# Patient Record
Sex: Male | Born: 1965 | Race: White | Hispanic: No | Smoking: Never smoker
Health system: Southern US, Community
[De-identification: ages and names within clinical notes are randomized; demographics above are authoritative.]

## PROBLEM LIST (undated history)

## (undated) DIAGNOSIS — S43432A Superior glenoid labrum lesion of left shoulder, initial encounter: Secondary | ICD-10-CM

## (undated) DIAGNOSIS — F429 Obsessive-compulsive disorder, unspecified: Secondary | ICD-10-CM

## (undated) DIAGNOSIS — F329 Major depressive disorder, single episode, unspecified: Secondary | ICD-10-CM

## (undated) DIAGNOSIS — F32A Depression, unspecified: Secondary | ICD-10-CM

## (undated) DIAGNOSIS — K5792 Diverticulitis of intestine, part unspecified, without perforation or abscess without bleeding: Secondary | ICD-10-CM

## (undated) DIAGNOSIS — F419 Anxiety disorder, unspecified: Secondary | ICD-10-CM

## (undated) DIAGNOSIS — T7840XA Allergy, unspecified, initial encounter: Secondary | ICD-10-CM

## (undated) HISTORY — DX: Obsessive-compulsive disorder, unspecified: F42.9

## (undated) HISTORY — DX: Depression, unspecified: F32.A

## (undated) HISTORY — PX: COLONOSCOPY: SHX174

## (undated) HISTORY — DX: Allergy, unspecified, initial encounter: T78.40XA

## (undated) HISTORY — DX: Anxiety disorder, unspecified: F41.9

## (undated) HISTORY — DX: Major depressive disorder, single episode, unspecified: F32.9

---
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
## 2007-02-17 ENCOUNTER — Ambulatory Visit: Payer: Self-pay | Admitting: Internal Medicine

## 2007-02-17 ENCOUNTER — Inpatient Hospital Stay (HOSPITAL_COMMUNITY): Admission: EM | Admit: 2007-02-17 | Discharge: 2007-02-19 | Payer: Self-pay | Admitting: Emergency Medicine

## 2007-07-13 HISTORY — PX: PARTIAL COLECTOMY: SHX5273

## 2007-09-11 ENCOUNTER — Emergency Department (HOSPITAL_COMMUNITY): Admission: EM | Admit: 2007-09-11 | Discharge: 2007-09-11 | Payer: Self-pay | Admitting: Family Medicine

## 2008-07-29 ENCOUNTER — Ambulatory Visit (HOSPITAL_COMMUNITY): Admission: RE | Admit: 2008-07-29 | Discharge: 2008-07-29 | Payer: Self-pay | Admitting: Infectious Diseases

## 2010-05-04 ENCOUNTER — Ambulatory Visit: Payer: Self-pay | Admitting: Sports Medicine

## 2010-05-04 DIAGNOSIS — M722 Plantar fascial fibromatosis: Secondary | ICD-10-CM | POA: Insufficient documentation

## 2010-05-04 DIAGNOSIS — M79609 Pain in unspecified limb: Secondary | ICD-10-CM | POA: Insufficient documentation

## 2010-05-04 DIAGNOSIS — M2020 Hallux rigidus, unspecified foot: Secondary | ICD-10-CM | POA: Insufficient documentation

## 2010-06-09 ENCOUNTER — Ambulatory Visit: Payer: Self-pay | Admitting: Sports Medicine

## 2010-08-11 NOTE — Assessment & Plan Note (Signed)
Summary: f/u foot,mc   Vital Signs:  Patient profile:   45 year old male Pulse rate:   77 / minute BP sitting:   122 / 72  (right arm)  Vitals Entered By: Rochele Pages RN (June 09, 2010 8:40 AM) CC: f/u L foot - 80% improved   CC:  f/u L foot - 80% improved.  History of Present Illness: Pt reports to clinic for f/u of L plantar fascitis which he reports is 80% improved.  Reports that he is still wearing tennis shoes for work, wore the boot for several weeks.  Did not try sports insole orthotics.   Has been able to reduce ice baths, doing heel lift and drop exercises.  Had injection 05/18/10 by Dr. Luiz Blare, which helped with pain.    original injury was with soccer sharp pain scan showed some nodular change in membrane as well as chronic thickening   Preventive Screening-Counseling & Management  Alcohol-Tobacco     Smoking Status: never  Allergies: No Known Drug Allergies  Social History: Smoking Status:  never  Physical Exam  General:  Well-developed,well-nourished,in no acute distress; alert,appropriate and cooperative throughout examination Msk:  Moderate arch bilat no rear foot shift  only mild TTP at insertion of Left PF Very limited motion both great toes External rotation of right foot with running walking gait neutral leg lengths equal   MSK [place] Scan shows less edema still with thick PF at 0.6 which compares to 0.33 on RT nodule is stil present but somewhat less prominent     Impression & Recommendations:  Problem # 1:  PLANTAR FASCIITIS, LEFT (ICD-728.71)  His updated medication list for this problem includes:    Meloxicam 15 Mg Tabs (Meloxicam) .Marland Kitchen... 1 by mouth daily as needed pain   keep up exercises keep up stretches  OK to increase activity as tolerated to elliptical then TM and finally running  try insoles if comfortable - use them  reck 2 mos and repeat scan ck soccer cleats andd see if they need support  Problem # 2:  HALLUX  RIGIDUS (ICD-735.2) keep working on toe motion with toe walking  Complete Medication List: 1)  Meloxicam 15 Mg Tabs (Meloxicam) .Marland Kitchen.. 1 by mouth daily as needed pain  Patient Instructions: 1)  Try green sports orthotics in your shoes for arch support and cushion, if foot feels worse with insole ok to remove. 2)  Continue exercises and stretches once daily if possible during work days, try to do exercises twice daily when not working 3)  When doing weight exericses, be cautious with plantar flexion 4)  Try ellipitcal first, if this does not cause pain, may try treadmill 5)  Keep pain level at no more than 3/10 6)  Return for follow up in 6 weeks   Orders Added: 1)  Est. Patient Level III [54098]

## 2010-08-11 NOTE — Assessment & Plan Note (Signed)
Summary: RT FOOT/LEG INJURY,MC   Vital Signs:  Patient profile:   45 year old male Height:      70.5 inches Weight:      178 pounds Pulse rate:   61 / minute BP sitting:   118 / 72  (right arm)  Vitals Entered By: Rochele Pages RN (May 04, 2010 11:42 AM) CC: left foot pain- heel and arch plantar aspect    CC:  left foot pain- heel and arch plantar aspect .  History of Present Illness: Heel and arch pain. Starting 8 days ago following soccer pain with heel pain.  Felt pain with getting out of bed in the morning. Wore tennis shoes during the week at work and was feeling somewhat better by the weekend. However this weekend while playing soccer game felt a painful tearing sensation in his left arch and was unable to continue playing. Today has continued pain and is limping.   Current Problems (verified): 1)  Plantar Fasciitis, Left  (ICD-728.71) 2)  Heel Pain  (ICD-729.5)  Current Medications (verified): 1)  Meloxicam 15 Mg Tabs (Meloxicam) .Marland Kitchen.. 1 By Mouth Daily As Needed Pain  Allergies (verified): No Known Drug Allergies  Social History: ID physician at [employer]  Review of Systems  The patient denies anorexia, fever, weight loss, chest pain, and abdominal pain.    Physical Exam  General:  VS noted.  Well NAD Msk:  Left foot: Normal appearing.  TTP over medial heel and mid arch.  Hallux Rigidus with limited great toe flexion and extension BL.   Normal arch with standing. Favors the medial aspect of his foot with suppination.  Able to stand on toes normally.  Gait: Limps with suppination BL.  [place]: Left plantar fascia is 0.6cm thick with a hypoechoic nodule seen at the mid foot.    Impression & Recommendations:  Problem # 1:  PLANTAR FASCIITIS, LEFT (ICD-728.71) Assessment New Plan: Temporary orthotics with scaphoid pads, arch straps, ice, and stretching exercises.  Will also treat pain with mobic as needed. Will follow up in 4-6 weeks. Plan for perm orthotics in 4  weeks if improving.   Also note hallicus rigidus. Given stretching exercises.   His updated medication list for this problem includes:    Meloxicam 15 Mg Tabs (Meloxicam) .Marland Kitchen... 1 by mouth daily as needed pain  Problem # 2:  HALLUX RIGIDUS (ICD-735.2) will need to assess gait when he has less pain  at that point can see if hallux rigidus is changing running form  if so may need to consider first ray posting for orthotics  Complete Medication List: 1)  Meloxicam 15 Mg Tabs (Meloxicam) .Marland Kitchen.. 1 by mouth daily as needed pain  Other Orders: Foot Orthosis ( Arch Strap/Heel Cup) [phone]) Sports Insoles [phone])  Patient Instructions: 1)  Thank you for seeing me today. 2)  Do the exercises that we discussed. 3)  Mobic for 7-10 days then as needed 4)  Arch straps with insols 5)  Come back in 4-6 weeks for follow up. Bring you running and soccer shoes andc shorts.  If much better call ahead for custom orthotics.  Prescriptions: MELOXICAM 15 MG TABS (MELOXICAM) 1 by mouth daily as needed pain  #30 x 1   Entered by:   Clementeen Graham MD   Authorized by:   Enid Baas MD   Signed by:   Clementeen Graham MD on 05/04/2010   Method used:   Electronically to        [facility] (retail)       [address]       [address] Shipping/mailing       [place], [place]  [address]       Ph: [phone]       Fax: [phone]   RxID:   [id number]    Orders Added: 1)  Foot Orthosis ( Arch Strap/Heel Cup) [W4132] 2)  Sports Insoles [L3510] 3)  New Patient Level II [99202]  Appended Document: RT FOOT/LEG [facility]

## 2010-11-27 NOTE — Discharge Summary (Signed)
NAME:  [name] [name]           ACCOUNT NO.:  [id number]   MEDICAL RECORD NO.:  [id number]          PATIENT TYPE:  INP   LOCATION:  5531                         FACILITY:  [facility]   PHYSICIAN:  C. Ulyess Mort, M.D.DATE OF BIRTH:  08/04/1965   DATE OF ADMISSION:  02/16/2007  DATE OF DISCHARGE:  02/19/2007                               DISCHARGE SUMMARY   DISCHARGE DIAGNOSES:  1. Community-acquired pneumonia, probably secondary to atypical.  2. Recurrent diverticulitis, status post partial colectomy in 2005.  3. Resolved acute renal failure secondary to hydration.  4. Obsessive compulsive disorder/anxiety, stable.   CONSULTATIONS:  ID, Dr. Payton Emerald, as well as Dr. Orvan Falconer.   DISCHARGE MEDICATIONS:  1. Avelox 400 mg once daily times 10 days.  2. Phenergan 25 mg 1 tablet every q.6h. p.r.n. for nausea.   HISTORY OF PRESENT ILLNESS:  The patient is a [name] [name] is a 45 year old  Caucasian male with a history of OCD as well as recurrent  diverticulitis, who came in complaining of generalized malaise and  fever.  He has been constipated and started having left lower quadrant  pain.  He did have sick contact which was his daughter who had croup and  pneumonia.  At the time of admission his temperature is 100.6, heart  rate 97, BP 124/74, saturating 100% on room air. His creatinine was  slightly elevated at 1.5.  Chest x-ray demonstrated a right upper lobe  air-space disease most likely pneumonia.  CT scan was then obtained  which had no specific findings other than the consolidation in the RUL  c/w pneumonia.   HOSPITAL COURSE:  Community-acquired pneumonia, secondary to mostly  likely atypicals.  He was first treated with azithromycin with unasyn  for empiric treatment for potential diverticulitis.  ID was consulted  while he was in the hospital.  At the time of discharge his antibiotic  therapy was consolidated to Avelox 400 mg every 10 days.  His anxiety  was treated with Klonopin.  At  the time of discharge he was doing much  better with decreased abdominal pain and with less cough.  He was to  follow up in the ID clinic at his convenience.      Hollace Hayward, M.D.  Electronically Signed      C. Ulyess Mort, M.D.  Electronically Signed    TE/MEDQ  D:  05/17/2007  T:  05/18/2007  Job:  [id number]

## 2011-01-19 ENCOUNTER — Ambulatory Visit
Admission: RE | Admit: 2011-01-19 | Discharge: 2011-01-19 | Disposition: A | Discharge status: Home / Self Care | Payer: Commercial Managed Care - PPO | Source: Ambulatory Visit | Attending: Infectious Diseases | Admitting: Infectious Diseases

## 2011-01-19 ENCOUNTER — Other Ambulatory Visit: Payer: Self-pay | Admitting: Licensed Clinical Social Worker

## 2011-01-19 DIAGNOSIS — K5792 Diverticulitis of intestine, part unspecified, without perforation or abscess without bleeding: Secondary | ICD-10-CM

## 2011-01-19 MED ORDER — IOHEXOL 300 MG/ML  SOLN
100.0000 mL | Freq: Once | INTRAMUSCULAR | Status: AC | PRN
  Administered 2011-01-19: 100 mL via INTRAVENOUS

## 2011-01-20 ENCOUNTER — Other Ambulatory Visit: Payer: Self-pay | Admitting: Infectious Diseases

## 2011-01-20 ENCOUNTER — Other Ambulatory Visit: Payer: Self-pay | Admitting: *Deleted

## 2011-01-20 ENCOUNTER — Other Ambulatory Visit: Payer: Self-pay | Admitting: Licensed Clinical Social Worker

## 2011-01-20 ENCOUNTER — Other Ambulatory Visit (INDEPENDENT_AMBULATORY_CARE_PROVIDER_SITE_OTHER): Payer: 59 | Admitting: Infectious Diseases

## 2011-01-20 DIAGNOSIS — R509 Fever, unspecified: Secondary | ICD-10-CM

## 2011-01-20 LAB — CBC WITH DIFFERENTIAL/PLATELET
Basophils Absolute: 0 10*3/uL (ref 0.0–0.1)
Eosinophils Absolute: 0.1 10*3/uL (ref 0.0–0.7)
Eosinophils Relative: 2 % (ref 0–5)
MCH: 31.9 pg (ref 26.0–34.0)
MCHC: 35.9 g/dL (ref 30.0–36.0)
MCV: 88.7 fL (ref 78.0–100.0)
Platelets: 256 10*3/uL (ref 150–400)
RDW: 12 % (ref 11.5–15.5)

## 2011-01-20 LAB — COMPREHENSIVE METABOLIC PANEL
ALT: 22 U/L (ref 0–53)
AST: 24 U/L (ref 0–37)
BUN: 6 mg/dL (ref 6–23)
CO2: 25 mEq/L (ref 19–32)
Creat: 1.02 mg/dL (ref 0.50–1.35)
Total Bilirubin: 0.3 mg/dL (ref 0.3–1.2)

## 2011-01-20 LAB — AMYLASE: Amylase: 44 U/L (ref 0–105)

## 2011-01-27 LAB — CULTURE, BLOOD (SINGLE)
Organism ID, Bacteria: NO GROWTH
Organism ID, Bacteria: NO GROWTH

## 2011-04-05 LAB — CBC
MCV: 91.6
RBC: 4.12 — ABNORMAL LOW
WBC: 13.9 — ABNORMAL HIGH

## 2011-04-05 LAB — POCT I-STAT, CHEM 8
Chloride: 101
Creatinine, Ser: 1.5
Glucose, Bld: 111 — ABNORMAL HIGH
Potassium: 4.1

## 2011-04-05 LAB — INFLUENZA A AND B ANTIGEN (CONVERTED LAB)
Inflenza A Ag: NEGATIVE
Influenza B Ag: NEGATIVE

## 2011-04-05 LAB — DIFFERENTIAL
Lymphs Abs: 0.8
Monocytes Relative: 8
Neutro Abs: 11.9 — ABNORMAL HIGH
Neutrophils Relative %: 86 — ABNORMAL HIGH

## 2011-04-26 LAB — URINALYSIS, ROUTINE W REFLEX MICROSCOPIC
Glucose, UA: NEGATIVE
Nitrite: NEGATIVE
Protein, ur: NEGATIVE
Urobilinogen, UA: 0.2

## 2011-04-26 LAB — SODIUM, URINE, RANDOM: Sodium, Ur: 132

## 2011-04-26 LAB — CBC
HCT: 34.2 — ABNORMAL LOW
HCT: 38.3 — ABNORMAL LOW
Hemoglobin: 11.8 — ABNORMAL LOW
Hemoglobin: 12.7 — ABNORMAL LOW
Hemoglobin: 13.1
MCHC: 34.2
MCHC: 35
MCV: 90.7
MCV: 91.8
RBC: 3.72 — ABNORMAL LOW
RBC: 3.99 — ABNORMAL LOW
RDW: 13.2
WBC: 6.4
WBC: 7.5

## 2011-04-26 LAB — CULTURE, BLOOD (ROUTINE X 2)

## 2011-04-26 LAB — HEPATIC FUNCTION PANEL
ALT: 27
AST: 30
Albumin: 3.2 — ABNORMAL LOW
Alkaline Phosphatase: 43
Bilirubin, Direct: 0.1
Total Bilirubin: 0.8

## 2011-04-26 LAB — BASIC METABOLIC PANEL
BUN: 7
CO2: 25
CO2: 26
CO2: 26
Calcium: 8.7
Calcium: 8.8
Chloride: 106
Chloride: 106
Creatinine, Ser: 1.34
GFR calc Af Amer: >60
GFR calc Af Amer: >60
GFR calc Af Amer: >60
GFR calc non Af Amer: 57 — ABNORMAL LOW
GFR calc non Af Amer: 59 — ABNORMAL LOW
Glucose, Bld: 94
Potassium: 3.9
Sodium: 138
Sodium: 138
Sodium: 138
Sodium: 139

## 2011-04-26 LAB — DIFFERENTIAL
Basophils Relative: 0
Basophils Relative: 0
Lymphs Abs: 1.5
Monocytes Absolute: 1 — ABNORMAL HIGH
Monocytes Relative: 10
Monocytes Relative: 14 — ABNORMAL HIGH
Neutro Abs: 4.9
Neutro Abs: 5.7
Neutrophils Relative %: 65
Neutrophils Relative %: 72

## 2011-04-26 LAB — HIV ANTIBODY (ROUTINE TESTING W REFLEX): HIV: NONREACTIVE

## 2011-04-26 LAB — LEGIONELLA ANTIGEN, URINE: Legionella Antigen, Urine: NEGATIVE

## 2011-04-26 LAB — COMPREHENSIVE METABOLIC PANEL
Alkaline Phosphatase: 45
BUN: 13
Calcium: 9.1
Glucose, Bld: 87
Total Protein: 7.3

## 2011-07-27 ENCOUNTER — Encounter (HOSPITAL_COMMUNITY): Payer: Self-pay | Admitting: Emergency Medicine

## 2011-07-27 ENCOUNTER — Emergency Department (HOSPITAL_COMMUNITY)
Admission: EM | Admit: 2011-07-27 | Discharge: 2011-07-27 | Disposition: A | Discharge status: Home / Self Care | Payer: 59 | Source: Home / Self Care | Attending: Family Medicine | Admitting: Family Medicine

## 2011-07-27 DIAGNOSIS — T7840XA Allergy, unspecified, initial encounter: Secondary | ICD-10-CM

## 2011-07-27 DIAGNOSIS — J310 Chronic rhinitis: Secondary | ICD-10-CM

## 2011-07-27 DIAGNOSIS — R059 Cough, unspecified: Secondary | ICD-10-CM

## 2011-07-27 DIAGNOSIS — R05 Cough: Secondary | ICD-10-CM

## 2011-07-27 HISTORY — DX: Diverticulitis of intestine, part unspecified, without perforation or abscess without bleeding: K57.92

## 2011-07-27 MED ORDER — FLUTICASONE PROPIONATE 50 MCG/ACT NA SUSP: 2.0000 | Freq: Every day | NASAL | Status: DC

## 2011-07-27 MED ORDER — PREDNISONE (PAK) 10 MG PO TABS: ORAL_TABLET | ORAL | Status: AC

## 2011-07-27 MED ORDER — BENZONATATE 200 MG PO CAPS: 200.0000 mg | ORAL_CAPSULE | Freq: Three times a day (TID) | ORAL | Status: AC | PRN

## 2011-07-27 NOTE — ED Provider Notes (Signed)
History     CSN: [id number]  Arrival date & time 07/27/11  1110   First MD Initiated Contact with Patient 07/27/11 1212      Chief Complaint  Patient presents with  . Oral Swelling    (Consider location/radiation/quality/duration/timing/severity/associated sxs/prior treatment) HPI Comments: [name] [name] is a pleasant 46 yo M with no significant PMH who presents for evaluation of a suspected allergic reaction. He reports swelling of his lips and around his eyes this morning. He had also been experiencing an urticarial rash over his trunk and arms over the last few days. He has been treating a persistent, dry cough over the last 2 weeks with various preparations including levofloxacin, dextromethorphan, hydrocodone/homatropine, hydrocodone/acetaminophen. He has partially treated his reaction thus far with Benadryl and some prednisone. He denies any shortness of breath or throat swelling or throat itching. 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 He denies any fever.   Patient is a 46 y.o. male presenting with allergic reaction. The history is provided by the patient.  Allergic Reaction The primary symptoms are  cough, rash, angioedema and urticaria. The primary symptoms do not include wheezing, shortness of breath, nausea, vomiting, diarrhea, dizziness, palpitations or altered mental status. The current episode started more than 2 days ago. The problem has not changed since onset.This is a new problem.  The cough began more than 1 week ago. The cough is new. The cough is non-productive.  The rash began 2 to 7 days ago. The rash appears on the torso, back, abdomen, right leg and left leg. The pain associated with the rash is moderate. The rash is associated with itching. Risk factors for rash include a change in medications.  The angioedema began 3 to 5 hours ago. The angioedema has been gradually improving since its onset. It is a new problem. It is located on the lips. The  angioedema is not associated with shortness of breath.   The urticaria began more than 2 days ago. The urticaria has been unchanged since its onset. Urticaria is a new problem. Urticaria is located on the abdomen, back, left arm and right arm.  Significant symptoms also include itching.    Past Medical History  Diagnosis Date  . Diverticulitis     Past Surgical History  Procedure Date  . Partial colectomy     History reviewed. No pertinent family history.  History  Substance Use Topics  . Smoking status: Never Smoker   . Smokeless tobacco: Not on file  . Alcohol Use: Yes      Review of Systems  Constitutional: Negative.   HENT: Positive for facial swelling. Negative for sore throat, drooling and trouble swallowing.   Eyes: Negative.   Respiratory: Positive for cough. Negative for chest tightness, shortness of breath and wheezing.   Cardiovascular: Negative.  Negative for palpitations.  Gastrointestinal: Negative.  Negative for nausea, vomiting and diarrhea.  Genitourinary: Negative.   Musculoskeletal: Negative.   Skin: Positive for itching and rash.  Neurological: Negative.  Negative for dizziness.  Hematological: Negative.   Psychiatric/Behavioral: Negative for altered mental status.    Allergies  Review of patient's allergies indicates no known allergies.  Home Medications   Current Outpatient Rx  Name Route Sig Dispense Refill  . DIPHENHYDRAMINE HCL 25 MG PO CAPS Oral Take 25 mg by mouth every 6 (six) hours as needed.    Marland Kitchen LORATADINE 10 MG PO TABS Oral Take 10 mg by mouth daily.    Marland Kitchen PREDNISONE (PAK) 10 MG  PO TABS Oral Take 10 mg by mouth daily.    Marland Kitchen BENZONATATE 200 MG PO CAPS Oral Take 1 capsule (200 mg total) by mouth 3 (three) times daily as needed for cough. 20 capsule 0  . FLUTICASONE PROPIONATE 50 MCG/ACT NA SUSP Nasal Place 2 sprays into the nose daily. 16 g 2  . PREDNISONE (PAK) 10 MG PO TABS  Take 6 tablets on day 1, 5 tablets on day 2, 4 tablets on  day 3, 3 tablets on day 4, 2 tablets on day 5, 1 tablet on day 6 21 tablet 0    BP 120/71  Pulse 76  Temp(Src) 97.8 F (36.6 C) (Oral)  Resp 16  SpO2 100%  Physical Exam  Nursing note and vitals reviewed. Constitutional: He is oriented to person, place, and time. He appears well-developed and well-nourished.  HENT:  Head: Normocephalic and atraumatic.  Right Ear: Hearing and tympanic membrane normal.  Left Ear: Hearing and tympanic membrane normal.  Mouth/Throat: Uvula is midline, oropharynx is clear and moist and mucous membranes are normal.       Mild lip edema  Eyes: Conjunctivae and EOM are normal. Pupils are equal, round, and reactive to light.       Mild periorbital edema, mild erythema  Neck: Normal range of motion.  Cardiovascular: Normal rate, regular rhythm and normal heart sounds.   No murmur heard. Pulmonary/Chest: Effort normal and breath sounds normal. He has no wheezes. He has no rhonchi. He has no rales.  Musculoskeletal: Normal range of motion.  Neurological: He is alert and oriented to person, place, and time.  Skin: Skin is warm and dry.  Psychiatric: His behavior is normal.    ED Course  Procedures (including critical care time)  Labs Reviewed - No data to display No results found.   1. Rhinitis   2. Cough   3. Allergic reaction       MDM  Given steroid dosepak, Tessalon perles, fluticasone; will continue over the counter antihistamine        Richardo Priest, MD 07/27/11 1422

## 2011-07-27 NOTE — ED Notes (Signed)
C/o today of lip swelling and eye lids swelling.  Patient has recognized rash and itching since Friday with the onset of bilateral hand swelling, has progressed with rash, spreading of rash and itchiness.  Patient unclear of what has triggered symptoms

## 2012-01-11 ENCOUNTER — Encounter: Payer: Self-pay | Admitting: Sports Medicine

## 2012-01-12 ENCOUNTER — Ambulatory Visit (INDEPENDENT_AMBULATORY_CARE_PROVIDER_SITE_OTHER): Payer: Commercial Managed Care - PPO | Admitting: Family Medicine

## 2012-01-12 ENCOUNTER — Encounter: Payer: Self-pay | Admitting: Family Medicine

## 2012-01-12 VITALS — BP 114/72 | HR 65

## 2012-01-12 DIAGNOSIS — M722 Plantar fascial fibromatosis: Secondary | ICD-10-CM

## 2012-01-12 NOTE — Progress Notes (Signed)
  Subjective:    Patient ID: [name] [name], male    DOB: 28-Oct-1965, 46 y.o.   MRN: [id number]  HPI 46 y/o male with history of left plantar fasciitis which improved with green insoles is here for custom orthotics.   Review of Systems     Objective:   Physical Exam  Transverse and longitudinal arches preserved bilaterally Mild tenderness to palpation of the left plantar fascia at the insertion Bilateral hallucis rigidus  Patient was fitted for a : standard, cushioned, semi-rigid orthotic. The orthotic was heated and afterward the patient stood on the orthotic blank positioned on the orthotic stand. The patient was positioned in subtalar neutral position and 10 degrees of ankle dorsiflexion in a weight bearing stance. After completion of molding, a stable base was applied to the orthotic blank. The blank was ground to a stable position for weight bearing. Size:10 Base: red Posting: none       Assessment & Plan:

## 2012-01-12 NOTE — Assessment & Plan Note (Signed)
Custom orthotics today.  Arch strap.  Daily stretching.  We did not add any posting today.  If he has great toe pain we will add a first ray post.

## 2013-02-13 ENCOUNTER — Encounter: Payer: Self-pay | Admitting: Family Medicine

## 2013-02-13 ENCOUNTER — Ambulatory Visit (INDEPENDENT_AMBULATORY_CARE_PROVIDER_SITE_OTHER): Payer: Commercial Managed Care - PPO | Admitting: Family Medicine

## 2013-02-13 VITALS — BP 113/68 | HR 65 | Ht 70.5 in | Wt 178.0 lb

## 2013-02-13 DIAGNOSIS — M722 Plantar fascial fibromatosis: Secondary | ICD-10-CM

## 2013-02-14 NOTE — Progress Notes (Signed)
CC: New orthotics HPI: Patient is a pleasant 47 year old male who presents for custom orthotics. He has had plantar fasciitis for the last 2-3 years. He does feel that the orthotics help some but he is not very good about doing his stretching or exercises. Unfortunately, he wore through the orthotics at the area of the MCP joint and great toe in about 3 months. 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  ROS: As above in the HPI. All other systems are stable or negative.  OBJECTIVE: APPEARANCE:  Patient in no acute distress.The patient appeared well nourished and normally developed. HEENT: No scleral icterus. Conjunctiva non-injected Resp: Non labored Skin: No rash MSK:  Foot exam:  - On inspection, there is noted to be neutral arch. Hallux rigidus present.  - When standing, patient has neutral arch. - There is no tenderness to palpation.  - Ankle range of motion is full without pain. - Strength is 5 out of 5 in ankle and foot.  - Neurovascular status normal.  ASSESSMENT: #1. Plantar fasciitis #2. Hallux rigidus   PLAN: Custom orthotics completed for the patient today.  Patient was fitted for a : standard, cushioned, semi-rigid leather soled orthotic. The orthotic was heated and afterward the patient stood on the orthotic blank positioned on the orthotic stand. The patient was positioned in subtalar neutral position and 10 degrees of ankle dorsiflexion in a weight bearing stance. After completion of molding, a stable base was applied to the orthotic blank. The blank was ground to a stable position for weight bearing. We did have to trim the edges as size 11 was slightly too big for shoe. Size: 11 Base: Leather sole Posting: None Additional orthotic padding: None

## 2013-03-05 ENCOUNTER — Ambulatory Visit (INDEPENDENT_AMBULATORY_CARE_PROVIDER_SITE_OTHER): Payer: Commercial Managed Care - PPO | Admitting: Sports Medicine

## 2013-03-05 VITALS — BP 113/70 | HR 66

## 2013-03-05 DIAGNOSIS — M722 Plantar fascial fibromatosis: Secondary | ICD-10-CM

## 2013-03-06 NOTE — Progress Notes (Signed)
Patient ID: [name] [name], male   DOB: 1966/03/20, 47 y.o.   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 His orthotics are still not quite as comfortable as he would like. He has tried changing his shoes but is still experiencing some discomfort. I recommended that we construct him a new pair of dress orthotics identical to what he had done a year ago. We will also add a first ray post bilaterally to help cushion the first MTP joint. He will followup when necessary.  Patient was fitted for a : standard, cushioned, semi-rigid orthotic. The orthotic was heated and afterward the patient stood on the orthotic blank positioned on the orthotic stand. The patient was positioned in subtalar neutral position and 10 degrees of ankle dorsiflexion in a weight bearing stance. After completion of molding, a stable base was applied to the orthotic blank. The blank was ground to a stable position for weight bearing. Size:10 dress orthotic Base: Red brick EVA Posting: B/L first ray posts Additional orthotic padding: none

## 2013-07-01 ENCOUNTER — Other Ambulatory Visit: Payer: Self-pay | Admitting: Infectious Disease

## 2013-07-01 MED ORDER — OSELTAMIVIR PHOSPHATE 75 MG PO CAPS: 75.0000 mg | ORAL_CAPSULE | Freq: Two times a day (BID) | ORAL | Status: DC

## 2013-12-07 ENCOUNTER — Other Ambulatory Visit: Payer: Self-pay | Admitting: Internal Medicine

## 2013-12-07 DIAGNOSIS — L309 Dermatitis, unspecified: Secondary | ICD-10-CM

## 2013-12-07 MED ORDER — CLOBETASOL PROPIONATE 0.05 % EX CREA: 1.0000 "application " | TOPICAL_CREAM | Freq: Two times a day (BID) | CUTANEOUS | Status: DC

## 2013-12-07 NOTE — Progress Notes (Signed)
Worsening eczema

## 2014-05-03 ENCOUNTER — Other Ambulatory Visit (INDEPENDENT_AMBULATORY_CARE_PROVIDER_SITE_OTHER): Payer: Self-pay

## 2014-05-03 ENCOUNTER — Other Ambulatory Visit: Payer: Self-pay | Admitting: Licensed Clinical Social Worker

## 2014-05-03 ENCOUNTER — Ambulatory Visit
Admission: RE | Admit: 2014-05-03 | Discharge: 2014-05-03 | Disposition: A | Discharge status: Home / Self Care | Payer: Commercial Managed Care - PPO | Source: Ambulatory Visit | Attending: Infectious Diseases | Admitting: Infectious Diseases

## 2014-05-03 ENCOUNTER — Other Ambulatory Visit: Payer: Commercial Managed Care - PPO

## 2014-05-03 ENCOUNTER — Ambulatory Visit (HOSPITAL_COMMUNITY)
Admission: RE | Admit: 2014-05-03 | Discharge: 2014-05-03 | Disposition: A | Discharge status: Home / Self Care | Payer: 59 | Source: Ambulatory Visit | Attending: General Surgery | Admitting: General Surgery

## 2014-05-03 DIAGNOSIS — K5732 Diverticulitis of large intestine without perforation or abscess without bleeding: Secondary | ICD-10-CM

## 2014-05-03 DIAGNOSIS — R1030 Lower abdominal pain, unspecified: Secondary | ICD-10-CM | POA: Diagnosis present

## 2014-05-03 DIAGNOSIS — K5730 Diverticulosis of large intestine without perforation or abscess without bleeding: Secondary | ICD-10-CM | POA: Insufficient documentation

## 2014-05-03 LAB — CBC WITH DIFFERENTIAL/PLATELET
Basophils Absolute: 0 10*3/uL (ref 0.0–0.1)
Basophils Relative: 0 % (ref 0–1)
EOS PCT: 2 % (ref 0–5)
Eosinophils Absolute: 0.1 10*3/uL (ref 0.0–0.7)
HEMATOCRIT: 37 % — AB (ref 39.0–52.0)
Hemoglobin: 12.5 g/dL — ABNORMAL LOW (ref 13.0–17.0)
LYMPHS ABS: 2 10*3/uL (ref 0.7–4.0)
LYMPHS PCT: 41 % (ref 12–46)
MCH: 31.3 pg (ref 26.0–34.0)
MCHC: 33.8 g/dL (ref 30.0–36.0)
MCV: 92.5 fL (ref 78.0–100.0)
MONO ABS: 0.5 10*3/uL (ref 0.1–1.0)
Monocytes Relative: 10 % (ref 3–12)
Neutro Abs: 2.3 10*3/uL (ref 1.7–7.7)
Neutrophils Relative %: 47 % (ref 43–77)
Platelets: 338 10*3/uL (ref 150–400)
RBC: 4 MIL/uL — ABNORMAL LOW (ref 4.22–5.81)
RDW: 12.4 % (ref 11.5–15.5)
WBC: 4.8 10*3/uL (ref 4.0–10.5)

## 2014-05-03 LAB — COMPLETE METABOLIC PANEL WITH GFR
ALT: 14 U/L (ref 0–53)
AST: 21 U/L (ref 0–37)
Albumin: 3.9 g/dL (ref 3.5–5.2)
Alkaline Phosphatase: 53 U/L (ref 39–117)
BUN: 8 mg/dL (ref 6–23)
CALCIUM: 9.8 mg/dL (ref 8.4–10.5)
CHLORIDE: 105 meq/L (ref 96–112)
CO2: 27 meq/L (ref 19–32)
CREATININE: 1.2 mg/dL (ref 0.50–1.35)
GFR, EST AFRICAN AMERICAN: 83 mL/min
GFR, Est Non African American: 72 mL/min
GLUCOSE: 95 mg/dL (ref 70–99)
Potassium: 4.5 mEq/L (ref 3.5–5.3)
Sodium: 141 mEq/L (ref 135–145)
Total Bilirubin: 0.5 mg/dL (ref 0.3–1.2)
Total Protein: 7.6 g/dL (ref 6.0–8.3)

## 2014-05-03 LAB — MAGNESIUM: MAGNESIUM: 2.1 mg/dL (ref 1.5–2.5)

## 2014-05-03 MED ORDER — IOHEXOL 300 MG/ML  SOLN
100.0000 mL | Freq: Once | INTRAMUSCULAR | Status: AC | PRN
  Administered 2014-05-03: 100 mL via INTRAVENOUS

## 2014-06-04 ENCOUNTER — Telehealth: Payer: Self-pay

## 2014-06-04 NOTE — Telephone Encounter (Signed)
-----   Message from Gatha Mayer, MD sent at 06/04/2014  1:41 PM EST ----- Regarding: needs colonoscopy Dr. Tommy Medal needs a colonoscopy to evaluate abnormal colon on CT scan  Please contact him at [phone]  He will take the 9 AM 12/15 slot  Thanks

## 2014-06-04 NOTE — Telephone Encounter (Signed)
I have scheduled him for a colonoscopy on 06/25/14 and pre-visit for 06/18/14 8:30.  He is agreeable to both appt dates and times

## 2014-06-18 ENCOUNTER — Ambulatory Visit (AMBULATORY_SURGERY_CENTER): Payer: Self-pay | Admitting: *Deleted

## 2014-06-18 VITALS — Ht 70.0 in | Wt 186.6 lb

## 2014-06-18 DIAGNOSIS — R938 Abnormal findings on diagnostic imaging of other specified body structures: Secondary | ICD-10-CM

## 2014-06-18 DIAGNOSIS — R9389 Abnormal findings on diagnostic imaging of other specified body structures: Secondary | ICD-10-CM

## 2014-06-18 NOTE — Progress Notes (Signed)
No allergies to eggs or soy. No problems with anesthesia.  Pt given Emmi instructions for colonoscopy  No oxygen use  No diet drug use  

## 2014-06-25 ENCOUNTER — Encounter: Payer: Self-pay | Admitting: Internal Medicine

## 2014-06-25 ENCOUNTER — Ambulatory Visit (AMBULATORY_SURGERY_CENTER): Payer: Commercial Managed Care - PPO | Admitting: Internal Medicine

## 2014-06-25 VITALS — BP 110/68 | HR 56 | Temp 96.2°F | Resp 17 | Ht 70.0 in | Wt 186.0 lb

## 2014-06-25 DIAGNOSIS — K5730 Diverticulosis of large intestine without perforation or abscess without bleeding: Secondary | ICD-10-CM

## 2014-06-25 DIAGNOSIS — R933 Abnormal findings on diagnostic imaging of other parts of digestive tract: Secondary | ICD-10-CM

## 2014-06-25 MED ORDER — SODIUM CHLORIDE 0.9 % IV SOLN: 500.0000 mL | INTRAVENOUS | Status: DC

## 2014-06-25 MED ORDER — AMOXICILLIN-POT CLAVULANATE 875-125 MG PO TABS: 1.0000 | ORAL_TABLET | Freq: Two times a day (BID) | ORAL | Status: DC

## 2014-06-25 NOTE — Op Note (Signed)
[place]  [address] [place], [address]   COLONOSCOPY PROCEDURE REPORT  PATIENT: [name] [name], [name] [name]  MR#: [id number] BIRTHDATE: 09/05/1965 , 48  yrs. old GENDER: male ENDOSCOPIST: Gatha Mayer, MD, [facility] PROCEDURE DATE:  06/25/2014 PROCEDURE:   Colonoscopy, diagnostic First Screening Colonoscopy - Avg.  risk and is 50 yrs.  old or older - No.  Prior Negative Screening - Now for repeat screening. N/A  History of Adenoma - Now for follow-up colonoscopy & has been > or = to 3 yrs.  N/A  Polyps Removed Today? No.  Polyps Removed Today? No.  Recommend repeat exam, <10 yrs? Polyps Removed Today? No.  Recommend repeat exam, <10 yrs? No. ASA CLASS:   Class I INDICATIONS:an abnormal CT. MEDICATIONS: Propofol 300 mg IV lidocaine 40 mg IV and Monitored anesthesia care  DESCRIPTION OF PROCEDURE:   After the risks benefits and alternatives of the procedure were thoroughly explained, informed consent was obtained.  The digital rectal exam revealed no abnormalities of the rectum, revealed the prostate was not enlarged, and revealed no prostatic nodules.   The LB [id number]  endoscope was introduced through the anus and advanced to the cecum, which was identified by both the appendix and ileocecal valve. No adverse events experienced.   The quality of the prep was excellent, using MiraLax  The instrument was then slowly withdrawn as the colon was fully examined.  COLON FINDINGS: There was moderate diverticulosis noted throughout the entire examined colon.   There was evidence of a prior end-to-end colo-colonic surgical anastomosis in the left colon. The examination was otherwise normal.  Retroflexed rectal and right colon views revealed no abnormalities. The time to cecum=1 minutes 20 seconds.  Withdrawal time=8 minutes 32 seconds.  The scope was withdrawn and the procedure completed. COMPLICATIONS: There were no immediate complications.  ENDOSCOPIC  IMPRESSION: 1.   Moderate diverticulosis was noted throughout the entire examined colon 2.   There was evidence of a prior colo-colonic surgical anastomosis in the left colon 3.   The examination was otherwise normal - excellent prep - suspect changes on CT 04/2014 were from diverticulitis (resolved)  RECOMMENDATIONS: Repeat colonoscopy 10 years. (2025/6)  eSigned:  Gatha Mayer, MD, [facility] 06/25/2014 9:43 AM   cc: The Patient

## 2014-06-25 NOTE — Progress Notes (Signed)
Stable to RR 

## 2014-06-25 NOTE — Patient Instructions (Addendum)
I saw diverticulosis throughout the colon - and the anastomosis but no other abnormalities.  Next routine colonoscopy in 10 years - 2025/6  I appreciate the opportunity to care for you. Gatha Mayer, MD, FACG      YOU HAD AN ENDOSCOPIC PROCEDURE TODAY AT [facility]: Refer to the procedure report that was given to you for any specific questions about what was found during the examination.  If the procedure report does not answer your questions, please call your gastroenterologist to clarify.  If you requested that your care partner not be given the details of your procedure findings, then the procedure report has been included in a sealed envelope for you to review at your convenience later.  YOU SHOULD EXPECT: Some feelings of bloating in the abdomen. Passage of more gas than usual.  Walking can help get rid of the air that was put into your GI tract during the procedure and reduce the bloating. If you had a lower endoscopy (such as a colonoscopy or flexible sigmoidoscopy) you may notice spotting of blood in your stool or on the toilet paper. If you underwent a bowel prep for your procedure, then you may not have a normal bowel movement for a few days.  DIET: Your first meal following the procedure should be a light meal and then it is ok to progress to your normal diet.  A half-sandwich or bowl of soup is an example of a good first meal.  Heavy or fried foods are harder to digest and may make you feel nauseous or bloated.  Likewise meals heavy in dairy and vegetables can cause extra gas to form and this can also increase the bloating.  Drink plenty of fluids but you should avoid alcoholic beverages for 24 hours.  ACTIVITY: Your care partner should take you home directly after the procedure.  You should plan to take it easy, moving slowly for the rest of the day.  You can resume normal activity the day after the procedure however you should NOT DRIVE or use heavy  machinery for 24 hours (because of the sedation medicines used during the test).    SYMPTOMS TO REPORT IMMEDIATELY: A gastroenterologist can be reached at any hour.  During normal business hours, 8:30 AM to 5:00 PM Monday through Friday, call [phone].  After hours and on weekends, please call the GI answering service at [phone] who will take a message and have the physician on call contact you.   Following lower endoscopy (colonoscopy or flexible sigmoidoscopy):  Excessive amounts of blood in the stool  Significant tenderness or worsening of abdominal pains  Swelling of the abdomen that is new, acute  Fever of 100F or higher  FOLLOW UP: If any biopsies were taken you will be contacted by phone or by letter within the next 1-3 weeks.  Call your gastroenterologist if you have not heard about the biopsies in 3 weeks.  Our staff will call the home number listed on your records the next business day following your procedure to check on you and address any questions or concerns that you may have at that time regarding the information given to you following your procedure. This is a courtesy call and so if there is no answer at the home number and we have not heard from you through the emergency physician on call, we will assume that you have returned to your regular daily activities without incident.  SIGNATURES/CONFIDENTIALITY: You and/or your care partner  have signed paperwork which will be entered into your electronic medical record.  These signatures attest to the fact that that the information above on your After Visit Summary has been reviewed and is understood.  Full responsibility of the confidentiality of this discharge information lies with you and/or your care-partner.    Resume medications.

## 2014-06-26 ENCOUNTER — Telehealth: Payer: Self-pay | Admitting: *Deleted

## 2014-06-26 NOTE — Telephone Encounter (Signed)
  Follow up Call-  Call back number 06/25/2014  Post procedure Call Back phone  # [phone]  Permission to leave phone message Yes     No answer, left message.

## 2015-07-25 DIAGNOSIS — F4322 Adjustment disorder with anxiety: Secondary | ICD-10-CM | POA: Diagnosis not present

## 2015-07-28 MED FILL — clonazePAM 1 MG TABS: 1 | 30 days supply | Qty: 60 | Fill #0

## 2015-08-08 DIAGNOSIS — F4322 Adjustment disorder with anxiety: Secondary | ICD-10-CM | POA: Diagnosis not present

## 2015-08-22 DIAGNOSIS — F4322 Adjustment disorder with anxiety: Secondary | ICD-10-CM | POA: Diagnosis not present

## 2015-08-22 MED FILL — traMADol HCL 50 MG TABS: 50 | 8 days supply | Qty: 25 | Fill #0

## 2015-09-01 MED FILL — clonazePAM 1 MG TABS: 1 | 30 days supply | Qty: 60 | Fill #2

## 2015-09-12 DIAGNOSIS — F4322 Adjustment disorder with anxiety: Secondary | ICD-10-CM | POA: Diagnosis not present

## 2015-09-25 DIAGNOSIS — F4322 Adjustment disorder with anxiety: Secondary | ICD-10-CM | POA: Diagnosis not present

## 2015-09-30 ENCOUNTER — Other Ambulatory Visit: Payer: Self-pay | Admitting: Internal Medicine

## 2015-09-30 DIAGNOSIS — Z20828 Contact with and (suspected) exposure to other viral communicable diseases: Secondary | ICD-10-CM

## 2015-09-30 MED ORDER — OSELTAMIVIR PHOSPHATE 75 MG PO CAPS: 75.0000 mg | ORAL_CAPSULE | Freq: Every day | ORAL | Status: DC

## 2015-09-30 MED FILL — OSELTAMIVIR PHOS 75 MG CAP: 75 | 10 days supply | Qty: 10 | Fill #0

## 2015-09-30 NOTE — Progress Notes (Signed)
Patient exposed to influenza. Recommend for him to take prophylaxis with oseltamivir

## 2015-10-02 DIAGNOSIS — F411 Generalized anxiety disorder: Secondary | ICD-10-CM | POA: Diagnosis not present

## 2015-10-03 DIAGNOSIS — F4322 Adjustment disorder with anxiety: Secondary | ICD-10-CM | POA: Diagnosis not present

## 2015-10-10 MED FILL — clonazePAM 1 MG TABS: 1 | 30 days supply | Qty: 60 | Fill #0

## 2015-10-15 ENCOUNTER — Other Ambulatory Visit: Payer: Self-pay | Admitting: Internal Medicine

## 2015-10-15 DIAGNOSIS — L739 Follicular disorder, unspecified: Secondary | ICD-10-CM

## 2015-10-15 MED ORDER — DOXYCYCLINE HYCLATE 100 MG PO TABS: 100.0000 mg | ORAL_TABLET | Freq: Two times a day (BID) | ORAL | Status: DC

## 2015-10-15 MED FILL — DOXYCYCLINE HYCLATE 100 MG: 100 | 10 days supply | Qty: 20 | Fill #0

## 2015-10-15 NOTE — Progress Notes (Signed)
Patient is having folliculitis concerning for localized infection. Will treat with 10d course of doxy

## 2015-10-31 DIAGNOSIS — F4322 Adjustment disorder with anxiety: Secondary | ICD-10-CM | POA: Diagnosis not present

## 2015-11-07 MED FILL — clonazePAM 1 MG TABS: 1 | 30 days supply | Qty: 60 | Fill #1

## 2015-11-13 DIAGNOSIS — F4322 Adjustment disorder with anxiety: Secondary | ICD-10-CM | POA: Diagnosis not present

## 2015-12-04 DIAGNOSIS — F4322 Adjustment disorder with anxiety: Secondary | ICD-10-CM | POA: Diagnosis not present

## 2015-12-05 ENCOUNTER — Ambulatory Visit (INDEPENDENT_AMBULATORY_CARE_PROVIDER_SITE_OTHER): Payer: 59 | Admitting: Podiatry

## 2015-12-05 ENCOUNTER — Ambulatory Visit (INDEPENDENT_AMBULATORY_CARE_PROVIDER_SITE_OTHER): Payer: 59

## 2015-12-05 ENCOUNTER — Encounter: Payer: Self-pay | Admitting: Podiatry

## 2015-12-05 VITALS — BP 114/68 | HR 77 | Resp 12

## 2015-12-05 DIAGNOSIS — M79672 Pain in left foot: Secondary | ICD-10-CM | POA: Diagnosis not present

## 2015-12-05 DIAGNOSIS — M79671 Pain in right foot: Secondary | ICD-10-CM | POA: Diagnosis not present

## 2015-12-05 DIAGNOSIS — M722 Plantar fascial fibromatosis: Secondary | ICD-10-CM

## 2015-12-05 MED FILL — clonazePAM 1 MG TABS: 1 | 30 days supply | Qty: 60 | Fill #2

## 2015-12-05 NOTE — Progress Notes (Signed)
   Subjective:    Patient ID: [name] [name], male    DOB: 07-15-1965, 50 y.o.   MRN: [id number]  HPI Chief Complaint  Patient presents with  . Plantar Fasciitis    RT FOOT HEEL IS PAINFUL.''    RT FOOT BOTTOM OF THE HEEL IS BEEN PAINFUL 6 MONTHS. FOOT IS WORSE WHEN STANDING FOR LONG TIME. TRIED STEROID INJECTION, BOOT, STRETCHING,NO RELIEF.  Review of Systems  Musculoskeletal: Positive for gait problem.       Objective:   Physical Exam        Assessment & Plan:

## 2015-12-07 NOTE — Progress Notes (Signed)
Subjective:     Patient ID: [name] [name], male   DOB: 08/08/1965, 50 y.o.   MRN: [id number]  HPI patient presents stating I have had a 5 year history of pain in my heel right over left and I have had several cortisone injections I been wearing the boot I've tried physical therapy and different soft orthotics   Review of Systems  All other systems reviewed and are negative.      Objective:   Physical Exam  Constitutional: He is oriented to person, place, and time.  Cardiovascular: Intact distal pulses.   Musculoskeletal: Normal range of motion.  Neurological: He is oriented to person, place, and time.  Skin: Skin is warm.  Nursing note and vitals reviewed.  neurovascular status was intact muscle strength adequate range of motion within normal limits with inflamed plantar fascia at the insertional point of the tendon into the calcaneus. There is mild depression of the arch and I noted on the right first MPJ there is diminished range of motion of the joint surface with a small amount of dorsal spurring noted. There was no pain within the joint with movement but it is reduced as far as motion goes. Patient's found to have good digital perfusion and is well oriented 3     Assessment:     Chronic plantar fasciitis right heel at the insertion into the calcaneus    Plan:     H&P and all conditions and x-rays reviewed with patient. I have recommended at this time shockwave therapy due to long-standing nature problem and failure to respond other treatments and patient at this time had shockwave administered utilizing a combination of the ESWT and  ePat area we were able to get to approximate 20 J of energy into the right heel. Repeat treatment in 1 week and we will continue to evaluate

## 2015-12-08 DIAGNOSIS — F4322 Adjustment disorder with anxiety: Secondary | ICD-10-CM | POA: Diagnosis not present

## 2015-12-12 ENCOUNTER — Ambulatory Visit: Payer: 59

## 2015-12-12 DIAGNOSIS — M722 Plantar fascial fibromatosis: Secondary | ICD-10-CM

## 2015-12-12 NOTE — Progress Notes (Signed)
Subjective:     Patient ID: [name] [name], male   DOB: 1966-07-06, 50 y.o.   MRN: [id number]  HPI patient presents stating he has noticed a small improvement in plantar fascial pain in his Rt foot   Review of Systems  All other systems reviewed and are negative.      Objective:   Physical Exam  Pain upon palpation on mid/medial plantar fascial band in Rt heel      Assessment:     Chronic plantar fasciitis right heel at the insertion into the calcaneus  Plan:    ESWT therapy administered to Rt heel at 28 joules, 0.40 energy for 3000 pulses, tolerated well, EPAT therapy administered to surrounding connective tissues for 3000 pulses. Advised against use of NSAIDS and ice. Re-appointed in 1 week for 3rd treatment

## 2015-12-19 ENCOUNTER — Ambulatory Visit: Payer: 59

## 2015-12-19 DIAGNOSIS — M722 Plantar fascial fibromatosis: Secondary | ICD-10-CM

## 2015-12-19 DIAGNOSIS — F4322 Adjustment disorder with anxiety: Secondary | ICD-10-CM | POA: Diagnosis not present

## 2015-12-19 NOTE — Progress Notes (Signed)
Subjective:     Patient ID: [name] [name], male   DOB: 1965-07-30, 50 y.o.   MRN: [id number]  HPI patient presents stating he has noticed a small improvement in plantar fascial pain in his Rt foot   Review of Systems  All other systems reviewed and are negative.      Objective:   Physical Exam  Pain upon palpation on mid/medial plantar fascial band in Rt heel      Assessment:     Chronic plantar fasciitis right heel at the insertion into the calcaneus  Plan:    ESWT therapy administered to Rt heel at 28 joules, 0.40 energy for 3000 pulses, tolerated well, EPAT therapy administered to surrounding connective tissues for 3000 pulses. Advised against use of NSAIDS and ice. Re-appointed in 4 weeks for re-evaluation

## 2015-12-26 DIAGNOSIS — F4322 Adjustment disorder with anxiety: Secondary | ICD-10-CM | POA: Diagnosis not present

## 2016-01-05 MED FILL — clonazePAM 1 MG TABS: 1 | 20 days supply | Qty: 60 | Fill #0

## 2016-01-16 ENCOUNTER — Encounter: Payer: Self-pay | Admitting: Podiatry

## 2016-01-16 ENCOUNTER — Ambulatory Visit (INDEPENDENT_AMBULATORY_CARE_PROVIDER_SITE_OTHER): Payer: 59 | Admitting: Podiatry

## 2016-01-16 VITALS — BP 102/62 | HR 88 | Resp 12

## 2016-01-16 DIAGNOSIS — F4322 Adjustment disorder with anxiety: Secondary | ICD-10-CM | POA: Diagnosis not present

## 2016-01-16 DIAGNOSIS — M722 Plantar fascial fibromatosis: Secondary | ICD-10-CM

## 2016-01-18 NOTE — Progress Notes (Signed)
Subjective:     Patient ID: [name] [name], male   DOB: September 11, 1965, 50 y.o.   MRN: [id number]  HPI patient states my heel still hurts right over left and I don't think there is been very much improvement so far with shockwave   Review of Systems     Objective:   Physical Exam Neurovascular status intact muscle strength adequate with continued discomfort plantar aspect of the heel mostly located on the medial side at the insertion right over left with moderate pain in the arch which I believe is compensation in nature    Assessment:     Chronic plantar fasciitis right over left of at least 5 year duration and it affects his ability to be active or do his job at certain times    Plan:     H&P and condition reviewed at great length. I do think there is a consideration for endoscopic release of the medial band and I spent a great deal of time going over this with him and explaining the procedure and what would be required. Patient would like to go for this approach and at this time I went ahead and discussed: Intervention and what the recovery with localized. We did try shockwave one more time today and he will think and decide on when it is best for him to pursue surgical intervention if symptoms do not stabilize

## 2016-01-23 DIAGNOSIS — F4322 Adjustment disorder with anxiety: Secondary | ICD-10-CM | POA: Diagnosis not present

## 2016-01-26 DIAGNOSIS — F411 Generalized anxiety disorder: Secondary | ICD-10-CM | POA: Diagnosis not present

## 2016-02-10 MED FILL — clonazePAM 1 MG TABS: 1 | 30 days supply | Qty: 60 | Fill #1

## 2016-02-13 DIAGNOSIS — F4322 Adjustment disorder with anxiety: Secondary | ICD-10-CM | POA: Diagnosis not present

## 2016-02-27 DIAGNOSIS — F4322 Adjustment disorder with anxiety: Secondary | ICD-10-CM | POA: Diagnosis not present

## 2016-03-10 DIAGNOSIS — M25512 Pain in left shoulder: Secondary | ICD-10-CM | POA: Diagnosis not present

## 2016-03-16 MED FILL — clonazePAM 1 MG TABS: 1 | 30 days supply | Qty: 60 | Fill #2

## 2016-03-18 DIAGNOSIS — H5203 Hypermetropia, bilateral: Secondary | ICD-10-CM | POA: Diagnosis not present

## 2016-03-18 DIAGNOSIS — H52223 Regular astigmatism, bilateral: Secondary | ICD-10-CM | POA: Diagnosis not present

## 2016-03-18 DIAGNOSIS — H524 Presbyopia: Secondary | ICD-10-CM | POA: Diagnosis not present

## 2016-03-19 ENCOUNTER — Encounter: Payer: Self-pay | Admitting: Podiatry

## 2016-03-19 ENCOUNTER — Ambulatory Visit (INDEPENDENT_AMBULATORY_CARE_PROVIDER_SITE_OTHER): Payer: 59 | Admitting: Podiatry

## 2016-03-19 DIAGNOSIS — M722 Plantar fascial fibromatosis: Secondary | ICD-10-CM | POA: Diagnosis not present

## 2016-03-19 MED ORDER — TRIAMCINOLONE ACETONIDE 10 MG/ML IJ SUSP
10.0000 mg | Freq: Once | INTRAMUSCULAR | Status: AC
  Administered 2016-03-19: 10 mg

## 2016-03-21 NOTE — Progress Notes (Signed)
Subjective:     Patient ID: [name] [name], male   DOB: [birth year], 50 y.o.   MRN: [id number]  HPI patient states I seem to be gradually improving but I still have pain in my right heel and I'm trying to increase activity levels   Review of Systems     Objective:   Physical Exam Neurovascular status intact with diminishment of discomfort plantar heel right over left with what appears to be improvement utilization of shockwave therapy    Assessment:     Plantar fasciitis still present resistant nature right over left with moderate improvement with shockwave therapy    Plan:     Reviewed condition at great length and discussed continued gradual increase in activities but not do anything that is of an intense element. Patient did have one injection administered right 3 mg Kenalog 5 mg Xylocaine and may have to consider surgical intervention still at one point in future if symptoms get worse

## 2016-03-30 DIAGNOSIS — F4322 Adjustment disorder with anxiety: Secondary | ICD-10-CM | POA: Diagnosis not present

## 2016-04-09 DIAGNOSIS — F4322 Adjustment disorder with anxiety: Secondary | ICD-10-CM | POA: Diagnosis not present

## 2016-04-22 DIAGNOSIS — F4322 Adjustment disorder with anxiety: Secondary | ICD-10-CM | POA: Diagnosis not present

## 2016-04-27 MED FILL — clonazePAM 1 MG TABS: 1 | 90 days supply | Qty: 135 | Fill #0

## 2016-04-28 DIAGNOSIS — M25512 Pain in left shoulder: Secondary | ICD-10-CM | POA: Diagnosis not present

## 2016-04-30 ENCOUNTER — Ambulatory Visit: Payer: 59 | Admitting: Podiatry

## 2016-05-03 MED FILL — MELOXICAM 15 MG TABLET: 15 | 30 days supply | Qty: 30 | Fill #0

## 2016-05-05 MED FILL — MUPIROCIN 2% OINTMENT: 2 | 20 days supply | Qty: 22 | Fill #0

## 2016-05-05 MED FILL — DOXYCYCLINE HYCLATE 100 MG: 100 | 10 days supply | Qty: 20 | Fill #0

## 2016-05-06 ENCOUNTER — Other Ambulatory Visit: Payer: Self-pay | Admitting: Orthopedic Surgery

## 2016-05-06 ENCOUNTER — Encounter (HOSPITAL_BASED_OUTPATIENT_CLINIC_OR_DEPARTMENT_OTHER): Payer: Self-pay | Admitting: *Deleted

## 2016-05-13 ENCOUNTER — Encounter (HOSPITAL_BASED_OUTPATIENT_CLINIC_OR_DEPARTMENT_OTHER)
Admission: RE | Disposition: A | Discharge status: Home / Self Care | Payer: Self-pay | Source: Ambulatory Visit | Attending: Orthopedic Surgery

## 2016-05-13 ENCOUNTER — Ambulatory Visit (HOSPITAL_BASED_OUTPATIENT_CLINIC_OR_DEPARTMENT_OTHER): Payer: 59 | Admitting: Anesthesiology

## 2016-05-13 ENCOUNTER — Ambulatory Visit (HOSPITAL_BASED_OUTPATIENT_CLINIC_OR_DEPARTMENT_OTHER)
Admission: RE | Admit: 2016-05-13 | Discharge: 2016-05-13 | Disposition: A | Discharge status: Home / Self Care | Payer: 59 | Source: Ambulatory Visit | Attending: Orthopedic Surgery | Admitting: Orthopedic Surgery

## 2016-05-13 ENCOUNTER — Encounter (HOSPITAL_BASED_OUTPATIENT_CLINIC_OR_DEPARTMENT_OTHER): Payer: Self-pay | Admitting: *Deleted

## 2016-05-13 DIAGNOSIS — F429 Obsessive-compulsive disorder, unspecified: Secondary | ICD-10-CM | POA: Diagnosis not present

## 2016-05-13 DIAGNOSIS — M24412 Recurrent dislocation, left shoulder: Secondary | ICD-10-CM | POA: Insufficient documentation

## 2016-05-13 DIAGNOSIS — S43005A Unspecified dislocation of left shoulder joint, initial encounter: Secondary | ICD-10-CM | POA: Diagnosis not present

## 2016-05-13 DIAGNOSIS — Z800 Family history of malignant neoplasm of digestive organs: Secondary | ICD-10-CM | POA: Diagnosis not present

## 2016-05-13 DIAGNOSIS — M7552 Bursitis of left shoulder: Secondary | ICD-10-CM | POA: Insufficient documentation

## 2016-05-13 DIAGNOSIS — Z885 Allergy status to narcotic agent status: Secondary | ICD-10-CM | POA: Diagnosis not present

## 2016-05-13 DIAGNOSIS — S43432A Superior glenoid labrum lesion of left shoulder, initial encounter: Secondary | ICD-10-CM | POA: Diagnosis not present

## 2016-05-13 DIAGNOSIS — M24112 Other articular cartilage disorders, left shoulder: Secondary | ICD-10-CM | POA: Diagnosis not present

## 2016-05-13 DIAGNOSIS — M71012 Abscess of bursa, left shoulder: Secondary | ICD-10-CM | POA: Diagnosis not present

## 2016-05-13 DIAGNOSIS — Z888 Allergy status to other drugs, medicaments and biological substances status: Secondary | ICD-10-CM | POA: Insufficient documentation

## 2016-05-13 DIAGNOSIS — G8918 Other acute postprocedural pain: Secondary | ICD-10-CM | POA: Diagnosis not present

## 2016-05-13 DIAGNOSIS — F419 Anxiety disorder, unspecified: Secondary | ICD-10-CM | POA: Diagnosis not present

## 2016-05-13 DIAGNOSIS — X58XXXA Exposure to other specified factors, initial encounter: Secondary | ICD-10-CM | POA: Diagnosis not present

## 2016-05-13 HISTORY — DX: Superior glenoid labrum lesion of left shoulder, initial encounter: S43.432A

## 2016-05-13 HISTORY — PX: SHOULDER ARTHROSCOPY WITH BANKART REPAIR: SHX5673

## 2016-05-13 SURGERY — SHOULDER ARTHROSCOPY WITH BANKART REPAIR
Anesthesia: Regional | Site: Shoulder | Laterality: Left

## 2016-05-13 MED ORDER — PROPOFOL 10 MG/ML IV BOLUS
INTRAVENOUS | Status: DC | PRN
  Administered 2016-05-13: 200 mg via INTRAVENOUS

## 2016-05-13 MED ORDER — TRAMADOL HCL 50 MG PO TABS: 50.0000 mg | ORAL_TABLET | Freq: Four times a day (QID) | ORAL | 0 refills | Status: DC | PRN

## 2016-05-13 MED ORDER — DEXAMETHASONE SODIUM PHOSPHATE 4 MG/ML IJ SOLN
INTRAMUSCULAR | Status: DC | PRN
  Administered 2016-05-13: 10 mg via INTRAVENOUS

## 2016-05-13 MED ORDER — FENTANYL CITRATE (PF) 100 MCG/2ML IJ SOLN
INTRAMUSCULAR | Status: AC
  Filled 2016-05-13: qty 2

## 2016-05-13 MED ORDER — LIDOCAINE 2% (20 MG/ML) 5 ML SYRINGE
INTRAMUSCULAR | Status: AC
  Filled 2016-05-13: qty 5

## 2016-05-13 MED ORDER — OXYCODONE HCL 5 MG PO TABS: 5.0000 mg | ORAL_TABLET | ORAL | 0 refills | Status: DC | PRN

## 2016-05-13 MED ORDER — ONDANSETRON HCL 4 MG/2ML IJ SOLN
INTRAMUSCULAR | Status: DC | PRN
  Administered 2016-05-13: 4 mg via INTRAVENOUS

## 2016-05-13 MED ORDER — ARTIFICIAL TEARS OP OINT
TOPICAL_OINTMENT | OPHTHALMIC | Status: AC
  Filled 2016-05-13: qty 3.5

## 2016-05-13 MED ORDER — ONDANSETRON HCL 4 MG/2ML IJ SOLN
INTRAMUSCULAR | Status: AC
  Filled 2016-05-13: qty 2

## 2016-05-13 MED ORDER — SUCCINYLCHOLINE CHLORIDE 20 MG/ML IJ SOLN
INTRAMUSCULAR | Status: DC | PRN
  Administered 2016-05-13: 100 mg via INTRAVENOUS

## 2016-05-13 MED ORDER — SENNA-DOCUSATE SODIUM 8.6-50 MG PO TABS: 2.0000 | ORAL_TABLET | Freq: Every day | ORAL | 1 refills | Status: DC

## 2016-05-13 MED ORDER — SODIUM CHLORIDE 0.9 % IR SOLN
Status: DC | PRN
  Administered 2016-05-13: 3000 mL

## 2016-05-13 MED ORDER — MIDAZOLAM HCL 2 MG/2ML IJ SOLN
1.0000 mg | INTRAMUSCULAR | Status: DC | PRN
  Administered 2016-05-13: 2 mg via INTRAVENOUS

## 2016-05-13 MED ORDER — SCOPOLAMINE 1 MG/3DAYS TD PT72
1.0000 | MEDICATED_PATCH | Freq: Once | TRANSDERMAL | Status: DC | PRN
Start: 2016-05-13 — End: 2016-05-13

## 2016-05-13 MED ORDER — FENTANYL CITRATE (PF) 100 MCG/2ML IJ SOLN
50.0000 ug | INTRAMUSCULAR | Status: DC | PRN
  Administered 2016-05-13 (×2): 100 ug via INTRAVENOUS

## 2016-05-13 MED ORDER — MIDAZOLAM HCL 2 MG/2ML IJ SOLN
INTRAMUSCULAR | Status: AC
Start: 2016-05-13 — End: 2016-05-13
  Filled 2016-05-13: qty 2

## 2016-05-13 MED ORDER — GLYCOPYRROLATE 0.2 MG/ML IJ SOLN: 0.2000 mg | Freq: Once | INTRAMUSCULAR | Status: DC | PRN

## 2016-05-13 MED ORDER — CEFAZOLIN SODIUM-DEXTROSE 2-4 GM/100ML-% IV SOLN
INTRAVENOUS | Status: AC
  Filled 2016-05-13: qty 100

## 2016-05-13 MED ORDER — LACTATED RINGERS IV SOLN
INTRAVENOUS | Status: DC
  Administered 2016-05-13: 10:00:00 via INTRAVENOUS
  Administered 2016-05-13: 10 mL/h via INTRAVENOUS
  Administered 2016-05-13: 14:00:00 via INTRAVENOUS

## 2016-05-13 MED ORDER — DEXAMETHASONE SODIUM PHOSPHATE 10 MG/ML IJ SOLN
INTRAMUSCULAR | Status: AC
Start: 2016-05-13 — End: 2016-05-13
  Filled 2016-05-13: qty 1

## 2016-05-13 MED ORDER — FENTANYL CITRATE (PF) 100 MCG/2ML IJ SOLN: 25.0000 ug | INTRAMUSCULAR | Status: DC | PRN

## 2016-05-13 MED ORDER — BACLOFEN 10 MG PO TABS: 10.0000 mg | ORAL_TABLET | Freq: Three times a day (TID) | ORAL | 0 refills | Status: DC

## 2016-05-13 MED ORDER — ONDANSETRON HCL 4 MG/2ML IJ SOLN: 4.0000 mg | Freq: Once | INTRAMUSCULAR | Status: DC | PRN

## 2016-05-13 MED ORDER — VANCOMYCIN HCL IN DEXTROSE 1-5 GM/200ML-% IV SOLN
INTRAVENOUS | Status: AC
  Filled 2016-05-13: qty 200

## 2016-05-13 MED ORDER — CEFAZOLIN SODIUM-DEXTROSE 2-4 GM/100ML-% IV SOLN
2.0000 g | INTRAVENOUS | Status: AC
  Administered 2016-05-13: 2 g via INTRAVENOUS

## 2016-05-13 MED ORDER — ONDANSETRON HCL 4 MG PO TABS: 4.0000 mg | ORAL_TABLET | Freq: Three times a day (TID) | ORAL | 0 refills | Status: DC | PRN

## 2016-05-13 MED ORDER — VANCOMYCIN HCL IN DEXTROSE 1-5 GM/200ML-% IV SOLN
1000.0000 mg | INTRAVENOUS | Status: AC
  Administered 2016-05-13: 1000 mg via INTRAVENOUS

## 2016-05-13 MED ORDER — SUCCINYLCHOLINE CHLORIDE 200 MG/10ML IV SOSY
PREFILLED_SYRINGE | INTRAVENOUS | Status: AC
  Filled 2016-05-13: qty 10

## 2016-05-13 MED FILL — oxyCODONE HCL 5 MG TABS: 5 | 3 days supply | Qty: 30 | Fill #0

## 2016-05-13 MED FILL — traMADol HCL 50 MG TABS: 50 | 7 days supply | Qty: 30 | Fill #0

## 2016-05-13 MED FILL — BACLOFEN 10 MG TABLET: 10 | 16 days supply | Qty: 50 | Fill #0

## 2016-05-13 MED FILL — ONDANSETRON HCL 4 MG TABLET: 4 | 10 days supply | Qty: 30 | Fill #0

## 2016-05-13 SURGICAL SUPPLY — 68 items
ANCHOR SUT BIOCOMP LK 2.9X12.5 (Anchor) ×3 IMPLANT
BLADE CUTTER GATOR 3.5 (BLADE) ×3 IMPLANT
BLADE GREAT WHITE 4.2 (BLADE) IMPLANT
BLADE GREAT WHITE 4.2MM (BLADE)
BLADE SURG 15 STRL LF DISP TIS (BLADE) IMPLANT
BLADE SURG 15 STRL SS (BLADE)
BUR OVAL 6.0 (BURR) IMPLANT
CANNULA 5.75X71 LONG (CANNULA) IMPLANT
CANNULA TWIST IN 8.25X7CM (CANNULA) IMPLANT
CANNULA TWIST IN 8.25X9CM (CANNULA) IMPLANT
CLOSURE STERI-STRIP 1/2X4 (GAUZE/BANDAGES/DRESSINGS) ×1
CLSR STERI-STRIP ANTIMIC 1/2X4 (GAUZE/BANDAGES/DRESSINGS) ×2 IMPLANT
DECANTER SPIKE VIAL GLASS SM (MISCELLANEOUS) IMPLANT
DRAPE IMP U-DRAPE 54X76 (DRAPES) ×3 IMPLANT
DRAPE INCISE IOBAN 66X45 STRL (DRAPES) ×3 IMPLANT
DRAPE SHOULDER BEACH CHAIR (DRAPES) ×3 IMPLANT
DRAPE U-SHAPE 47X51 STRL (DRAPES) ×3 IMPLANT
DRSG PAD ABDOMINAL 8X10 ST (GAUZE/BANDAGES/DRESSINGS) ×3 IMPLANT
DRSG TEGADERM 4X4.75 (GAUZE/BANDAGES/DRESSINGS) ×3 IMPLANT
DURAPREP 26ML APPLICATOR (WOUND CARE) ×3 IMPLANT
ELECT REM PT RETURN 9FT ADLT (ELECTROSURGICAL)
ELECTRODE REM PT RTRN 9FT ADLT (ELECTROSURGICAL) IMPLANT
FIBERSTICK 2 (SUTURE) IMPLANT
GAUZE SPONGE 4X4 12PLY STRL (GAUZE/BANDAGES/DRESSINGS) ×3 IMPLANT
GLOVE BIO SURGEON STRL SZ 6.5 (GLOVE) ×2 IMPLANT
GLOVE BIO SURGEON STRL SZ8 (GLOVE) ×3 IMPLANT
GLOVE BIO SURGEONS STRL SZ 6.5 (GLOVE) ×1
GLOVE BIOGEL PI IND STRL 7.0 (GLOVE) ×2 IMPLANT
GLOVE BIOGEL PI IND STRL 8 (GLOVE) ×2 IMPLANT
GLOVE BIOGEL PI INDICATOR 7.0 (GLOVE) ×4
GLOVE BIOGEL PI INDICATOR 8 (GLOVE) ×4
GLOVE ORTHO TXT STRL SZ7.5 (GLOVE) ×6 IMPLANT
GOWN STRL REUS W/ TWL LRG LVL3 (GOWN DISPOSABLE) ×1 IMPLANT
GOWN STRL REUS W/ TWL XL LVL3 (GOWN DISPOSABLE) ×2 IMPLANT
GOWN STRL REUS W/TWL LRG LVL3 (GOWN DISPOSABLE) ×2
GOWN STRL REUS W/TWL XL LVL3 (GOWN DISPOSABLE) ×4
IMMOBILIZER SHOULDER FOAM XLGE (SOFTGOODS) ×3 IMPLANT
IV NS IRRIG 3000ML ARTHROMATIC (IV SOLUTION) ×6 IMPLANT
KIT PUSHLOCK 2.9 HIP (KITS) IMPLANT
KIT SHOULDER TRACTION (DRAPES) ×3 IMPLANT
LASSO 90 CVE QUICKPAS (DISPOSABLE) ×3 IMPLANT
LASSO CRESCENT QUICKPASS (SUTURE) ×3 IMPLANT
MANIFOLD NEPTUNE II (INSTRUMENTS) ×3 IMPLANT
PACK ARTHROSCOPY DSU (CUSTOM PROCEDURE TRAY) ×3 IMPLANT
PACK BASIN DAY SURGERY FS (CUSTOM PROCEDURE TRAY) ×3 IMPLANT
SET ARTHROSCOPY TUBING (MISCELLANEOUS) ×2
SET ARTHROSCOPY TUBING LN (MISCELLANEOUS) ×1 IMPLANT
SHEET MEDIUM DRAPE 40X70 STRL (DRAPES) ×3 IMPLANT
SLEEVE SCD COMPRESS KNEE MED (MISCELLANEOUS) ×3 IMPLANT
SLING ARM FOAM STRAP LRG (SOFTGOODS) IMPLANT
SLING ARM IMMOBILIZER LRG (SOFTGOODS) IMPLANT
SLING ARM IMMOBILIZER MED (SOFTGOODS) IMPLANT
SLING ARM MED ADULT FOAM STRAP (SOFTGOODS) IMPLANT
SLING ARM XL FOAM STRAP (SOFTGOODS) IMPLANT
SUT FIBERWIRE #2 38 T-5 BLUE (SUTURE)
SUT MNCRL AB 4-0 PS2 18 (SUTURE) IMPLANT
SUT PDS AB 1 CT  36 (SUTURE)
SUT PDS AB 1 CT 36 (SUTURE) IMPLANT
SUT TIGER TAPE 7 IN WHITE (SUTURE) IMPLANT
SUT VIC AB 3-0 SH 27 (SUTURE)
SUT VIC AB 3-0 SH 27X BRD (SUTURE) IMPLANT
SUTURE FIBERWR #2 38 T-5 BLUE (SUTURE) IMPLANT
TAPE FIBER 2MM 7IN #2 BLUE (SUTURE) IMPLANT
TAPE LABRALWHITE 1.5X36 (TAPE) IMPLANT
TAPE SUT LABRALTAP WHT/BLK (SUTURE) ×3 IMPLANT
TOWEL OR 17X24 6PK STRL BLUE (TOWEL DISPOSABLE) ×3 IMPLANT
TOWEL OR NON WOVEN STRL DISP B (DISPOSABLE) ×6 IMPLANT
WATER STERILE IRR 1000ML POUR (IV SOLUTION) ×3 IMPLANT

## 2016-05-13 NOTE — H&P (Signed)
PREOPERATIVE H&P  Chief Complaint: left shoulder pain  HPI: [name] [name] is a 50 y.o. male who presents for preoperative history and physical with a diagnosis of left shoulder posterior labral tear. Symptoms are rated as moderate to severe, and have been worsening.  This is significantly impairing activities of daily living.  He has elected for surgical management. This began about 3 months ago while weight lifting.  Pain moderate to severe located posteriorly.    Past Medical History:  Diagnosis Date  . Allergy   . Anxiety   . Diverticulitis   . OCD (obsessive compulsive disorder)    Past Surgical History:  Procedure Laterality Date  . COLONOSCOPY    . PARTIAL COLECTOMY  2009   for diverticulitis  . Bennett EXTRACTION  1988   Social History   Social History  . Marital status: Married    Spouse name: N/A  . Number of children: N/A  . Years of education: N/A   Social History Main Topics  . Smoking status: Never Smoker  . Smokeless tobacco: Never Used  . Alcohol use 2.4 oz/week    4 Cans of beer per week  . Drug use: No  . Sexual activity: Not Asked   Other Topics Concern  . None   Social History Narrative  . None   Family History  Problem Relation Age of Onset  . Colon cancer Neg Hx    Allergies  Allergen Reactions  . Dextromethorphan Hives and Swelling  . Hydrocodone-Acetaminophen Hives   Prior to Admission medications   Medication Sig Start Date End Date Taking? Authorizing Provider  clonazePAM (KLONOPIN) 1 MG tablet Take 1 mg by mouth at bedtime.   Yes Historical Provider, MD  doxycycline (VIBRAMYCIN) 100 MG capsule Take 100 mg by mouth once.   Yes Historical Provider, MD  Multiple Vitamin (MULTIVITAMIN) tablet Take 1 tablet by mouth daily.   Yes Historical Provider, MD     Positive ROS: All other systems have been reviewed and were otherwise negative with the exception of those mentioned in the HPI and as above.  Physical Exam: General:  Alert, no acute distress Cardiovascular: No pedal edema Respiratory: No cyanosis, no use of accessory musculature GI: No organomegaly, abdomen is soft and non-tender Skin: No lesions in the area of chief complaint Neurologic: Sensation intact distally Psychiatric: Patient is competent for consent with normal mood and affect Lymphatic: No axillary or cervical lymphadenopathy  MUSCULOSKELETAL: Left shoulder has full motion with intact cuff strength and no pain over the before meals joint with positive load and shift and posterior symptoms.  Assessment: left shoulder posterior labral tear, question impingement syndrome   Plan: Plan for Procedure(s): SHOULDER ARTHROSCOPY WITH POSTERIOR BANKART REPAIR, with debridement, left, possible acromioplasty  The risks benefits and alternatives were discussed with the patient including but not limited to the risks of nonoperative treatment, versus surgical intervention including infection, bleeding, nerve injury,  blood clots, cardiopulmonary complications, morbidity, mortality, among others, and they were willing to proceed.   [name], MD Cell [phone]   05/13/2016 12:38 PM

## 2016-05-13 NOTE — Op Note (Signed)
05/13/2016  2:20 PM  PATIENT:  [name] [name]    PRE-OPERATIVE DIAGNOSIS:  left shoulder posterior labral tear  POST-OPERATIVE DIAGNOSIS:  Same, with posterior cuff bursal inflammation  PROCEDURE:  SHOULDER ARTHROSCOPY WITH POSTERIOR BANKART REPAIR with extensive debridement, left shoulder posterior subacromial bursectomy  SURGEON:  [name], MD  PHYSICIAN ASSISTANT: Joya Gaskins, OPA-C, present and scrubbed throughout the case, critical for completion in a timely fashion, and for retraction, instrumentation, and closure.  ANESTHESIA:   General  PREOPERATIVE INDICATIONS:  [name] [name] is a  50 y.o. male with a diagnosis of left shoulder posterior subluxation who failed conservative measures and elected for surgical management.    The risks benefits and alternatives were discussed with the patient preoperatively including but not limited to the risks of infection, bleeding, nerve injury, cardiopulmonary complications, the need for revision surgery, among others, and the patient was willing to proceed.  We also discussed the risks of incomplete relief of pain.    OPERATIVE IMPLANTS: Arthrex bio composite 2.9 mm short push lock anchors x1 posteriorly with a single labral tape in a simple configuration.   OPERATIVE FINDINGS: Shoulder had actually a little bit of loss of motion with a slight release during manipulation under anesthesia, almost as if it was in an adhesive capsulitis although the capsule itself within the joint looked normal. The subscapularis, biceps pulley, undersurface of supraspinatus, and superior and anterior labrum and glenohumeral articular cartilage was all normal. The posterior labrum had cracking, consistent with a posterior tear although it was not dramatic, it was present.  From the subacromial space the CA ligament was pristine, there was some injected hemorrhagic bursa posteriorly over the cuff, which I debrided, however the cuff itself was  intact.  UNIQUE ASPECTS OF THE CASE:  The bone quality was extremely hard, and during placement of the anchor, I suspect there was a small cortical bump which elevated during placement of the anchor, it was completely covered with cartilage, but did elevate slightly. The anchor was extremely well seated and had complete fixation. I viewed the anchor directly and on, and it was not prominent, and was completely seated.  OPERATIVE PROCEDURE: The patient was brought to the operating room and placed in the supine position. General anesthesia was administered. IV antibiotics were given. General anesthesia was administered.   The upper extremity was examined and found to be grossly unstable particularly to anterior testing. The upper extremity was prepped and draped in the usual sterile fashion. The patient was in a semilateral decubitus position.  Time out was performed. Diagnostic arthroscopy was carried out the above-named findings. I also manipulated the shoulder prior to prepping.  I debrided the superior labrum slightly, as well as portions of the posterior labrum.  I went to the subacromial space, evaluated the CA ligament, performed bursectomy, and found that the rotator cuff was intact, and went back into the joint healing from anteriorly.  I placed 2 posterior cannulas, and then mobilized the labrum gently off of the posterior face of the glenoid with the spatula.   I then used a suture passer to pass a simple labral fiber tape across the area of maximal labral damage. This had excellent purchase on the tissue.   I anchored the anterior inferior glenohumeral ligament into the glenoid using a push lock anchor.   Excellent soft tissue restoration of tension was achieved, and the arthroscopic cannulas were removed, and the portals closed with Monocryl followed by Steri-Strips and sterile gauze.  The patient was awakened and returned to the PACU in stable and satisfactory condition. There were no  complications and the patient tolerated the procedure well.

## 2016-05-13 NOTE — Transfer of Care (Signed)
Immediate Anesthesia Transfer of Care Note  Patient: [name] [name]  Procedure(s) Performed: Procedure(s) with comments: SHOULDER ARTHROSCOPY WITH POSTERIOR BANKART REPAIR with extensive debridement, left bursectomy (Left) - pre/Post Op Scalene block  Patient Location: PACU  Anesthesia Type:GA combined with regional for post-op pain  Level of Consciousness: sedated  Airway & Oxygen Therapy: Patient Spontanous Breathing and Patient connected to face mask oxygen  Post-op Assessment: Report given to RN and Post -op Vital signs reviewed and stable  Post vital signs: Reviewed and stable  Last Vitals:  Vitals:   05/13/16 1215 05/13/16 1230  BP: 125/88 123/82  Pulse: 60 60  Resp: 16 17  Temp:      Last Pain:  Vitals:   05/13/16 0951  TempSrc: Oral  PainSc: 4       Patients Stated Pain Goal: 2 ([id number])  Complications: No apparent anesthesia complications

## 2016-05-13 NOTE — Anesthesia Preprocedure Evaluation (Signed)
Anesthesia Evaluation  Patient identified by MRN, date of birth, ID band Patient awake    Reviewed: Allergy & Precautions, NPO status , Patient's Chart, lab work & pertinent test results  Airway Mallampati: II  TM Distance: >3 FB Neck ROM: Full    Dental  (+) Teeth Intact, Dental Advisory Given   Pulmonary    breath sounds clear to auscultation       Cardiovascular  Rhythm:Regular Rate:Normal     Neuro/Psych    GI/Hepatic   Endo/Other    Renal/GU      Musculoskeletal   Abdominal   Peds  Hematology   Anesthesia Other Findings   Reproductive/Obstetrics                             Anesthesia Physical Anesthesia Plan  ASA: I  Anesthesia Plan: General and Regional   Post-op Pain Management:    Induction: Intravenous  Airway Management Planned: Oral ETT  Additional Equipment:   Intra-op Plan:   Post-operative Plan: Extubation in OR  Informed Consent: I have reviewed the patients History and Physical, chart, labs and discussed the procedure including the risks, benefits and alternatives for the proposed anesthesia with the patient or authorized representative who has indicated his/her understanding and acceptance.   Dental advisory given  Plan Discussed with: CRNA and Anesthesiologist  Anesthesia Plan Comments:         Anesthesia Quick Evaluation

## 2016-05-13 NOTE — Anesthesia Procedure Notes (Signed)
Procedure Name: Intubation Date/Time: 05/13/2016 12:58 PM Performed by: Maryella Shivers Pre-anesthesia Checklist: Patient identified, Emergency Drugs available, Suction available and Patient being monitored Patient Re-evaluated:Patient Re-evaluated prior to inductionOxygen Delivery Method: Circle system utilized Preoxygenation: Pre-oxygenation with 100% oxygen Intubation Type: IV induction Ventilation: Mask ventilation without difficulty Grade View: Grade II Tube type: Oral Tube size: 8.0 mm Number of attempts: 2 Airway Equipment and Method: Stylet and Oral airway Placement Confirmation: ETT inserted through vocal cords under direct vision,  positive ETCO2 and breath sounds checked- equal and bilateral Secured at: 22 cm Tube secured with: Tape Dental Injury: Teeth and Oropharynx as per pre-operative assessment

## 2016-05-13 NOTE — Progress Notes (Signed)
Assisted Dr. Joslin with left, ultrasound guided, interscalene  block. Side rails up, monitors on throughout procedure. See vital signs in flow sheet. Tolerated Procedure well. 

## 2016-05-13 NOTE — Discharge Instructions (Signed)
Diet: As you were doing prior to hospitalization   Shower:  May shower but keep the wounds dry, use an occlusive plastic wrap, NO SOAKING IN TUB.  If the bandage gets wet, change with a clean dry gauze.  If you have a splint on, leave the splint in place and keep the splint dry with a plastic bag.  Dressing:  You may change your dressing 3-5 days after surgery, unless you have a splint.  If you have a splint, then just leave the splint in place and we will change your bandages during your first follow-up appointment.    If you had hand or foot surgery, we will plan to remove your stitches in about 2 weeks in the office.  For all other surgeries, there are sticky tapes (steri-strips) on your wounds and all the stitches are absorbable.  Leave the steri-strips in place when changing your dressings, they will peel off with time, usually 2-3 weeks.  Activity:  Increase activity slowly as tolerated, but follow the weight bearing instructions below.  The rules on driving is that you can not be taking narcotics while you drive, and you must feel in control of the vehicle.    Weight Bearing:   Sling at all times except hygeine.    To prevent constipation: you may use a stool softener such as -  Colace (over the counter) 100 mg by mouth twice a day  Drink plenty of fluids (prune juice may be helpful) and high fiber foods Miralax (over the counter) for constipation as needed.    Itching:  If you experience itching with your medications, try taking only a single pain pill, or even half a pain pill at a time.  You may take up to 10 pain pills per day, and you can also use benadryl over the counter for itching or also to help with sleep.   Precautions:  If you experience chest pain or shortness of breath - call 911 immediately for transfer to the hospital emergency department!!  If you develop a fever greater that 101 F, purulent drainage from wound, increased redness or drainage from wound, or calf pain --  Call the office at [phone]                                                Follow- Up Appointment:  Please call for an appointment to be seen in 2 weeks [place] - [phone]   Regional Anesthesia Blocks  1. Numbness or the inability to move the "blocked" extremity may last from 3-48 hours after placement. The length of time depends on the medication injected and your individual response to the medication. If the numbness is not going away after 48 hours, call your surgeon.  2. The extremity that is blocked will need to be protected until the numbness is gone and the  Strength has returned. Because you cannot feel it, you will need to take extra care to avoid injury. Because it may be weak, you may have difficulty moving it or using it. You may not know what position it is in without looking at it while the block is in effect.  3. For blocks in the legs and feet, returning to weight bearing and walking needs to be done carefully. You will need to wait until the numbness is entirely gone and the strength has returned.  You should be able to move your leg and foot normally before you try and bear weight or walk. You will need someone to be with you when you first try to ensure you do not fall and possibly risk injury.  4. Bruising and tenderness at the needle site are common side effects and will resolve in a few days.  5. Persistent numbness or new problems with movement should be communicated to the surgeon or the [place] [phone] [place] [phone]).     Post Anesthesia Home Care Instructions  Activity: Get plenty of rest for the remainder of the day. A responsible adult should stay with you for 24 hours following the procedure.  For the next 24 hours, DO NOT: -Drive a car -Paediatric nurse -Drink alcoholic beverages -Take any medication unless instructed by your physician -Make any legal decisions or sign important  papers.  Meals: Start with liquid foods such as gelatin or soup. Progress to regular foods as tolerated. Avoid greasy, spicy, heavy foods. If nausea and/or vomiting occur, drink only clear liquids until the nausea and/or vomiting subsides. Call your physician if vomiting continues.  Special Instructions/Symptoms: Your throat may feel dry or sore from the anesthesia or the breathing tube placed in your throat during surgery. If this causes discomfort, gargle with warm salt water. The discomfort should disappear within 24 hours.  If you had a scopolamine patch placed behind your ear for the management of post- operative nausea and/or vomiting:  1. The medication in the patch is effective for 72 hours, after which it should be removed.  Wrap patch in a tissue and discard in the trash. Wash hands thoroughly with soap and water. 2. You may remove the patch earlier than 72 hours if you experience unpleasant side effects which may include dry mouth, dizziness or visual disturbances. 3. Avoid touching the patch. Wash your hands with soap and water after contact with the patch.

## 2016-05-13 NOTE — Anesthesia Procedure Notes (Addendum)
Anesthesia Regional Block:  Interscalene brachial plexus block  Pre-Anesthetic Checklist: ,, timeout performed, Correct Patient, Correct Site, Correct Laterality, Correct Procedure, Correct Position, site marked, Risks and benefits discussed,  Surgical consent,  Pre-op evaluation,  At surgeon's request and post-op pain management  Laterality: Left  Prep: chloraprep       Needles:  Injection technique: Single-shot  Needle Type: Stimulator Needle - 40     Needle Length: 5cm 5 cm Needle Gauge: 22 and 22 G    Additional Needles:  Procedures: ultrasound guided (picture in chart) Interscalene brachial plexus block Narrative:  Start time: 05/13/2016 10:40 AM End time: 05/13/2016 10:45 AM Injection made incrementally with aspirations every 5 mL.  Performed by: Personally   Additional Notes: 20 cc 0.75% naropin injected easily

## 2016-05-14 ENCOUNTER — Encounter (HOSPITAL_BASED_OUTPATIENT_CLINIC_OR_DEPARTMENT_OTHER): Payer: Self-pay | Admitting: Orthopedic Surgery

## 2016-05-14 NOTE — Anesthesia Postprocedure Evaluation (Signed)
Anesthesia Post Note  Patient: [name] [name]  Procedure(s) Performed: Procedure(s) (LRB): SHOULDER ARTHROSCOPY WITH POSTERIOR BANKART REPAIR with extensive debridement, left bursectomy (Left)  Patient location during evaluation: PACU Anesthesia Type: General and Regional Level of consciousness: awake and alert Pain management: pain level controlled Vital Signs Assessment: post-procedure vital signs reviewed and stable Respiratory status: spontaneous breathing, nonlabored ventilation, respiratory function stable and patient connected to nasal cannula oxygen Cardiovascular status: blood pressure returned to baseline and stable Postop Assessment: no signs of nausea or vomiting Anesthetic complications: no    Last Vitals:  Vitals:   05/13/16 1515 05/13/16 1605  BP: 135/80 140/84  Pulse: 61 (!) 55  Resp: 13 16  Temp:      Last Pain:  Vitals:   05/14/16 0921  TempSrc:   PainSc: 1                  Catalina Gravel

## 2016-05-20 ENCOUNTER — Ambulatory Visit: Payer: 59 | Admitting: Podiatry

## 2016-05-20 DIAGNOSIS — S43005A Unspecified dislocation of left shoulder joint, initial encounter: Secondary | ICD-10-CM | POA: Diagnosis not present

## 2016-05-26 DIAGNOSIS — S43005D Unspecified dislocation of left shoulder joint, subsequent encounter: Secondary | ICD-10-CM | POA: Diagnosis not present

## 2016-06-10 MED FILL — oxyCODONE HCL 5 MG TABS: 5 | 12 days supply | Qty: 50 | Fill #0

## 2016-06-18 ENCOUNTER — Ambulatory Visit (INDEPENDENT_AMBULATORY_CARE_PROVIDER_SITE_OTHER): Payer: 59 | Admitting: Podiatry

## 2016-06-18 DIAGNOSIS — M722 Plantar fascial fibromatosis: Secondary | ICD-10-CM | POA: Diagnosis not present

## 2016-06-20 NOTE — Progress Notes (Signed)
Subjective:     Patient ID: [name] [name], male   DOB: 1965/09/14, 50 y.o.   MRN: [id number]  HPI patient states he's been doing reasonably well but he has had surgery on his left shoulder some is not been as active   Review of Systems     Objective:   Physical Exam Neurovascular status intact muscle strength was adequate with patient's right plantar heel still being mildly tender when pressed deeply but improved from previous visits with patient in a sling for torn labrum of his left shoulder    Assessment:     Fasciitis right present but gradually improved with difficulty to make determination based on reduced activity levels    Plan:     Reviewed condition at great length and discussed again that this may require endoscopic-type surgery for the right but that we'll continue to hold off treat conservatively and I reviewed all the different modalities that he has at his disposal. He will reappoint as needed and will need to consider more aggressive approach depending on response

## 2016-06-28 DIAGNOSIS — M24112 Other articular cartilage disorders, left shoulder: Secondary | ICD-10-CM | POA: Diagnosis not present

## 2016-06-30 ENCOUNTER — Ambulatory Visit: Payer: 59 | Attending: Orthopedic Surgery | Admitting: Physical Therapy

## 2016-06-30 ENCOUNTER — Encounter: Payer: Self-pay | Admitting: Physical Therapy

## 2016-06-30 DIAGNOSIS — M25612 Stiffness of left shoulder, not elsewhere classified: Secondary | ICD-10-CM | POA: Insufficient documentation

## 2016-06-30 DIAGNOSIS — R600 Localized edema: Secondary | ICD-10-CM | POA: Insufficient documentation

## 2016-06-30 DIAGNOSIS — M25512 Pain in left shoulder: Secondary | ICD-10-CM | POA: Diagnosis not present

## 2016-06-30 DIAGNOSIS — M6281 Muscle weakness (generalized): Secondary | ICD-10-CM | POA: Insufficient documentation

## 2016-07-01 ENCOUNTER — Ambulatory Visit: Payer: 59 | Admitting: Physical Therapy

## 2016-07-01 DIAGNOSIS — M25512 Pain in left shoulder: Secondary | ICD-10-CM

## 2016-07-01 DIAGNOSIS — R600 Localized edema: Secondary | ICD-10-CM | POA: Diagnosis not present

## 2016-07-01 DIAGNOSIS — M6281 Muscle weakness (generalized): Secondary | ICD-10-CM

## 2016-07-01 DIAGNOSIS — M25612 Stiffness of left shoulder, not elsewhere classified: Secondary | ICD-10-CM

## 2016-07-01 NOTE — Therapy (Signed)
[place], [name], [address] Phone: [phone]   Fax:  [phone]  Physical Therapy Treatment  Patient Details  Name: [name] [name] MRN: [id number] Date of Birth: 31-Jan-1966 Referring Provider: Dr Carter Kitten   Encounter Date: 07/01/2016      PT End of Session - 07/01/16 1231    Visit Number 2   Number of Visits 16   Date for PT Re-Evaluation 08/26/16   PT Start Time 1103   PT Stop Time 1200   PT Time Calculation (min) 57 min   Activity Tolerance Patient tolerated treatment well   Behavior During Therapy [facility] for tasks assessed/performed      Past Medical History:  Diagnosis Date  . Allergy   . Anxiety   . Diverticulitis   . OCD (obsessive compulsive disorder)   . Tear of left glenoid labrum, posterior 05/13/2016    Past Surgical History:  Procedure Laterality Date  . COLONOSCOPY    . PARTIAL COLECTOMY  2009   for diverticulitis  . SHOULDER ARTHROSCOPY WITH BANKART REPAIR Left 05/13/2016   Procedure: SHOULDER ARTHROSCOPY WITH POSTERIOR BANKART REPAIR with extensive debridement, left bursectomy;  Surgeon: Marchia Bond, MD;  Location: [facility];  Service: Orthopedics;  Laterality: Left;  pre/Post Op Scalene block  . WISDOM TOOTH EXTRACTION  1988    There were no vitals filed for this visit.      Subjective Assessment - 07/01/16 1204    Subjective Patient has not been able to do his exercises,  he was just here yesterday.  Mild to moderate pain today.  He continues to have trouble sleeping at night.   Currently in Pain? Yes   Pain Score --  mild   Pain Location Shoulder   Pain Orientation Right   Pain Descriptors / Indicators Aching            [facility] PT Assessment - 07/01/16 1229      PROM   Left Shoulder Flexion 129 Degrees                     [facility] Adult PT Treatment/Exercise - 07/01/16 1224      Self-Care   Self-Care --  practiced  sleeping position 3/4 supine and 3/4 prone, rolled     Shoulder Exercises: Pulleys   Flexion 3 minutes     Vasopneumatic   Number Minutes Vasopneumatic  15 minutes   Vasopnuematic Location  Shoulder   Vasopneumatic Pressure Medium   Vasopneumatic Temperature  32     Manual Therapy   Manual therapy comments PROM of the left shoulder into pain free ranges Grade 1 and II PA and AP mobilization to improve range and decrease pain.    Edema Management lymph system vacume activation at neck, chest axilla  arm shoulder.  Able to decrease pocket of edema at elbow, posterior,  anterior chest tissue spongy and edematous.                  PT Education - 07/01/16 1230    Education provided Yes   Education Details sleeping posture,     Person(s) Educated Patient   Methods Explanation;Demonstration;Tactile cues;Verbal cues   Comprehension Verbalized understanding;Returned demonstration          PT Short Term Goals - 07/01/16 1059      PT SHORT TERM GOAL #1   Title Patient will increase left shoulder ER by 30 degrees    Baseline  18 baseline    Time 4   Period Weeks   Status New     PT SHORT TERM GOAL #2   Title Patient will report 2/10 pain at worst in left shoulder    Time 4   Period Weeks   Status New     PT SHORT TERM GOAL #3   Title Patient will increase passive flexion by 40 degrees    Baseline 88 baseline    Time 4   Period Weeks   Status New     PT SHORT TERM GOAL #4   Title Patient will be independent with initial HEP    Time 4   Period Weeks   Status New     PT SHORT TERM GOAL #5   Title Patient will demsotrate 4/5 gross left shoulder strength in available range    Time 4   Period Weeks   Status New           PT Long Term Goals - 07/01/16 1103      PT LONG TERM GOAL #1   Title Patient will reach behind back to t-8 without pain in order to perfrom ADL's    Time 8   Period Weeks   Status New     PT LONG TERM GOAL #2   Title Patient will reach  overhead into a cabinet without pain in order to perfrom IADL's    Time 8   Period Weeks   Status New     PT LONG TERM GOAL #3   Title Patient will sleep through the night without pain    Time 8   Period Weeks   Status New     PT LONG TERM GOAL #4   Title Patient will demsotrate a 28% limitation on FOTO    Time 8   Period Weeks   Status New     PT LONG TERM GOAL #5   Title Patient will be inedepenent with light gym program if cleared by MD    Time 8   Period Weeks   Status New               Plan - 07/01/16 1232    Clinical Impression Statement Pain after manual 5/10.  He responded well to gentle manual with 129 PROM flexion.  He also responded well to retrograde and lymph system vacume activation with reduced edema.   PT Next Visit Plan continue with stretching. Per protocol regain full motion as tolerated No restrictions    PT Home Exercise Plan external rotation with wand, table slide    Consulted and Agree with Plan of Care Patient      Patient will benefit from skilled therapeutic intervention in order to improve the following deficits and impairments:  Decreased strength, Decreased mobility, Postural dysfunction, Impaired UE functional use, Decreased endurance  Visit Diagnosis: Stiffness of left shoulder, not elsewhere classified  Acute pain of left shoulder  Muscle weakness (generalized)  Localized edema     Problem List Patient Active Problem List   Diagnosis Date Noted  . Tear of left glenoid labrum, posterior 05/13/2016  . Fever 01/20/2011  . PLANTAR FASCIITIS, LEFT 05/04/2010  . HEEL PAIN 05/04/2010  . HALLUX RIGIDUS 05/04/2010    HARRIS,KAREN PTA 07/01/2016, 12:40 PM  [facility] [address] [place], [name], [address] Phone: [phone]   Fax:  [phone]  Name: [name] [name] MRN: [id number] Date of Birth: 1965/07/31

## 2016-07-01 NOTE — Therapy (Signed)
[place], [name], [address] Phone: [phone]   Fax:  [phone]  Physical Therapy Evaluation  Patient Details  Name: [name] [name] MRN: [id number] Date of Birth: 1965/09/27 Referring Provider: Dr Carter Kitten   Encounter Date: 06/30/2016      PT End of Session - 07/01/16 1046    Visit Number 1   Number of Visits 16   Date for PT Re-Evaluation 08/26/16   Authorization Type [facility]    PT Start Time 1500   PT Stop Time 1547   PT Time Calculation (min) 47 min   Activity Tolerance Patient tolerated treatment well   Behavior During Therapy [facility] for tasks assessed/performed      Past Medical History:  Diagnosis Date  . Allergy   . Anxiety   . Diverticulitis   . OCD (obsessive compulsive disorder)   . Tear of left glenoid labrum, posterior 05/13/2016    Past Surgical History:  Procedure Laterality Date  . COLONOSCOPY    . PARTIAL COLECTOMY  2009   for diverticulitis  . SHOULDER ARTHROSCOPY WITH BANKART REPAIR Left 05/13/2016   Procedure: SHOULDER ARTHROSCOPY WITH POSTERIOR BANKART REPAIR with extensive debridement, left bursectomy;  Surgeon: Marchia Bond, MD;  Location: [facility];  Service: Orthopedics;  Laterality: Left;  pre/Post Op Scalene block  . WISDOM TOOTH EXTRACTION  1988    There were no vitals filed for this visit.       Subjective Assessment - 06/30/16 1511    Subjective Patient had a posterior labral repair, a busectonmy, and an extensive debridement of his left shoulder. 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 He has recently came out of his sling. He has pain at night.    Limitations Walking;Standing   Diagnostic tests Nothing post op   Patient Stated Goals return to normal use of his left arm    Currently in Pain? Yes  8/10   Pain Score 3    Pain Orientation Right   Pain Descriptors / Indicators Aching   Pain Type Surgical pain   Pain Onset  More than a month ago   Pain Frequency Constant   Aggravating Factors  Use of the left arm    Pain Relieving Factors rest    Effect of Pain on Daily Activities difficulty perfroming ADL's             [facility] PT Assessment - 07/01/16 0001      Assessment   Medical Diagnosis Left shoulder Posteriro Bankart repair    Referring Provider Dr Carter Kitten    Onset Date/Surgical Date 06/21/16   Next MD Visit 4 weeks      Precautions   Precautions None   Precaution Comments Per protocol no ROM restrictions at this time     Restrictions   Weight Bearing Restrictions No     Balance Screen   Has the patient fallen in the past 6 months No     [place] residence   Additional Comments Nothing significant      Prior Function   Level of Independence Independent   Vocation Full time employment   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      Coordination   Gross Motor Movements are Fluid and Coordinated No     Posture/Postural Control   Posture Comments Good posture      ROM / Strength   AROM / PROM / Strength AROM;PROM;Strength     AROM   Overall AROM Comments Active motion not tested 2nd to first day of treatment      PROM   PROM Assessment Site Shoulder   Right/Left Shoulder Right;Left   Left Shoulder Flexion 85 Degrees   Left Shoulder ABduction 60 Degrees   Left Shoulder Internal Rotation 70 Degrees    Left Shoulder External Rotation 18 Degrees     Strength   Overall Strength Comments Strengthening not tested the paitient is having pain      Flexibility   Soft Tissue Assessment /Muscle Length no     Palpation   Palpation comment mild tenderness to palpation over the anterior shoulder                    [facility] Adult PT Treatment/Exercise - 07/01/16 0001      Shoulder Exercises: Supine   Other Supine Exercises supine shoulder ER with wand; cuing for elbow placement at 45 degrees     Shoulder Exercises: Seated   Other Seated Exercises Shoulder slide x10 with 5 second hold      Manual Therapy   Manual therapy comments PROM of the left shoulder into pain free ranges Grade 1 and II PA and AP mobilization to improve range and decrease pain.                 PT Education - 07/01/16 1042    Education provided Yes   Education Details HEP, Do not over stretch in the beggining    Person(s) Educated Patient   Methods Explanation;Demonstration;Tactile cues;Verbal cues   Comprehension Verbalized understanding;Returned demonstration          PT Short Term Goals - 07/01/16 1059      PT SHORT TERM GOAL #1   Title Patient will increase left shoulder ER by 30 degrees    Baseline 18 baseline    Time 4   Period Weeks   Status New     PT SHORT TERM GOAL #2   Title Patient will report 2/10 pain at worst in left shoulder    Time 4   Period Weeks   Status New     PT SHORT TERM GOAL #3   Title Patient will increase passive flexion by 40 degrees    Baseline 88 baseline    Time 4   Period Weeks   Status New     PT SHORT TERM GOAL #4   Title Patient will be independent with initial HEP    Time 4   Period Weeks   Status New     PT SHORT TERM GOAL #5   Title Patient will demsotrate 4/5 gross left shoulder strength in available range    Time 4   Period Weeks   Status New           PT Long Term  Goals - 07/01/16 1103      PT LONG TERM GOAL #1   Title  Patient will reach behind back to t-8 without pain in order to perfrom ADL's    Time 8   Period Weeks   Status New     PT LONG TERM GOAL #2   Title Patient will reach overhead into a cabinet without pain in order to perfrom IADL's    Time 8   Period Weeks   Status New     PT LONG TERM GOAL #3   Title Patient will sleep through the night without pain    Time 8   Period Weeks   Status New     PT LONG TERM GOAL #4   Title Patient will demsotrate a 28% limitation on FOTO    Time 8   Period Weeks   Status New     PT LONG TERM GOAL #5   Title Patient will be inedepenent with light gym program if cleared by MD    Time 8   Period Weeks   Status New               Plan - 07/01/16 1050    Clinical Impression Statement Patient is a 50 year old male S/P Left posterior Bankart repair, bursectomy, and extensive debidement. He has expected post-op stiffness and decreased strength. Most of his pain is in his anterior shoulder at night. He was educated on positions for sleeping that may decrease his pain. He would benefit from further thrapy to improve strength, stability and fucntion.     Rehab Potential Excellent   PT Frequency 2x / week   PT Duration 8 weeks   PT Treatment/Interventions ADLs/Self Care Home Management;Cryotherapy;Electrical Stimulation;Stair training;Gait training;Ultrasound;Moist Heat;Iontophoresis 4mg /ml Dexamethasone;Functional mobility training;Therapeutic activities;Therapeutic exercise;Neuromuscular re-education;Manual techniques;Patient/family education;Dry needling;Splinting;Taping   PT Next Visit Plan continue with stretching. Per protocol regain full motion as tolerated No restrictions    PT Home Exercise Plan external rotation with wand, table slide    Consulted and Agree with Plan of Care Patient      Patient will benefit from skilled therapeutic intervention in order to improve the following deficits and impairments:  Decreased strength, Decreased  mobility, Postural dysfunction, Impaired UE functional use, Decreased endurance  Visit Diagnosis: Stiffness of left shoulder, not elsewhere classified - Plan: PT plan of care cert/re-cert  Acute pain of left shoulder - Plan: PT plan of care cert/re-cert  Muscle weakness (generalized) - Plan: PT plan of care cert/re-cert     Problem List Patient Active Problem List   Diagnosis Date Noted  . Tear of left glenoid labrum, posterior 05/13/2016  . Fever 01/20/2011  . PLANTAR FASCIITIS, LEFT 05/04/2010  . HEEL PAIN 05/04/2010  . HALLUX RIGIDUS 05/04/2010    Carney Living PT DPT  07/01/2016, 11:14 AM  [facility] [address] [place], [name], [address] Phone: [phone]   Fax:  [phone]  Name: [name] [name] MRN: [id number] Date of Birth: [birth year]

## 2016-07-15 ENCOUNTER — Ambulatory Visit: Payer: 59 | Attending: Orthopedic Surgery | Admitting: Physical Therapy

## 2016-07-15 DIAGNOSIS — M25512 Pain in left shoulder: Secondary | ICD-10-CM

## 2016-07-15 DIAGNOSIS — M6281 Muscle weakness (generalized): Secondary | ICD-10-CM | POA: Diagnosis not present

## 2016-07-15 DIAGNOSIS — M25612 Stiffness of left shoulder, not elsewhere classified: Secondary | ICD-10-CM | POA: Diagnosis not present

## 2016-07-15 DIAGNOSIS — R600 Localized edema: Secondary | ICD-10-CM

## 2016-07-15 NOTE — Therapy (Signed)
[place], [name], [address] Phone: [phone]   Fax:  [phone]  Physical Therapy Treatment  Patient Details  Name: [name] [name] MRN: [id number] Date of Birth: 1965/10/22 Referring Provider: Dr Carter Kitten   Encounter Date: 07/15/2016      PT End of Session - 07/15/16 1659    Visit Number 3   Number of Visits 16   Date for PT Re-Evaluation 08/26/16   Authorization Type [facility]    PT Start Time [id number]   PT Stop Time 1245   PT Time Calculation (min) 60 min   Activity Tolerance Patient tolerated treatment well   Behavior During Therapy [facility] for tasks assessed/performed      Past Medical History:  Diagnosis Date  . Allergy   . Anxiety   . Diverticulitis   . OCD (obsessive compulsive disorder)   . Tear of left glenoid labrum, posterior 05/13/2016    Past Surgical History:  Procedure Laterality Date  . COLONOSCOPY    . PARTIAL COLECTOMY  2009   for diverticulitis  . SHOULDER ARTHROSCOPY WITH BANKART REPAIR Left 05/13/2016   Procedure: SHOULDER ARTHROSCOPY WITH POSTERIOR BANKART REPAIR with extensive debridement, left bursectomy;  Surgeon: Marchia Bond, MD;  Location: [place];  Service: Orthopedics;  Laterality: Left;  pre/Post Op Scalene block  . WISDOM TOOTH EXTRACTION  1988    There were no vitals filed for this visit.      Subjective Assessment - 07/15/16 1654    Subjective Patient reports he did well at first perfroming his exercises but had some things come up over his vacation and was unable to perfrom all of them. He continues to have pain at night. He has tried different positioning but has hd increased pain.    Limitations Walking;Standing   Diagnostic tests Nothing post op   Patient Stated Goals return to normal use of his left arm    Currently in Pain? Yes   Pain Score 4    Pain Location Shoulder   Pain Orientation Right   Pain Descriptors / Indicators  Aching   Pain Type Surgical pain   Pain Onset More than a month ago   Pain Frequency Constant   Aggravating Factors  use of the left arm    Pain Relieving Factors rest    Effect of Pain on Daily Activities difficulty perfroming ADL's                          [facility] Adult PT Treatment/Exercise - 07/15/16 0001      Shoulder Exercises: Supine   Other Supine Exercises supine shoulder ER with wand; cuing for elbow placement at 45 degrees     Shoulder Exercises: Prone   Retraction Limitations 2x10 1 lb    Extension Limitations 1lb 2x10      Shoulder Exercises: Pulleys   Flexion 3 minutes     Modalities   Modalities Cryotherapy     Cryotherapy   Number Minutes Cryotherapy 10 Minutes   Cryotherapy Location Shoulder   Type of Cryotherapy Ice pack     Manual Therapy   Manual therapy comments PROM of the left shoulder into pain free ranges Grade 1 and II PA and AP mobilization to improve range and decrease pain. Grade II and III PA and AP mobilizations to improve ER/IR  PT Short Term Goals - 07/01/16 1059      PT SHORT TERM GOAL #1   Title Patient will increase left shoulder ER by 30 degrees    Baseline 18 baseline    Time 4   Period Weeks   Status New     PT SHORT TERM GOAL #2   Title Patient will report 2/10 pain at worst in left shoulder    Time 4   Period Weeks   Status New     PT SHORT TERM GOAL #3   Title Patient will increase passive flexion by 40 degrees    Baseline 88 baseline    Time 4   Period Weeks   Status New     PT SHORT TERM GOAL #4   Title Patient will be independent with initial HEP    Time 4   Period Weeks   Status New     PT SHORT TERM GOAL #5   Title Patient will demsotrate 4/5 gross left shoulder strength in available range    Time 4   Period Weeks   Status New           PT Long Term Goals - 07/01/16 1103      PT LONG TERM GOAL #1   Title Patient will reach behind back to t-8 without  pain in order to perfrom ADL's    Time 8   Period Weeks   Status New     PT LONG TERM GOAL #2   Title Patient will reach overhead into a cabinet without pain in order to perfrom IADL's    Time 8   Period Weeks   Status New     PT LONG TERM GOAL #3   Title Patient will sleep through the night without pain    Time 8   Period Weeks   Status New     PT LONG TERM GOAL #4   Title Patient will demsotrate a 28% limitation on FOTO    Time 8   Period Weeks   Status New     PT LONG TERM GOAL #5   Title Patient will be inedepenent with light gym program if cleared by MD    Time 8   Period Weeks   Status New               Plan - 07/15/16 1701    Clinical Impression Statement Patient''s range improve with mobilizations. SHe continues to have pain with flexion but it improved with treatment. He was given scapular stregthening exercises. He had no increase in pain. Therapy will add ther-ex if the patient tolerates treatment.    Rehab Potential Excellent   PT Treatment/Interventions ADLs/Self Care Home Management;Cryotherapy;Electrical Stimulation;Stair training;Gait training;Ultrasound;Moist Heat;Iontophoresis 4mg /ml Dexamethasone;Functional mobility training;Therapeutic activities;Therapeutic exercise;Neuromuscular re-education;Manual techniques;Patient/family education;Dry needling;Splinting;Taping;Vasopneumatic Device   PT Next Visit Plan continue with stretching. Per protocol regain full motion as tolerated No restrictions    PT Home Exercise Plan external rotation with wand, table slide    Consulted and Agree with Plan of Care Patient      Patient will benefit from skilled therapeutic intervention in order to improve the following deficits and impairments:  Decreased strength, Decreased mobility, Postural dysfunction, Impaired UE functional use, Decreased endurance  Visit Diagnosis: Stiffness of left shoulder, not elsewhere classified  Acute pain of left shoulder  Muscle  weakness (generalized)  Localized edema     Problem List Patient Active Problem List   Diagnosis Date Noted  . Tear of  left glenoid labrum, posterior 05/13/2016  . Fever 01/20/2011  . PLANTAR FASCIITIS, LEFT 05/04/2010  . HEEL PAIN 05/04/2010  . HALLUX RIGIDUS 05/04/2010    Carney Living  PT DPT  07/15/2016, 5:04 PM  [facility] [address] [place], [name], [address] Phone: [phone]   Fax:  [phone]  Name: [name] [name] MRN: [id number] Date of Birth: 1965-10-30

## 2016-07-16 ENCOUNTER — Ambulatory Visit: Payer: 59 | Admitting: Physical Therapy

## 2016-07-16 ENCOUNTER — Encounter: Payer: Self-pay | Admitting: Physical Therapy

## 2016-07-16 DIAGNOSIS — M25612 Stiffness of left shoulder, not elsewhere classified: Secondary | ICD-10-CM

## 2016-07-16 DIAGNOSIS — M6281 Muscle weakness (generalized): Secondary | ICD-10-CM

## 2016-07-16 DIAGNOSIS — M25512 Pain in left shoulder: Secondary | ICD-10-CM | POA: Diagnosis not present

## 2016-07-16 DIAGNOSIS — R600 Localized edema: Secondary | ICD-10-CM | POA: Diagnosis not present

## 2016-07-16 NOTE — Therapy (Signed)
[place], [name], [address] Phone: [phone]   Fax:  [phone]  Physical Therapy Treatment  Patient Details  Name: [name] [name] MRN: [id number] Date of Birth: 1965-10-05 Referring Provider: Dr Carter Kitten   Encounter Date: 07/16/2016      PT End of Session - 07/16/16 1058    Visit Number 4   Number of Visits 16   Date for PT Re-Evaluation 08/26/16   Authorization Type [facility]    PT Start Time 0847   PT Stop Time 0940   PT Time Calculation (min) 53 min   Activity Tolerance Patient tolerated treatment well   Behavior During Therapy [facility] for tasks assessed/performed      Past Medical History:  Diagnosis Date  . Allergy   . Anxiety   . Diverticulitis   . OCD (obsessive compulsive disorder)   . Tear of left glenoid labrum, posterior 05/13/2016    Past Surgical History:  Procedure Laterality Date  . COLONOSCOPY    . PARTIAL COLECTOMY  2009   for diverticulitis  . SHOULDER ARTHROSCOPY WITH BANKART REPAIR Left 05/13/2016   Procedure: SHOULDER ARTHROSCOPY WITH POSTERIOR BANKART REPAIR with extensive debridement, left bursectomy;  Surgeon: Marchia Bond, MD;  Location: [place];  Service: Orthopedics;  Laterality: Left;  pre/Post Op Scalene block  . WISDOM TOOTH EXTRACTION  1988    There were no vitals filed for this visit.      Subjective Assessment - 07/16/16 0852    Subjective Patient was able to sleep until 5 oclock this morning. It is sore this morning on top of his shoulder but it is better.    Limitations Walking;Standing   Diagnostic tests Nothing post op   Patient Stated Goals return to normal use of his left arm    Currently in Pain? Yes   Pain Score 3    Pain Location Shoulder   Pain Orientation Right   Pain Descriptors / Indicators Aching   Pain Type Surgical pain   Pain Onset More than a month ago   Aggravating Factors  use of the left arm    Pain  Relieving Factors rest    Effect of Pain on Daily Activities diffiuclty perfroming ADL's             [facility] PT Assessment - 07/16/16 0001      PROM   Left Shoulder Flexion 145 Degrees   Left Shoulder External Rotation 19 Degrees                     [facility] Adult PT Treatment/Exercise - 07/16/16 0001      Shoulder Exercises: Supine   Other Supine Exercises supine wand flexion 2x10; aidelying ER x10 without weight x10 with 1 lb; supine ABC A-Z i lb      Shoulder Exercises: Prone   Retraction Limitations 2x10 1 lb    Extension Limitations 1lb 2x10      Modalities   Modalities Cryotherapy     Cryotherapy   Number Minutes Cryotherapy 10 Minutes   Cryotherapy Location Shoulder   Type of Cryotherapy Ice pack                PT Education - 07/16/16 1058    Education provided Yes   Education Details Sleeping posture    Person(s) Educated Patient   Methods Explanation;Demonstration;Tactile cues;Verbal cues   Comprehension Returned demonstration;Verbalized understanding  PT Short Term Goals - 07/16/16 0753      PT SHORT TERM GOAL #1   Baseline 35 degrees 07/17/2015   Time 4   Period Weeks   Status On-going     PT SHORT TERM GOAL #2   Title Patient will report 2/10 pain at worst in left shoulder    Time 4   Period Weeks   Status On-going     PT SHORT TERM GOAL #3   Title Patient will increase passive flexion by 40 degrees    Baseline 88 baseline    Time 4   Period Weeks   Status On-going     PT SHORT TERM GOAL #4   Title Patient will be independent with initial HEP    Time 4   Period Weeks   Status On-going     PT SHORT TERM GOAL #5   Title Patient will demsotrate 4/5 gross left shoulder strength in available range    Time 4   Period Weeks   Status On-going           PT Long Term Goals - 07/01/16 1103      PT LONG TERM GOAL #1   Title Patient will reach behind back to t-8 without pain in order to perfrom ADL's    Time 8    Period Weeks   Status New     PT LONG TERM GOAL #2   Title Patient will reach overhead into a cabinet without pain in order to perfrom IADL's    Time 8   Period Weeks   Status New     PT LONG TERM GOAL #3   Title Patient will sleep through the night without pain    Time 8   Period Weeks   Status New     PT LONG TERM GOAL #4   Title Patient will demsotrate a 28% limitation on FOTO    Time 8   Period Weeks   Status New     PT LONG TERM GOAL #5   Title Patient will be inedepenent with light gym program if cleared by MD    Time 8   Period Weeks   Status New               Plan - 07/16/16 1058    Clinical Impression Statement Patient tolerated treatment well. His range improved see assessment. He had less pain with PROM. Therapy added light rotator cuff and stabilityactivity today. He had no pain after treatment.    Rehab Potential Excellent   PT Frequency 2x / week   PT Duration 8 weeks   PT Treatment/Interventions ADLs/Self Care Home Management;Cryotherapy;Electrical Stimulation;Stair training;Gait training;Ultrasound;Moist Heat;Iontophoresis 4mg /ml Dexamethasone;Functional mobility training;Therapeutic activities;Therapeutic exercise;Neuromuscular re-education;Manual techniques;Patient/family education;Dry needling;Splinting;Taping;Vasopneumatic Device   PT Next Visit Plan continue with stretching. Per protocol regain full motion as tolerated No restrictions; reviewe new exercises; add tband retractions and extension with yellow band; Continuee manual stretching;    PT Home Exercise Plan external rotation with wand, ; Supine ABC; Prone row, prone extension, wand flexion    Consulted and Agree with Plan of Care Patient      Patient will benefit from skilled therapeutic intervention in order to improve the following deficits and impairments:  Decreased strength, Decreased mobility, Postural dysfunction, Impaired UE functional use, Decreased endurance  Visit  Diagnosis: Stiffness of left shoulder, not elsewhere classified  Acute pain of left shoulder  Muscle weakness (generalized)  Localized edema     Problem List Patient  Active Problem List   Diagnosis Date Noted  . Tear of left glenoid labrum, posterior 05/13/2016  . Fever 01/20/2011  . PLANTAR FASCIITIS, LEFT 05/04/2010  . HEEL PAIN 05/04/2010  . HALLUX RIGIDUS 05/04/2010    Carney Living PT DPT  07/16/2016, 1:00 PM  [facility] [address] [place], [name], [address] Phone: [phone]   Fax:  [phone]  Name: [name] [name] MRN: [id number] Date of Birth: Jul 15, 1965

## 2016-07-20 ENCOUNTER — Ambulatory Visit: Payer: 59 | Admitting: Physical Therapy

## 2016-07-20 DIAGNOSIS — R600 Localized edema: Secondary | ICD-10-CM

## 2016-07-20 DIAGNOSIS — M25512 Pain in left shoulder: Secondary | ICD-10-CM

## 2016-07-20 DIAGNOSIS — M25612 Stiffness of left shoulder, not elsewhere classified: Secondary | ICD-10-CM | POA: Diagnosis not present

## 2016-07-20 DIAGNOSIS — M6281 Muscle weakness (generalized): Secondary | ICD-10-CM

## 2016-07-20 NOTE — Patient Instructions (Signed)
Continue icing ( with extra layers) intermittantly.

## 2016-07-20 NOTE — Therapy (Signed)
[name] [name], [name], [address] Phone: [phone]   Fax:  [phone]  Physical Therapy Treatment  Patient Details  Name: [name] [name] MRN: [id number] Date of Birth: [birth year] Referring Provider: Dr Carter Kitten   Encounter Date: 07/20/2016      PT End of Session - 07/20/16 1342    Visit Number 5   Number of Visits 16   Date for PT Re-Evaluation 08/26/16   PT Start Time 1103   PT Stop Time 1150   PT Time Calculation (min) 47 min   Behavior During Therapy [facility] for tasks assessed/performed      Past Medical History:  Diagnosis Date  . Allergy   . Anxiety   . Diverticulitis   . OCD (obsessive compulsive disorder)   . Tear of left glenoid labrum, posterior 05/13/2016    Past Surgical History:  Procedure Laterality Date  . COLONOSCOPY    . PARTIAL COLECTOMY  2009   for diverticulitis  . SHOULDER ARTHROSCOPY WITH BANKART REPAIR Left 05/13/2016   Procedure: SHOULDER ARTHROSCOPY WITH POSTERIOR BANKART REPAIR with extensive debridement, left bursectomy;  Surgeon: Marchia Bond, MD;  Location: [place];  Service: Orthopedics;  Laterality: Left;  pre/Post Op Scalene block  . WISDOM TOOTH EXTRACTION  1988    There were no vitals filed for this visit.      Subjective Assessment - 07/20/16 1124    Subjective Shoulder was sore and then he fell on it landing on his side on the shoulder yesterday. It continues to be sore on the top of his shoulder. He continues to wean from his pain meds.   Currently in Pain? Yes   Pain Score 5   moderate   Pain Location Shoulder   Pain Orientation Right;Posterior;Upper   Pain Descriptors / Indicators Aching;Sore   Pain Type Surgical pain;Acute pain   Pain Onset More than a month ago   Pain Frequency Constant   Aggravating Factors  slipping then falling on shoulder   Pain Relieving Factors ice, rest   Multiple Pain Sites No            [facility]  PT Assessment - 07/20/16 0001      PROM   Left Shoulder Flexion 120 Degrees   Left Shoulder External Rotation 26 Degrees     Special Tests    Special Tests Laxity/Instability Tests  PT Carlus Pavlov checked shoulder for issues post falling   Laxity/Instability  Load and shift test;Posterior Apprehension test;Anterior Apprehension test;Relocation test;other  Bicep load test     Load and Shift test    Findings Negative   Side Left   Comment Per Carlus Pavlov PT                     [facility] Adult PT Treatment/Exercise - 07/20/16 0001      Cryotherapy   Number Minutes Cryotherapy 15 Minutes   Cryotherapy Location Shoulder   Type of Cryotherapy --  cold pack     Electrical Stimulation   Electrical Stimulation Location shoulder   Electrical Stimulation Action IFC   Electrical Stimulation Parameters 6-9   Electrical Stimulation Goals Pain  decreased to 6(from 9)  then stopped due to deep ach     Manual Therapy   Manual therapy comments soft tissue work, gentle ROM IR/ER rotation with palpation no clunking or subluxing noted  Left.    tissue softened.  PT Short Term Goals - 07/20/16 1348      PT SHORT TERM GOAL #1   Title Patient will increase left shoulder ER by 30 degrees    Baseline 26   Time 4   Period Weeks   Status On-going     PT SHORT TERM GOAL #2   Title Patient will report 2/10 pain at worst in left shoulder    Baseline moderate   Time 4   Period Weeks   Status On-going     PT SHORT TERM GOAL #3   Baseline 120   Time 4   Period Weeks   Status On-going     PT SHORT TERM GOAL #4   Title Patient will be independent with initial HEP    Baseline independent   Time 4   Period Weeks   Status On-going     PT SHORT TERM GOAL #5   Title Patient will demsotrate 4/5 gross left shoulder strength in available range    Time 4   Period Weeks   Status Unable to assess           PT Long Term Goals - 07/01/16 1103      PT  LONG TERM GOAL #1   Title Patient will reach behind back to t-8 without pain in order to perfrom ADL's    Time 8   Period Weeks   Status New     PT LONG TERM GOAL #2   Title Patient will reach overhead into a cabinet without pain in order to perfrom IADL's    Time 8   Period Weeks   Status New     PT LONG TERM GOAL #3   Title Patient will sleep through the night without pain    Time 8   Period Weeks   Status New     PT LONG TERM GOAL #4   Title Patient will demsotrate a 28% limitation on FOTO    Time 8   Period Weeks   Status New     PT LONG TERM GOAL #5   Title Patient will be inedepenent with light gym program if cleared by MD    Time 8   Period Weeks   Status New               Plan - 07/20/16 1343    Clinical Impression Statement Patient fell last night.  He peformed his exercises prior and after the fall, he is using ice to assist the pain.  Pain decreased to 3-4 post session.  Carlus Pavlov checked shoulder with special tests and findings were negative.  It is interesting to note he felt a deep ach with IFC so treatment was stopped early.  PROM 120 initially today flexion.  ER  26.STG#4 met.(Independent with initial exercise program)   PT Next Visit Plan continue with stretching. Per protocol regain full motion as tolerated No restrictions; reviewe new exercises; add tband retractions and extension with yellow band; Continuee manual stretching;    PT Home Exercise Plan external rotation with wand, ; Supine ABC; Prone row, prone extension, wand flexion    Consulted and Agree with Plan of Care Patient      Patient will benefit from skilled therapeutic intervention in order to improve the following deficits and impairments:     Visit Diagnosis: Stiffness of left shoulder, not elsewhere classified  Acute pain of left shoulder  Muscle weakness (generalized)  Localized edema     Problem List Patient Active  Problem List   Diagnosis Date Noted  . Tear of  left glenoid labrum, posterior 05/13/2016  . Fever 01/20/2011  . PLANTAR FASCIITIS, LEFT 05/04/2010  . HEEL PAIN 05/04/2010  . HALLUX RIGIDUS 05/04/2010    Jaeda Bruso PTA 07/20/2016, 1:52 PM  [facility] [address] [place], [name], [address] Phone: [phone]   Fax:  [phone]  Name: [name] [name] MRN: [id number] Date of Birth: March 21, 1966

## 2016-07-22 ENCOUNTER — Ambulatory Visit: Payer: 59 | Admitting: Physical Therapy

## 2016-07-23 MED FILL — traMADol HCL 50 MG TABS: 50 | 12 days supply | Qty: 50 | Fill #0

## 2016-07-26 MED FILL — clonazePAM 1 MG TABS: 1 | 30 days supply | Qty: 60 | Fill #0

## 2016-07-27 ENCOUNTER — Encounter: Payer: Self-pay | Admitting: Physical Therapy

## 2016-07-27 ENCOUNTER — Ambulatory Visit: Payer: 59 | Admitting: Physical Therapy

## 2016-07-27 DIAGNOSIS — M6281 Muscle weakness (generalized): Secondary | ICD-10-CM

## 2016-07-27 DIAGNOSIS — M25512 Pain in left shoulder: Secondary | ICD-10-CM

## 2016-07-27 DIAGNOSIS — R600 Localized edema: Secondary | ICD-10-CM | POA: Diagnosis not present

## 2016-07-27 DIAGNOSIS — M25612 Stiffness of left shoulder, not elsewhere classified: Secondary | ICD-10-CM | POA: Diagnosis not present

## 2016-07-27 NOTE — Therapy (Signed)
[place], [name], [address] Phone: [phone]   Fax:  [phone]  Physical Therapy Treatment  Patient Details  Name: [name] [name] MRN: [id number] Date of Birth: 14-Mar-1966 Referring Provider: Dr Carter Kitten   Encounter Date: 07/27/2016      PT End of Session - 07/27/16 0941    Visit Number 6   Number of Visits 16   Date for PT Re-Evaluation 08/26/16   Authorization Type [facility]    PT Start Time 0845   PT Stop Time 0928   PT Time Calculation (min) 43 min   Activity Tolerance Patient tolerated treatment well   Behavior During Therapy [facility] for tasks assessed/performed      Past Medical History:  Diagnosis Date  . Allergy   . Anxiety   . Diverticulitis   . OCD (obsessive compulsive disorder)   . Tear of left glenoid labrum, posterior 05/13/2016    Past Surgical History:  Procedure Laterality Date  . COLONOSCOPY    . PARTIAL COLECTOMY  2009   for diverticulitis  . SHOULDER ARTHROSCOPY WITH BANKART REPAIR Left 05/13/2016   Procedure: SHOULDER ARTHROSCOPY WITH POSTERIOR BANKART REPAIR with extensive debridement, left bursectomy;  Surgeon: Marchia Bond, MD;  Location: [place];  Service: Orthopedics;  Laterality: Left;  pre/Post Op Scalene block  . WISDOM TOOTH EXTRACTION  1988    There were no vitals filed for this visit.      Subjective Assessment - 07/27/16 0938    Subjective Patient continues to report soreness in the posteerior aspect of his shoulder. he reports it is not much different after the fall then it was before the fall. A few nights ago the pain woke him up several times. He has been trying to use the left shoulder more.  His pain isworse at night.    Limitations Walking;Standing   Diagnostic tests Nothing post op   Patient Stated Goals return to normal use of his left arm    Currently in Pain? Yes   Pain Score 4    Pain Location Shoulder   Pain  Orientation Right;Posterior;Upper   Pain Descriptors / Indicators Aching;Sore   Pain Type Surgical pain;Acute pain   Pain Onset More than a month ago   Pain Frequency Constant   Aggravating Factors  slipping and falling on the left shoulder    Pain Relieving Factors ice, rest    Effect of Pain on Daily Activities difficulty perfroming ADl's                          [facility] Adult PT Treatment/Exercise - 07/27/16 0001      Shoulder Exercises: Supine   External Rotation --  x2   Other Supine Exercises supine wand flexion 2x10; aidelying ER x10 without weight x10 with 1 lb; supine ABC A-Z i lb      Shoulder Exercises: Seated   Extension Limitations 2x10 red    Row Limitations 2x10 red    External Rotation Limitations 2x10 red    Internal Rotation Limitations 2x10 red      Shoulder Exercises: Prone   Retraction Limitations 2x10 1 lb    Extension Limitations 1lb 2x10      Modalities   Modalities Iontophoresis     Iontophoresis   Type of Iontophoresis Dexamethasone   Location left posterior shoulder    Dose 1cc   Time long release  PT Education - 07/27/16 0941    Education provided Yes   Education Details sleeping posture, updated HEP, use of Ionto to decrease inflammation    Person(s) Educated Patient   Methods Explanation;Demonstration;Tactile cues;Verbal cues   Comprehension Verbalized understanding;Verbal cues required;Returned demonstration          PT Short Term Goals - 07/27/16 0948      PT SHORT TERM GOAL #1   Title Patient will increase left shoulder ER by 30 degrees    Baseline 26   Time 4   Period Weeks   Status On-going     PT SHORT TERM GOAL #2   Title Patient will report 2/10 pain at worst in left shoulder    Baseline moderate   Time 4   Period Weeks   Status On-going     PT SHORT TERM GOAL #3   Title Patient will increase passive flexion by 40 degrees    Baseline 120   Time 4   Period Days   Status  On-going     PT SHORT TERM GOAL #4   Title Patient will be independent with initial HEP    Baseline independent   Time 4   Period Weeks   Status On-going     PT SHORT TERM GOAL #5   Title Patient will demsotrate 4/5 gross left shoulder strength in available range    Time 4   Period Weeks   Status On-going           PT Long Term Goals - 07/01/16 1103      PT LONG TERM GOAL #1   Title Patient will reach behind back to t-8 without pain in order to perfrom ADL's    Time 8   Period Weeks   Status New     PT LONG TERM GOAL #2   Title Patient will reach overhead into a cabinet without pain in order to perfrom IADL's    Time 8   Period Weeks   Status New     PT LONG TERM GOAL #3   Title Patient will sleep through the night without pain    Time 8   Period Weeks   Status New     PT LONG TERM GOAL #4   Title Patient will demsotrate a 28% limitation on FOTO    Time 8   Period Weeks   Status New     PT LONG TERM GOAL #5   Title Patient will be inedepenent with light gym program if cleared by MD    Time 8   Period Weeks   Status New               Plan - 07/27/16 [id number]    Clinical Impression Statement Therapy added in band stregthening exercises. He continues to have pain but the pain is not increasing. Therapy also attempted Iontophoresis to decrease inflammation in the joint.  He tolerated new exercises well. His motion was restricted at first but improved with strethcing.    Rehab Potential Excellent   PT Frequency 2x / week   PT Duration 8 weeks   PT Treatment/Interventions ADLs/Self Care Home Management;Cryotherapy;Electrical Stimulation;Stair training;Gait training;Ultrasound;Moist Heat;Iontophoresis 4mg /ml Dexamethasone;Functional mobility training;Therapeutic activities;Therapeutic exercise;Neuromuscular re-education;Manual techniques;Patient/family education;Dry needling;Splinting;Taping;Vasopneumatic Device   PT Next Visit Plan continue with stretching.  Per protocol regain full motion as tolerated No restrictions; reviewe new exercises; add tband retractions and extension with yellow band; Continuee manual stretching;    PT Home Exercise Plan external rotation with  wand, ; Supine ABC; Prone row, prone extension, wand flexion    Consulted and Agree with Plan of Care Patient      Patient will benefit from skilled therapeutic intervention in order to improve the following deficits and impairments:  Decreased strength, Decreased mobility, Postural dysfunction, Impaired UE functional use, Decreased endurance  Visit Diagnosis: Stiffness of left shoulder, not elsewhere classified  Acute pain of left shoulder  Muscle weakness (generalized)  Localized edema     Problem List Patient Active Problem List   Diagnosis Date Noted  . Tear of left glenoid labrum, posterior 05/13/2016  . Fever 01/20/2011  . PLANTAR FASCIITIS, LEFT 05/04/2010  . HEEL PAIN 05/04/2010  . HALLUX RIGIDUS 05/04/2010    Carney Living PT DPT  07/27/2016, 3:36 PM  [facility] [address] [place], [name], [address] Phone: [phone]   Fax:  [phone]  Name: [name] [name] MRN: [id number] Date of Birth: Mar 08, 1966

## 2016-07-29 ENCOUNTER — Ambulatory Visit: Payer: 59 | Admitting: Physical Therapy

## 2016-08-03 ENCOUNTER — Ambulatory Visit: Payer: 59 | Admitting: Physical Therapy

## 2016-08-03 DIAGNOSIS — M25612 Stiffness of left shoulder, not elsewhere classified: Secondary | ICD-10-CM

## 2016-08-03 DIAGNOSIS — M6281 Muscle weakness (generalized): Secondary | ICD-10-CM | POA: Diagnosis not present

## 2016-08-03 DIAGNOSIS — R600 Localized edema: Secondary | ICD-10-CM

## 2016-08-03 DIAGNOSIS — M25512 Pain in left shoulder: Secondary | ICD-10-CM | POA: Diagnosis not present

## 2016-08-03 NOTE — Therapy (Signed)
[name] [name], [name], [address] Phone: [phone]   Fax:  [phone]  Physical Therapy Treatment  Patient Details  Name: [name] [name] MRN: [id number] Date of Birth: 22-Nov-1965 Referring Provider: Dr Carter Kitten   Encounter Date: 08/03/2016      PT End of Session - 08/03/16 2103    Visit Number 7   Number of Visits 16   Date for PT Re-Evaluation 08/26/16   Authorization Type [facility]    PT Start Time [id number]   PT Stop Time 1240   PT Time Calculation (min) 55 min   Activity Tolerance Patient tolerated treatment well   Behavior During Therapy [facility] for tasks assessed/performed      Past Medical History:  Diagnosis Date  . Allergy   . Anxiety   . Diverticulitis   . OCD (obsessive compulsive disorder)   . Tear of left glenoid labrum, posterior 05/13/2016    Past Surgical History:  Procedure Laterality Date  . COLONOSCOPY    . PARTIAL COLECTOMY  2009   for diverticulitis  . SHOULDER ARTHROSCOPY WITH BANKART REPAIR Left 05/13/2016   Procedure: SHOULDER ARTHROSCOPY WITH POSTERIOR BANKART REPAIR with extensive debridement, left bursectomy;  Surgeon: Marchia Bond, MD;  Location: [place];  Service: Orthopedics;  Laterality: Left;  pre/Post Op Scalene block  . WISDOM TOOTH EXTRACTION  1988    There were no vitals filed for this visit.      Subjective Assessment - 08/03/16 2052    Subjective {atient feels like his range has improved. He continues to have pain that keeps him up at night. He is trying not to use pain medications. He has tried positioning his arm at night but has some difficulty with positioning.    Limitations Walking;Standing   Diagnostic tests Nothing post op   Patient Stated Goals return to normal use of his left arm    Currently in Pain? Yes   Pain Score 4    Pain Location Shoulder   Pain Orientation Right;Posterior;Upper   Pain Descriptors / Indicators  Aching;Sore   Pain Type Acute pain;Surgical pain   Pain Onset More than a month ago   Pain Frequency Constant   Aggravating Factors  sleeping    Pain Relieving Factors ice, rest    Effect of Pain on Daily Activities difficulty perfroming ADL;s    Multiple Pain Sites No                         [facility] Adult PT Treatment/Exercise - 08/03/16 0001      Shoulder Exercises: Supine   Other Supine Exercises supine flexion 1x10 no weight 1x10 1 lb supine d2 gfleixon to belt buckle 2x10      Shoulder Exercises: Seated   Extension Limitations 2x10 green    Row Limitations 2x10 green     External Rotation Limitations 2x10 green    Internal Rotation Limitations 2x10 green      Shoulder Exercises: Prone   Retraction Limitations 2x10 1 lb    Extension Limitations 1lb 2x10      Shoulder Exercises: Pulleys   Flexion 3 minutes     Cryotherapy   Number Minutes Cryotherapy 10 Minutes   Cryotherapy Location Shoulder   Type of Cryotherapy Ice pack     Manual Therapy   Manual therapy comments PROM of the left shoulder into pain free ranges Grade 1 and II PA and AP mobilization to  improve range and decrease pain. Grade II and III PA and AP mobilizations to improve ER/IR                  PT Education - 08/03/16 2102    Education provided Yes   Education Details sleeping posture, updated psoture, use of ionto    Person(s) Educated Patient   Methods Explanation;Demonstration;Tactile cues;Verbal cues   Comprehension Verbalized understanding;Verbal cues required;Returned demonstration          PT Short Term Goals - 07/27/16 0948      PT SHORT TERM GOAL #1   Title Patient will increase left shoulder ER by 30 degrees    Baseline 26   Time 4   Period Weeks   Status On-going     PT SHORT TERM GOAL #2   Title Patient will report 2/10 pain at worst in left shoulder    Baseline moderate   Time 4   Period Weeks   Status On-going     PT SHORT TERM GOAL #3   Title  Patient will increase passive flexion by 40 degrees    Baseline 120   Time 4   Period Days   Status On-going     PT SHORT TERM GOAL #4   Title Patient will be independent with initial HEP    Baseline independent   Time 4   Period Weeks   Status On-going     PT SHORT TERM GOAL #5   Title Patient will demsotrate 4/5 gross left shoulder strength in available range    Time 4   Period Weeks   Status On-going           PT Long Term Goals - 07/01/16 1103      PT LONG TERM GOAL #1   Title Patient will reach behind back to t-8 without pain in order to perfrom ADL's    Time 8   Period Weeks   Status New     PT LONG TERM GOAL #2   Title Patient will reach overhead into a cabinet without pain in order to perfrom IADL's    Time 8   Period Weeks   Status New     PT LONG TERM GOAL #3   Title Patient will sleep through the night without pain    Time 8   Period Weeks   Status New     PT LONG TERM GOAL #4   Title Patient will demsotrate a 28% limitation on FOTO    Time 8   Period Weeks   Status New     PT LONG TERM GOAL #5   Title Patient will be inedepenent with light gym program if cleared by MD    Time 8   Period Weeks   Status New               Plan - 08/03/16 2103    Clinical Impression Statement Per visual inspection the patents range is improving. Therapy progressed exercises to include supine flexion and closed chair flexion with education to not put weight into hand but use the hand on the wall for stability. The patient was also advised to go back to the doctor for a follow up if needed.    PT Frequency 2x / week   PT Duration 8 weeks   PT Treatment/Interventions ADLs/Self Care Home Management;Cryotherapy;Electrical Stimulation;Stair training;Gait training;Ultrasound;Moist Heat;Iontophoresis 4mg /ml Dexamethasone;Functional mobility training;Therapeutic activities;Therapeutic exercise;Neuromuscular re-education;Manual techniques;Patient/family education;Dry  needling;Splinting;Taping;Vasopneumatic Device   PT Next Visit Plan continue  with stretching. Per protocol regain full motion as tolerated No restrictions; reviewe new exercises; add tband retractions and extension with yellow band; Continuee manual stretching;    PT Home Exercise Plan external rotation with wand, ; Supine ABC; Prone row, prone extension, wand flexion    Consulted and Agree with Plan of Care Patient      Patient will benefit from skilled therapeutic intervention in order to improve the following deficits and impairments:  Decreased strength, Decreased mobility, Postural dysfunction, Impaired UE functional use, Decreased endurance  Visit Diagnosis: Stiffness of left shoulder, not elsewhere classified  Acute pain of left shoulder  Muscle weakness (generalized)  Localized edema     Problem List Patient Active Problem List   Diagnosis Date Noted  . Tear of left glenoid labrum, posterior 05/13/2016  . Fever 01/20/2011  . PLANTAR FASCIITIS, LEFT 05/04/2010  . HEEL PAIN 05/04/2010  . HALLUX RIGIDUS 05/04/2010    Carney Living PT DPT  08/03/2016, 9:12 PM  [facility] [address] [place], [name], [address] Phone: [phone]   Fax:  [phone]  Name: Cylis Frison MRN: [id number] Date of Birth: 1966/03/27

## 2016-08-05 ENCOUNTER — Encounter: Payer: Self-pay | Admitting: Physical Therapy

## 2016-08-05 ENCOUNTER — Ambulatory Visit: Payer: 59 | Admitting: Physical Therapy

## 2016-08-05 DIAGNOSIS — M25612 Stiffness of left shoulder, not elsewhere classified: Secondary | ICD-10-CM | POA: Diagnosis not present

## 2016-08-05 DIAGNOSIS — M25512 Pain in left shoulder: Secondary | ICD-10-CM

## 2016-08-05 DIAGNOSIS — R600 Localized edema: Secondary | ICD-10-CM | POA: Diagnosis not present

## 2016-08-05 DIAGNOSIS — M6281 Muscle weakness (generalized): Secondary | ICD-10-CM

## 2016-08-05 NOTE — Therapy (Signed)
[place], [name], [address] Phone: [phone]   Fax:  [phone]  Physical Therapy Treatment  Patient Details  Name: [name] [name] MRN: [id number] Date of Birth: 1965/08/27 Referring Provider: Dr Carter Kitten   Encounter Date: 08/05/2016      PT End of Session - 08/05/16 1528    Visit Number 8   Number of Visits 16   Date for PT Re-Evaluation 08/26/16   Authorization Type [facility]    PT Start Time 1147   PT Stop Time 1240   PT Time Calculation (min) 53 min   Activity Tolerance Patient tolerated treatment well   Behavior During Therapy [facility] for tasks assessed/performed      Past Medical History:  Diagnosis Date  . Allergy   . Anxiety   . Diverticulitis   . OCD (obsessive compulsive disorder)   . Tear of left glenoid labrum, posterior 05/13/2016    Past Surgical History:  Procedure Laterality Date  . COLONOSCOPY    . PARTIAL COLECTOMY  2009   for diverticulitis  . SHOULDER ARTHROSCOPY WITH BANKART REPAIR Left 05/13/2016   Procedure: SHOULDER ARTHROSCOPY WITH POSTERIOR BANKART REPAIR with extensive debridement, left bursectomy;  Surgeon: Marchia Bond, MD;  Location: [place];  Service: Orthopedics;  Laterality: Left;  pre/Post Op Scalene block  . WISDOM TOOTH EXTRACTION  1988    There were no vitals filed for this visit.      Subjective Assessment - 08/05/16 1153    Subjective Patient reports he is still having pain at night. He feels some posterior tightness at times. He feels like he can wash his hair better and he has been able to put his belt on.    Limitations Walking;Standing   Diagnostic tests Nothing post op   Patient Stated Goals return to normal use of his left arm    Currently in Pain? Yes   Pain Score 4    Pain Location Shoulder   Pain Orientation Right;Left;Upper   Pain Descriptors / Indicators Aching;Sore   Pain Type Acute pain;Surgical pain   Pain  Onset More than a month ago   Pain Frequency Constant   Aggravating Factors  sleeping    Pain Relieving Factors ice, rest    Effect of Pain on Daily Activities difficulty perfroming ADL's    Multiple Pain Sites No            [facility] PT Assessment - 08/05/16 0001      PROM   Left Shoulder Flexion 135 Degrees   Left Shoulder External Rotation 65 Degrees                     [facility] Adult PT Treatment/Exercise - 08/05/16 0001      Shoulder Exercises: Supine   Other Supine Exercises supine flexion 1x10 no weight 1x10 1 lb supine d2 gfleixon to belt buckle 2x10      Shoulder Exercises: Seated   Extension Limitations 2x10 green    Row Limitations 2x10 green     External Rotation Limitations 2x10 red   Internal Rotation Limitations 2x10 green      Shoulder Exercises: Prone   Retraction Limitations 2x10 2 lb    Extension Limitations 2 lb 2x10      Shoulder Exercises: Standing   Other Standing Exercises wall slide with towel 2x10      Shoulder Exercises: Pulleys   Flexion 3 minutes     Cryotherapy  Number Minutes Cryotherapy 10 Minutes   Cryotherapy Location Shoulder   Type of Cryotherapy Ice pack     Manual Therapy   Manual therapy comments PROM of the left shoulder into pain free ranges Grade 1 and II PA and AP mobilization to improve range and decrease pain. Grade II and III PA and AP mobilizations to improve ER/IR                  PT Education - 08/05/16 1528    Education provided Yes   Education Details continue with HEP; continue with stretching    Person(s) Educated Patient   Methods Explanation;Demonstration;Tactile cues;Verbal cues   Comprehension Verbalized understanding;Verbal cues required;Returned demonstration          PT Short Term Goals - 07/27/16 0948      PT SHORT TERM GOAL #1   Title Patient will increase left shoulder ER by 30 degrees    Baseline 26   Time 4   Period Weeks   Status On-going     PT SHORT TERM GOAL #2    Title Patient will report 2/10 pain at worst in left shoulder    Baseline moderate   Time 4   Period Weeks   Status On-going     PT SHORT TERM GOAL #3   Title Patient will increase passive flexion by 40 degrees    Baseline 120   Time 4   Period Days   Status On-going     PT SHORT TERM GOAL #4   Title Patient will be independent with initial HEP    Baseline independent   Time 4   Period Weeks   Status On-going     PT SHORT TERM GOAL #5   Title Patient will demsotrate 4/5 gross left shoulder strength in available range    Time 4   Period Weeks   Status On-going           PT Long Term Goals - 07/01/16 1103      PT LONG TERM GOAL #1   Title Patient will reach behind back to t-8 without pain in order to perfrom ADL's    Time 8   Period Weeks   Status New     PT LONG TERM GOAL #2   Title Patient will reach overhead into a cabinet without pain in order to perfrom IADL's    Time 8   Period Weeks   Status New     PT LONG TERM GOAL #3   Title Patient will sleep through the night without pain    Time 8   Period Weeks   Status New     PT LONG TERM GOAL #4   Title Patient will demsotrate a 28% limitation on FOTO    Time 8   Period Weeks   Status New     PT LONG TERM GOAL #5   Title Patient will be inedepenent with light gym program if cleared by MD    Time 8   Period Weeks   Status New               Plan - 08/05/16 1528    Clinical Impression Statement Despite pain at night the patients strength and range of motion are improving. He was given an IR stretch. His range is progressing. See assessment. He had no significant pain with treatment.    Rehab Potential Excellent   PT Frequency 2x / week   PT Duration 8 weeks  PT Treatment/Interventions ADLs/Self Care Home Management;Cryotherapy;Electrical Stimulation;Stair training;Gait training;Ultrasound;Moist Heat;Iontophoresis 4mg /ml Dexamethasone;Functional mobility training;Therapeutic  activities;Therapeutic exercise;Neuromuscular re-education;Manual techniques;Patient/family education;Dry needling;Splinting;Taping;Vasopneumatic Device   PT Next Visit Plan continue with stretching. Per protocol regain full motion as tolerated No restrictions; reviewe new exercises; add tband retractions and extension with yellow band; Continuee manual stretching;    PT Home Exercise Plan external rotation with wand, ; Supine ABC; Prone row, prone extension, wand flexion    Consulted and Agree with Plan of Care Patient      Patient will benefit from skilled therapeutic intervention in order to improve the following deficits and impairments:  Decreased strength, Decreased mobility, Postural dysfunction, Impaired UE functional use, Decreased endurance  Visit Diagnosis: Stiffness of left shoulder, not elsewhere classified  Acute pain of left shoulder  Muscle weakness (generalized)  Localized edema     Problem List Patient Active Problem List   Diagnosis Date Noted  . Tear of left glenoid labrum, posterior 05/13/2016  . Fever 01/20/2011  . PLANTAR FASCIITIS, LEFT 05/04/2010  . HEEL PAIN 05/04/2010  . HALLUX RIGIDUS 05/04/2010    Carney Living PT DPT  08/05/2016, 3:37 PM  [facility] [address] [place], [name], [address] Phone: [phone]   Fax:  [phone]  Name: Timmie Dinsdale MRN: [id number] Date of Birth: [birth year]

## 2016-08-10 ENCOUNTER — Ambulatory Visit: Payer: 59 | Admitting: Physical Therapy

## 2016-08-12 ENCOUNTER — Ambulatory Visit: Payer: 59 | Attending: Orthopedic Surgery | Admitting: Physical Therapy

## 2016-08-12 ENCOUNTER — Encounter: Payer: Self-pay | Admitting: Physical Therapy

## 2016-08-12 DIAGNOSIS — M6281 Muscle weakness (generalized): Secondary | ICD-10-CM | POA: Insufficient documentation

## 2016-08-12 DIAGNOSIS — M25612 Stiffness of left shoulder, not elsewhere classified: Secondary | ICD-10-CM | POA: Insufficient documentation

## 2016-08-12 DIAGNOSIS — R600 Localized edema: Secondary | ICD-10-CM | POA: Diagnosis not present

## 2016-08-12 DIAGNOSIS — M25512 Pain in left shoulder: Secondary | ICD-10-CM | POA: Insufficient documentation

## 2016-08-12 NOTE — Therapy (Signed)
[place], [name], [address] Phone: [phone]   Fax:  [phone]  Physical Therapy Treatment  Patient Details  Name: [name] [name] MRN: [id number] Date of Birth: [birth year] Referring Provider: Dr Carter Kitten   Encounter Date: 08/12/2016      PT End of Session - 08/12/16 1324    Visit Number 9   Number of Visits 16   Date for PT Re-Evaluation 08/26/16   Authorization Type [facility] [employer]    PT Start Time [id number]   PT Stop Time 1225   PT Time Calculation (min) 40 min   Activity Tolerance Patient tolerated treatment well   Behavior During Therapy [facility] for tasks assessed/performed      Past Medical History:  Diagnosis Date  . Allergy   . Anxiety   . Diverticulitis   . OCD (obsessive compulsive disorder)   . Tear of left glenoid labrum, posterior 05/13/2016    Past Surgical History:  Procedure Laterality Date  . COLONOSCOPY    . PARTIAL COLECTOMY  2009   for diverticulitis  . SHOULDER ARTHROSCOPY WITH BANKART REPAIR Left 05/13/2016   Procedure: SHOULDER ARTHROSCOPY WITH POSTERIOR BANKART REPAIR with extensive debridement, left bursectomy;  Surgeon: Marchia Bond, MD;  Location: [facility];  Service: Orthopedics;  Laterality: Left;  pre/Post Op Scalene block  . WISDOM TOOTH EXTRACTION  1988    There were no vitals filed for this visit.      Subjective Assessment - 08/12/16 1152    Subjective Patient reports overall his pain has been tolerable. He slept through the night last night.    Limitations Walking;Standing   Diagnostic tests Nothing post op   Patient Stated Goals return to normal use of his left arm    Currently in Pain? Yes   Pain Score 3    Pain Location Shoulder   Pain Orientation Right;Left;Upper   Pain Descriptors / Indicators Aching   Pain Type Surgical pain   Pain Onset More than a month ago   Pain Frequency Constant                          [facility] Adult PT Treatment/Exercise - 08/12/16 0001      Shoulder Exercises: Supine   Other Supine Exercises supine flexion 1x10 no weight 1x10 1 lb supine d2 gfleixon to belt buckle 2x10      Shoulder Exercises: Seated   Extension Limitations 2x10 green    Row Limitations 2x10 green     External Rotation Limitations 2x10 red   Internal Rotation Limitations 2x10 green    Other Seated Exercises UBE 62min dforward 21min bacxk      Shoulder Exercises: Prone   Retraction Limitations 2x10 3lb    Extension Limitations 2 lb 2x10      Shoulder Exercises: Standing   Other Standing Exercises wall slide with towel 2x10                 PT Education - 08/12/16 1317    Education provided Yes   Education Details continue with HEP, continue with stretching    Person(s) Educated Patient   Methods Explanation;Demonstration;Tactile cues;Verbal cues   Comprehension Verbalized understanding;Returned demonstration;Verbal cues required          PT Short Term Goals - 08/12/16 1328      PT SHORT TERM GOAL #1   Title Patient will increase left shoulder ER by 30 degrees  Baseline 55 degrees    Time 4   Period Weeks   Status On-going     PT SHORT TERM GOAL #2   Title Patient will report 2/10 pain at worst in left shoulder    Baseline moderate   Time 4   Period Weeks   Status On-going     PT SHORT TERM GOAL #3   Title Patient will increase passive flexion by 40 degrees    Baseline 120   Time 4   Period Days   Status On-going     PT SHORT TERM GOAL #4   Title Patient will be independent with initial HEP    Baseline independent   Time 4   Period Weeks   Status On-going     PT SHORT TERM GOAL #5   Title Patient will demsotrate 4/5 gross left shoulder strength in available range    Time 4   Period Weeks   Status On-going           PT Long Term Goals - 07/01/16 1103      PT LONG TERM GOAL #1   Title Patient will reach behind back to t-8 without pain in order to  perfrom ADL's    Time 8   Period Weeks   Status New     PT LONG TERM GOAL #2   Title Patient will reach overhead into a cabinet without pain in order to perfrom IADL's    Time 8   Period Weeks   Status New     PT LONG TERM GOAL #3   Title Patient will sleep through the night without pain    Time 8   Period Weeks   Status New     PT LONG TERM GOAL #4   Title Patient will demsotrate a 28% limitation on FOTO    Time 8   Period Weeks   Status New     PT LONG TERM GOAL #5   Title Patient will be inedepenent with light gym program if cleared by MD    Time 8   Period Weeks   Status New               Plan - 08/12/16 1326    Clinical Impression Statement Patient is making good progress. Therapy was able to progress weights. He had no increase in pain after treatment. His flexion range is still limited but his ER/IR mobility is improving.    Rehab Potential Excellent   PT Frequency 2x / week   PT Duration 8 weeks   PT Treatment/Interventions ADLs/Self Care Home Management;Cryotherapy;Electrical Stimulation;Stair training;Gait training;Ultrasound;Moist Heat;Iontophoresis 4mg /ml Dexamethasone;Functional mobility training;Therapeutic activities;Therapeutic exercise;Neuromuscular re-education;Manual techniques;Patient/family education;Dry needling;Splinting;Taping;Vasopneumatic Device   PT Next Visit Plan continue with stretching. Per protocol regain full motion as tolerated No restrictions; reviewe new exercises; add tband retractions and extension with yellow band; Continuee manual stretching;    PT Home Exercise Plan external rotation with wand, ; Supine ABC; Prone row, prone extension, wand flexion    Consulted and Agree with Plan of Care Patient      Patient will benefit from skilled therapeutic intervention in order to improve the following deficits and impairments:  Decreased strength, Decreased mobility, Postural dysfunction, Impaired UE functional use, Decreased  endurance  Visit Diagnosis: Stiffness of left shoulder, not elsewhere classified  Acute pain of left shoulder  Muscle weakness (generalized)  Localized edema     Problem List Patient Active Problem List   Diagnosis Date Noted  . Tear  of left glenoid labrum, posterior 05/13/2016  . Fever 01/20/2011  . PLANTAR FASCIITIS, LEFT 05/04/2010  . HEEL PAIN 05/04/2010  . HALLUX RIGIDUS 05/04/2010    Carney Living PT DPT  08/12/2016, 1:29 PM  [facility] [address] [place], [name], [address] Phone: [phone]   Fax:  [phone]  Name: [name] [name] MRN: [id number] Date of Birth: 05/06/1966

## 2016-08-17 ENCOUNTER — Ambulatory Visit: Payer: 59 | Admitting: Physical Therapy

## 2016-08-17 DIAGNOSIS — R600 Localized edema: Secondary | ICD-10-CM | POA: Diagnosis not present

## 2016-08-17 DIAGNOSIS — M6281 Muscle weakness (generalized): Secondary | ICD-10-CM | POA: Diagnosis not present

## 2016-08-17 DIAGNOSIS — M25512 Pain in left shoulder: Secondary | ICD-10-CM | POA: Diagnosis not present

## 2016-08-17 DIAGNOSIS — M25612 Stiffness of left shoulder, not elsewhere classified: Secondary | ICD-10-CM

## 2016-08-17 NOTE — Therapy (Signed)
[place], [name], [address] Phone: [phone]   Fax:  [phone]  Physical Therapy Treatment  Patient Details  Name: [name] [name] MRN: [id number] Date of Birth: 08/29/1965 Referring Provider: Dr Carter Kitten   Encounter Date: 08/17/2016      PT End of Session - 08/17/16 0948    Visit Number 10   Number of Visits 16   Date for PT Re-Evaluation 08/26/16   Authorization Type [facility]    PT Start Time 0930   PT Stop Time 1030   PT Time Calculation (min) 60 min      Past Medical History:  Diagnosis Date  . Allergy   . Anxiety   . Diverticulitis   . OCD (obsessive compulsive disorder)   . Tear of left glenoid labrum, posterior 05/13/2016    Past Surgical History:  Procedure Laterality Date  . COLONOSCOPY    . PARTIAL COLECTOMY  2009   for diverticulitis  . SHOULDER ARTHROSCOPY WITH BANKART REPAIR Left 05/13/2016   Procedure: SHOULDER ARTHROSCOPY WITH POSTERIOR BANKART REPAIR with extensive debridement, left bursectomy;  Surgeon: Marchia Bond, MD;  Location: [facility];  Service: Orthopedics;  Laterality: Left;  pre/Post Op Scalene block  . WISDOM TOOTH EXTRACTION  1988    There were no vitals filed for this visit.      Subjective Assessment - 08/17/16 1159    Subjective I used my arm to push up in bed and sit has hurt ever since.  I was feeling really good before I did that.    Currently in Pain? Yes   Pain Score 6    Pain Location Shoulder   Pain Orientation Left   Pain Descriptors / Indicators Aching   Aggravating Factors  weight bearing on UE    Pain Relieving Factors ice, rest             [facility] PT Assessment - 08/17/16 0001      PROM   Left Shoulder Flexion 140 Degrees   Left Shoulder Internal Rotation 60 Degrees  @ 45 degrees abdct   Left Shoulder External Rotation 45 Degrees  @ 45 degrees abdct                      [facility] Adult PT  Treatment/Exercise - 08/17/16 0001      Shoulder Exercises: Seated   Other Seated Exercises Table slide for flexion stretch  1 minute x 2    Other Seated Exercises UBE 17mn dforward 263m bacxk   Level 1     Shoulder Exercises: Standing   Other Standing Exercises IR with dowel x 20, AAROM shoulder , UE ranger on wall with step and reach various heights.    Other Standing Exercises wall slide 3 x 30 sec bilateral for stretch     Shoulder Exercises: Pulleys   Flexion 3 minutes  pain with end range      Cryotherapy   Number Minutes Cryotherapy 15 Minutes   Cryotherapy Location Shoulder   Type of Cryotherapy Ice pack     Electrical Stimulation   Electrical Stimulation Location shoulder left x 15 minutes    Electrical Stimulation Action IFC   Electrical Stimulation Parameters 1576m Electrical Stimulation Goals Pain     Manual Therapy   Manual therapy comments PROM flexion, ER, IR                 PT Education -  08/17/16 1041    Education provided Yes   Education Details Table Slide for flexion stretch   Person(s) Educated Patient   Methods Explanation;Demonstration   Comprehension Verbalized understanding;Returned demonstration          PT Short Term Goals - 08/12/16 1328      PT SHORT TERM GOAL #1   Title Patient will increase left shoulder ER by 30 degrees    Baseline 55 degrees    Time 4   Period Weeks   Status On-going     PT SHORT TERM GOAL #2   Title Patient will report 2/10 pain at worst in left shoulder    Baseline moderate   Time 4   Period Weeks   Status On-going     PT SHORT TERM GOAL #3   Title Patient will increase passive flexion by 40 degrees    Baseline 120   Time 4   Period Days   Status On-going     PT SHORT TERM GOAL #4   Title Patient will be independent with initial HEP    Baseline independent   Time 4   Period Weeks   Status On-going     PT SHORT TERM GOAL #5   Title Patient will demsotrate 4/5 gross left shoulder  strength in available range    Time 4   Period Weeks   Status On-going           PT Long Term Goals - 07/01/16 1103      PT LONG TERM GOAL #1   Title Patient will reach behind back to t-8 without pain in order to perfrom ADL's    Time 8   Period Weeks   Status New     PT LONG TERM GOAL #2   Title Patient will reach overhead into a cabinet without pain in order to perfrom IADL's    Time 8   Period Weeks   Status New     PT LONG TERM GOAL #3   Title Patient will sleep through the night without pain    Time 8   Period Weeks   Status New     PT LONG TERM GOAL #4   Title Patient will demsotrate a 28% limitation on FOTO    Time 8   Period Weeks   Status New     PT LONG TERM GOAL #5   Title Patient will be inedepenent with light gym program if cleared by MD    Time 8   Period Weeks   Status New               Plan - 08/17/16 3875    Clinical Impression Statement Pt reports increased pain after bearing weight on left arm while he was in bed several days ago.  He has needed to use ice lately. He has not used pain meds due to their side effects. He is willing to try IFC again however thought it increased his pain last time he used it. We worked on shoulder ROM and instructed him in prolonged table slides for HEP. IFC and ice at end of treatment and pt reports this was very helpful. He has a TENS at home and plans to try it. No additional goals met due to pain exacerbation.    PT Next Visit Plan continue with stretching. Per protocol regain full motion as tolerated No restrictions; reviewe new exercises; add tband retractions and extension with yellow band; Continuee manual stretching;    PT  Home Exercise Plan external rotation with wand, ; Supine ABC; Prone row, prone extension, wand flexion , table slide   Consulted and Agree with Plan of Care Patient      Patient will benefit from skilled therapeutic intervention in order to improve the following deficits and  impairments:  Decreased strength, Decreased mobility, Postural dysfunction, Impaired UE functional use, Decreased endurance  Visit Diagnosis: Stiffness of left shoulder, not elsewhere classified  Acute pain of left shoulder  Muscle weakness (generalized)  Localized edema     Problem List Patient Active Problem List   Diagnosis Date Noted  . Tear of left glenoid labrum, posterior 05/13/2016  . Fever 01/20/2011  . PLANTAR FASCIITIS, LEFT 05/04/2010  . HEEL PAIN 05/04/2010  . HALLUX RIGIDUS 05/04/2010    Dorene Ar , PTA 08/17/2016, 12:03 PM  [facility] [address] [place], [name], [address] Phone: [phone]   Fax:  [phone]  Name: [name] [name] MRN: [id number] Date of Birth: 12/26/1965

## 2016-08-19 ENCOUNTER — Ambulatory Visit: Payer: 59 | Admitting: Physical Therapy

## 2016-08-19 DIAGNOSIS — M6281 Muscle weakness (generalized): Secondary | ICD-10-CM

## 2016-08-19 DIAGNOSIS — M25512 Pain in left shoulder: Secondary | ICD-10-CM

## 2016-08-19 DIAGNOSIS — R600 Localized edema: Secondary | ICD-10-CM

## 2016-08-19 DIAGNOSIS — M25612 Stiffness of left shoulder, not elsewhere classified: Secondary | ICD-10-CM

## 2016-08-19 NOTE — Therapy (Addendum)
[place], [name], [address] Phone: [phone]   Fax:  [phone]  Physical Therapy Treatment  Patient Details  Name: [name] [name] MRN: [id number] Date of Birth: 12/23/1965 Referring Provider: Dr Carter Kitten   Encounter Date: 08/19/2016      PT End of Session - 08/19/16 1157    Visit Number 11   Number of Visits 16   Date for PT Re-Evaluation 08/26/16   Authorization Type [facility]    PT Start Time 1200   PT Stop Time 4315   PT Time Calculation (min) 44 min      Past Medical History:  Diagnosis Date  . Allergy   . Anxiety   . Diverticulitis   . OCD (obsessive compulsive disorder)   . Tear of left glenoid labrum, posterior 05/13/2016    Past Surgical History:  Procedure Laterality Date  . COLONOSCOPY    . PARTIAL COLECTOMY  2009   for diverticulitis  . SHOULDER ARTHROSCOPY WITH BANKART REPAIR Left 05/13/2016   Procedure: SHOULDER ARTHROSCOPY WITH POSTERIOR BANKART REPAIR with extensive debridement, left bursectomy;  Surgeon: Marchia Bond, MD;  Location: [facility];  Service: Orthopedics;  Laterality: Left;  pre/Post Op Scalene block  . WISDOM TOOTH EXTRACTION  1988    There were no vitals filed for this visit.      Subjective Assessment - 08/19/16 1200    Subjective My shoulder is better today. I did not need ice    Currently in Pain? Yes   Pain Score 4    Pain Location Shoulder   Pain Orientation Left   Pain Descriptors / Indicators Aching            [facility] PT Assessment - 08/19/16 0001      PROM   Left Shoulder Flexion 135 Degrees   Left Shoulder Internal Rotation 60 Degrees   Left Shoulder External Rotation 55 Degrees                     [facility] Adult PT Treatment/Exercise - 08/19/16 0001      Shoulder Exercises: Seated   Other Seated Exercises UBE 23mn dforward 218m bacxk   Level 1     Shoulder Exercises: Prone   Retraction Limitations  2x10 3lb    Extension Limitations 2 lb 2x10    Horizontal ABduction 1 20 reps   Horizontal ABduction 1 Limitations with ER x10   Other Prone Exercises prone Y, limited motion x 10      Shoulder Exercises: Sidelying   External Rotation 20 reps   External Rotation Weight (lbs) 2   Other Sidelying Exercises sleeper stretch 15 sec x 3      Shoulder Exercises: Stretch   Cross Chest Stretch 3 reps;30 seconds   Cross Chest Stretch Limitations supine   Internal Rotation Stretch 30 seconds   Internal Rotation Stretch Limitations standing with opp UE assist   Star Gazer Stretch 3 reps;30 seconds  supine with over pressure      Manual Therapy   Manual therapy comments PROM flexion, ER, IR                 PT Education - 08/19/16 1244    Education provided Yes   Education Details Stretching HEP    Person(s) Educated Patient   Methods Explanation;Handout   Comprehension Verbalized understanding          PT Short Term Goals - 08/19/16 1312  PT SHORT TERM GOAL #1   Title Patient will increase left shoulder ER by 30 degrees    Time 4   Period Weeks   Status Achieved     PT SHORT TERM GOAL #2   Title Patient will report 2/10 pain at worst in left shoulder    Baseline 4/10   Time 4   Status On-going     PT SHORT TERM GOAL #3   Title Patient will increase passive flexion by 40 degrees    Baseline 140   Time 4   Period Days   Status Achieved     PT SHORT TERM GOAL #4   Title Patient will be independent with initial HEP    Time 4   Period Weeks   Status Achieved     PT SHORT TERM GOAL #5   Title Patient will demsotrate 4/5 gross left shoulder strength in available range    Time 4   Period Weeks   Status On-going           PT Long Term Goals - 07/01/16 1103      PT LONG TERM GOAL #1   Title Patient will reach behind back to t-8 without pain in order to perfrom ADL's    Time 8   Period Weeks   Status New     PT LONG TERM GOAL #2   Title Patient  will reach overhead into a cabinet without pain in order to perfrom IADL's    Time 8   Period Weeks   Status New     PT LONG TERM GOAL #3   Title Patient will sleep through the night without pain    Time 8   Period Weeks   Status New     PT LONG TERM GOAL #4   Title Patient will demsotrate a 28% limitation on FOTO    Time 8   Period Weeks   Status New     PT LONG TERM GOAL #5   Title Patient will be inedepenent with light gym program if cleared by MD    Time 8   Period Weeks   Status New               Plan - 08/19/16 1245    Clinical Impression Statement Less pain today. He thinks IFC was helpful. He has not needed ice since last visit. He has worked on [employer], as he does not have a rolling chair at home. Instructed pt in cross chest stretch, star gazer stretch, and IR stretch behind back per protocol for HEP. STG#1, #3, #4 Met.    PT Next Visit Plan continue with stretching-review new HEP stretches. Per protocol regain full motion as tolerated No restrictions; reviewe new exercises; add tband retractions and extension with yellow band; Continuee manual stretching;    PT Home Exercise Plan external rotation with wand, ; Supine ABC; Prone row, prone extension, wand flexion , table slide, star gazer, cross chest stretch, IR behind back stretch    Consulted and Agree with Plan of Care Patient      Patient will benefit from skilled therapeutic intervention in order to improve the following deficits and impairments:  Decreased strength, Decreased mobility, Postural dysfunction, Impaired UE functional use, Decreased endurance  Visit Diagnosis: Stiffness of left shoulder, not elsewhere classified  Acute pain of left shoulder  Muscle weakness (generalized)  Localized edema     Problem List Patient Active Problem List   Diagnosis Date Noted  .  Tear of left glenoid labrum, posterior 05/13/2016  . Fever 01/20/2011  . PLANTAR FASCIITIS, LEFT 05/04/2010  . HEEL  PAIN 05/04/2010  . HALLUX RIGIDUS 05/04/2010    Dorene Ar, PTA 08/19/2016, 1:20 PM  [facility] [address] [place], [name], [address] Phone: [phone]   Fax:  [phone]  Name: [name] [name] MRN: [id number] Date of Birth: 1966/01/09

## 2016-08-24 ENCOUNTER — Ambulatory Visit: Payer: 59 | Admitting: Physical Therapy

## 2016-08-24 DIAGNOSIS — R600 Localized edema: Secondary | ICD-10-CM | POA: Diagnosis not present

## 2016-08-24 DIAGNOSIS — M25612 Stiffness of left shoulder, not elsewhere classified: Secondary | ICD-10-CM

## 2016-08-24 DIAGNOSIS — M25512 Pain in left shoulder: Secondary | ICD-10-CM | POA: Diagnosis not present

## 2016-08-24 DIAGNOSIS — M6281 Muscle weakness (generalized): Secondary | ICD-10-CM | POA: Diagnosis not present

## 2016-08-24 NOTE — Therapy (Signed)
[name], [name], [address] Phone: [phone]   Fax:  [phone]  Physical Therapy Treatment  Patient Details  Name: [name] [name] MRN: [id number] Date of Birth: August 26, 1965 Referring Provider: Dr Carter Kitten   Encounter Date: 08/24/2016      PT End of Session - 08/24/16 0847    Visit Number 12   Number of Visits 16   Date for PT Re-Evaluation 08/26/16   Authorization Type [facility]    PT Start Time 0845   PT Stop Time 0945   PT Time Calculation (min) 60 min      Past Medical History:  Diagnosis Date  . Allergy   . Anxiety   . Diverticulitis   . OCD (obsessive compulsive disorder)   . Tear of left glenoid labrum, posterior 05/13/2016    Past Surgical History:  Procedure Laterality Date  . COLONOSCOPY    . PARTIAL COLECTOMY  2009   for diverticulitis  . SHOULDER ARTHROSCOPY WITH BANKART REPAIR Left 05/13/2016   Procedure: SHOULDER ARTHROSCOPY WITH POSTERIOR BANKART REPAIR with extensive debridement, left bursectomy;  Surgeon: Marchia Bond, MD;  Location: [facility];  Service: Orthopedics;  Laterality: Left;  pre/Post Op Scalene block  . WISDOM TOOTH EXTRACTION  1988    There were no vitals filed for this visit.      Subjective Assessment - 08/24/16 0846    Subjective My shoulder has been hurting more the last couple of days. I am not sure why.    Currently in Pain? Yes   Pain Score 6    Pain Location Shoulder   Pain Orientation Left   Pain Descriptors / Indicators Aching   Aggravating Factors  weight bearing, over stretching   Pain Relieving Factors ice, rest            [facility] PT Assessment - 08/24/16 0001      ROM / Strength   AROM / PROM / Strength AROM     AROM   AROM Assessment Site Shoulder   Right/Left Shoulder Left   Left Shoulder Flexion 125 Degrees   Left Shoulder ABduction 105 Degrees   Left Shoulder Internal Rotation --  reach to L1   Left  Shoulder External Rotation --  reach to T2                      [facility] Adult PT Treatment/Exercise - 08/24/16 0001      Shoulder Exercises: Supine   Other Supine Exercises supine yellow band scap stab series with horiz abdct, pullovers, ER and sash      Shoulder Exercises: Prone   Retraction Limitations 2x10 3lb    Extension Limitations 3 lb 2x10    Horizontal ABduction 1 20 reps   Horizontal ABduction 1 Limitations with ER x10  elbow bent x 10, elbow extended x 10   Other Prone Exercises prone Y, limited motion x 10      Shoulder Exercises: Sidelying   External Rotation 20 reps   External Rotation Weight (lbs) 2     Shoulder Exercises: Standing   Flexion 15 reps;Theraband   Theraband Level (Shoulder Flexion) Level 2 (Red)   Flexion Limitations punch    Other Standing Exercises cabinet reach top shelf, increased superior/anterior shoulder pain.    Other Standing Exercises bicep curls red x 20      Shoulder Exercises: Pulleys   Flexion 3 minutes     Cryotherapy  Number Minutes Cryotherapy 15 Minutes   Cryotherapy Location Shoulder   Type of Cryotherapy Ice pack     Electrical Stimulation   Electrical Stimulation Location shoulder left x 15 minutes    Electrical Stimulation Action IFC x 15 min   Electrical Stimulation Parameters 17 ma   Electrical Stimulation Goals Pain     Manual Therapy   Manual therapy comments PROM flexion, ER, IR                 PT Education - 08/24/16 0947    Education provided Yes   Education Details HEP   Person(s) Educated Patient   Methods Explanation;Handout   Comprehension Verbalized understanding          PT Short Term Goals - 08/19/16 1312      PT SHORT TERM GOAL #1   Title Patient will increase left shoulder ER by 30 degrees    Time 4   Period Weeks   Status Achieved     PT SHORT TERM GOAL #2   Title Patient will report 2/10 pain at worst in left shoulder    Baseline 4/10   Time 4   Status  On-going     PT SHORT TERM GOAL #3   Title Patient will increase passive flexion by 40 degrees    Baseline 140   Time 4   Period Days   Status Achieved     PT SHORT TERM GOAL #4   Title Patient will be independent with initial HEP    Time 4   Period Weeks   Status Achieved     PT SHORT TERM GOAL #5   Title Patient will demsotrate 4/5 gross left shoulder strength in available range    Time 4   Period Weeks   Status On-going           PT Long Term Goals - 08/24/16 [id number]      PT LONG TERM GOAL #1   Title Patient will reach behind back to t-8 without pain in order to perfrom ADL's    Baseline L-1 with pain   Time 8   Period Weeks   Status On-going     PT LONG TERM GOAL #2   Title Patient will reach overhead into a cabinet without pain in order to perfrom IADL's    Baseline can reach overhead with pain   Time 8   Period Weeks   Status On-going     PT LONG TERM GOAL #3   Title Patient will sleep through the night without pain    Baseline recent difficulty sleeping   Time 8   Period Weeks   Status On-going     PT LONG TERM GOAL #4   Title Patient will demsotrate a 28% limitation on FOTO    Time 8   Period Weeks   Status Unable to assess     PT LONG TERM GOAL #5   Title Patient will be inedepenent with light gym program if cleared by MD    Time 8   Period Weeks   Status On-going               Plan - 08/24/16 [id number]    Clinical Impression Statement Increased pain for unknown reason last couple of days. More pain with sleep. Able to reach in over head cabinet and behind back however causes increased pain. Added theraband bicep curls and puches today without increased pain. Updated HEP.  IFC and ice used at end  of treatment. Pt reports decreased pain at end of session.    PT Next Visit Plan FOTO; continue with stretching-review new HEP stretches. Per protocol regain full motion as tolerated No restrictions; reviewe new exercises; add tband retractions and  extension with yellow band; Continuee manual stretching;    PT Home Exercise Plan external rotation with wand, ; Supine ABC; Prone row, prone extension, wand flexion , table slide, star gazer, cross chest stretch, IR behind back stretch; theraband puch and bicep curls    Consulted and Agree with Plan of Care Patient      Patient will benefit from skilled therapeutic intervention in order to improve the following deficits and impairments:  Decreased strength, Decreased mobility, Postural dysfunction, Impaired UE functional use, Decreased endurance  Visit Diagnosis: Stiffness of left shoulder, not elsewhere classified  Acute pain of left shoulder  Muscle weakness (generalized)  Localized edema     Problem List Patient Active Problem List   Diagnosis Date Noted  . Tear of left glenoid labrum, posterior 05/13/2016  . Fever 01/20/2011  . PLANTAR FASCIITIS, LEFT 05/04/2010  . HEEL PAIN 05/04/2010  . HALLUX RIGIDUS 05/04/2010    Dorene Ar, PTA 08/24/2016, 9:59 AM  [place], [name], [address] Phone: [phone]   Fax:  [phone]  Name: [name] [name] MRN: [id number] Date of Birth: [birth year]

## 2016-08-24 NOTE — Patient Instructions (Signed)
Elbow Flexion: Resisted   STAND OR SIT - RED BAND  With tubing wrapped around left fist and other end secured under foot, curl arm up as far as possible. Repeat _20___ times per set. Do __1__ sets per session. Do _2___ sessions per day.  Copyright  VHI. All rights reserved.   Resistance: Single Arm Punch    Stand with elbow bent, facing away from door. Slowly puush left arm forward through a punching motion. . Fully straighten elbow. Slowly return Repeat 10-20___ times. Repeat with other arm for set.Do _1__ sets per session. Do 2 sessions per day

## 2016-08-26 ENCOUNTER — Ambulatory Visit: Payer: 59 | Admitting: Physical Therapy

## 2016-08-27 ENCOUNTER — Ambulatory Visit: Payer: 59 | Admitting: Physical Therapy

## 2016-08-27 VITALS — BP 127/73 | HR 87

## 2016-08-27 DIAGNOSIS — M25612 Stiffness of left shoulder, not elsewhere classified: Secondary | ICD-10-CM | POA: Diagnosis not present

## 2016-08-27 DIAGNOSIS — M25512 Pain in left shoulder: Secondary | ICD-10-CM | POA: Diagnosis not present

## 2016-08-27 DIAGNOSIS — M6281 Muscle weakness (generalized): Secondary | ICD-10-CM | POA: Diagnosis not present

## 2016-08-27 DIAGNOSIS — R600 Localized edema: Secondary | ICD-10-CM

## 2016-08-27 NOTE — Therapy (Signed)
[place], [name], [address] Phone: [phone]   Fax:  [phone]  Physical Therapy Treatment  Patient Details  Name: [name] [name] MRN: [id number] Date of Birth: 09/24/1965 Referring Provider: Dr Carter Kitten   Encounter Date: 08/27/2016      PT End of Session - 08/27/16 0900    Visit Number 13   Number of Visits 16   Date for PT Re-Evaluation 08/26/16   Authorization Type [facility]    PT Start Time 0845   PT Stop Time 0945   PT Time Calculation (min) 60 min      Past Medical History:  Diagnosis Date  . Allergy   . Anxiety   . Diverticulitis   . OCD (obsessive compulsive disorder)   . Tear of left glenoid labrum, posterior 05/13/2016    Past Surgical History:  Procedure Laterality Date  . COLONOSCOPY    . PARTIAL COLECTOMY  2009   for diverticulitis  . SHOULDER ARTHROSCOPY WITH BANKART REPAIR Left 05/13/2016   Procedure: SHOULDER ARTHROSCOPY WITH POSTERIOR BANKART REPAIR with extensive debridement, left bursectomy;  Surgeon: Marchia Bond, MD;  Location: [facility];  Service: Orthopedics;  Laterality: Left;  pre/Post Op Scalene block  . WISDOM TOOTH EXTRACTION  1988    Vitals:   08/27/16 1000  BP: 127/73  Pulse: 87  SpO2: 98%        Subjective Assessment - 08/27/16 0849    Subjective My pain is worse in the morning.    Currently in Pain? Yes   Pain Score 6    Pain Location Shoulder   Pain Orientation Left   Pain Descriptors / Indicators Aching            [facility] PT Assessment - 08/27/16 0001      AROM   Left Shoulder Flexion 125 Degrees   Left Shoulder ABduction 105 Degrees   Left Shoulder Internal Rotation --  reach to L1   Left Shoulder External Rotation --  reach to T2      PROM   Left Shoulder Flexion 135 Degrees  end range pain   Left Shoulder ABduction 110 Degrees  end range pain   Left Shoulder Internal Rotation 60 Degrees   Left Shoulder  External Rotation 55 Degrees                     [facility] Adult PT Treatment/Exercise - 08/27/16 0001      Shoulder Exercises: Seated   External Rotation Limitations 2x10 red   Internal Rotation Limitations 2x10 red      Shoulder Exercises: Standing   Flexion 15 reps;Theraband   Theraband Level (Shoulder Flexion) Level 2 (Red)   Flexion Limitations punch    Other Standing Exercises wall slide 3 x 20 sec      Shoulder Exercises: Pulleys   Flexion 3 minutes     Cryotherapy   Number Minutes Cryotherapy 15 Minutes   Cryotherapy Location Shoulder   Type of Cryotherapy Ice pack     Electrical Stimulation   Electrical Stimulation Location shoulder left x 15 minutes   supine    Electrical Stimulation Action IFC x 15 min   Electrical Stimulation Parameters 15 ma   Electrical Stimulation Goals Pain     Manual Therapy   Manual therapy comments PROM flexion, ER, IR                   PT Short Term  Goals - 08/27/16 0955      PT SHORT TERM GOAL #1   Title Patient will increase left shoulder ER by 30 degrees    Baseline 55 degrees    Time 4   Period Weeks   Status Achieved     PT SHORT TERM GOAL #2   Title Patient will report 2/10 pain at worst in left shoulder    Baseline 4/10-7/10   Time 4   Period Weeks   Status On-going     PT SHORT TERM GOAL #3   Title Patient will increase passive flexion by 40 degrees    Baseline 135   Time 4   Period Days   Status Achieved     PT SHORT TERM GOAL #4   Title Patient will be independent with initial HEP    Baseline independent   Time 4   Period Weeks   Status Achieved     PT SHORT TERM GOAL #5   Title Patient will demsotrate 4/5 gross left shoulder strength in available range    Period Weeks   Status Unable to assess           PT Long Term Goals - 08/24/16 0953      PT LONG TERM GOAL #1   Title Patient will reach behind back to t-8 without pain in order to perfrom ADL's    Baseline L-1 with pain    Time 8   Period Weeks   Status On-going     PT LONG TERM GOAL #2   Title Patient will reach overhead into a cabinet without pain in order to perfrom IADL's    Baseline can reach overhead with pain   Time 8   Period Weeks   Status On-going     PT LONG TERM GOAL #3   Title Patient will sleep through the night without pain    Baseline recent difficulty sleeping   Time 8   Period Weeks   Status On-going     PT LONG TERM GOAL #4   Title Patient will demsotrate a 28% limitation on FOTO    Time 8   Period Weeks   Status Unable to assess     PT LONG TERM GOAL #5   Title Patient will be inedepenent with light gym program if cleared by MD    Time 8   Period Weeks   Status On-going               Plan - 08/27/16 0915    Clinical Impression Statement Pt reports pain still bothersome at night and with first waking in the morning. His most increase pain is with overhead reaching and is located anterior/lateral shoulder. He will be out of town for next 2 weeks and plans to continue his HEP. I encouraged him to keep stretches comforatable and stop reps if exercises are painful. Pt has redness on his skin at left shoulder from ice packs he has been placing directly on is skin. He has placed bandaids on the area. Advised pt to try a pillowcase to prevent burning of his skin.,    PT Next Visit Plan FOTO; pt will be out of toww for 2 weeks.  continue with stretching-review new HEP stretches. Per protocol regain full motion as tolerated No restrictions; reviewe new exercises; add tband retractions and extension with yellow band; Continuee manual stretching;    PT Home Exercise Plan external rotation with wand, ; Supine ABC; Prone row, prone extension, wand flexion ,  table slide, star gazer, cross chest stretch, IR behind back stretch; theraband puch and bicep curls    Consulted and Agree with Plan of Care Patient      Patient will benefit from skilled therapeutic intervention in order to  improve the following deficits and impairments:  Decreased strength, Decreased mobility, Postural dysfunction, Impaired UE functional use, Decreased endurance  Visit Diagnosis: Stiffness of left shoulder, not elsewhere classified  Acute pain of left shoulder  Muscle weakness (generalized)  Localized edema     Problem List Patient Active Problem List   Diagnosis Date Noted  . Tear of left glenoid labrum, posterior 05/13/2016  . Fever 01/20/2011  . PLANTAR FASCIITIS, LEFT 05/04/2010  . HEEL PAIN 05/04/2010  . HALLUX RIGIDUS 05/04/2010    Dorene Ar, PTA 08/27/2016, 12:17 PM  [place], [name], [address] Phone: [phone]   Fax:  [phone]  Name: [name] [name] MRN: [id number] Date of Birth: 09-Jul-1966

## 2016-09-06 MED FILL — clonazePAM 1 MG TABS: 1 | 30 days supply | Qty: 60 | Fill #1

## 2016-09-10 ENCOUNTER — Encounter: Payer: Self-pay | Admitting: Physical Therapy

## 2016-09-10 ENCOUNTER — Ambulatory Visit: Payer: 59 | Attending: Orthopedic Surgery | Admitting: Physical Therapy

## 2016-09-10 DIAGNOSIS — M6281 Muscle weakness (generalized): Secondary | ICD-10-CM | POA: Insufficient documentation

## 2016-09-10 DIAGNOSIS — R600 Localized edema: Secondary | ICD-10-CM | POA: Diagnosis not present

## 2016-09-10 DIAGNOSIS — M25512 Pain in left shoulder: Secondary | ICD-10-CM | POA: Diagnosis not present

## 2016-09-10 DIAGNOSIS — M25612 Stiffness of left shoulder, not elsewhere classified: Secondary | ICD-10-CM | POA: Insufficient documentation

## 2016-09-10 NOTE — Addendum Note (Signed)
Addended by: Carney Living on: 09/10/2016 09:37 AM   Modules accepted: Orders

## 2016-09-10 NOTE — Therapy (Signed)
[place], [name], [address] Phone: [phone]   Fax:  [phone]  Physical Therapy Treatment  Patient Details  Name: [name] [name] MRN: [id number] Date of Birth: 06/13/1966 Referring Provider: Dr Carter Kitten   Encounter Date: 09/10/2016      PT End of Session - 09/10/16 1307    Visit Number 14   Number of Visits 24   Date for PT Re-Evaluation 11/05/16   Authorization Type [facility]    PT Start Time [id number]   PT Stop Time 1105   PT Time Calculation (min) 50 min   Activity Tolerance Patient tolerated treatment well   Behavior During Therapy [facility] for tasks assessed/performed      Past Medical History:  Diagnosis Date  . Allergy   . Anxiety   . Diverticulitis   . OCD (obsessive compulsive disorder)   . Tear of left glenoid labrum, posterior 05/13/2016    Past Surgical History:  Procedure Laterality Date  . COLONOSCOPY    . PARTIAL COLECTOMY  2009   for diverticulitis  . SHOULDER ARTHROSCOPY WITH BANKART REPAIR Left 05/13/2016   Procedure: SHOULDER ARTHROSCOPY WITH POSTERIOR BANKART REPAIR with extensive debridement, left bursectomy;  Surgeon: Marchia Bond, MD;  Location: [facility];  Service: Orthopedics;  Laterality: Left;  pre/Post Op Scalene block  . WISDOM TOOTH EXTRACTION  1988    There were no vitals filed for this visit.      Subjective Assessment - 09/10/16 1306    Subjective Patiet has been on vaction for 2 weeks. He has not been able to do his exercises as much. he reports it is still a little sore but has improved significantly.    Limitations Walking;Standing   Diagnostic tests Nothing post op   Patient Stated Goals return to normal use of his left arm    Currently in Pain? Yes   Pain Score 2    Pain Location Shoulder   Pain Orientation Left   Pain Descriptors / Indicators Aching   Pain Type Surgical pain   Pain Onset More than a month ago   Pain Frequency  Constant   Aggravating Factors  weight bearing, over stretching    Pain Relieving Factors ice, rest    Effect of Pain on Daily Activities difficulty perfroming ADL's             [facility] PT Assessment - 09/10/16 0001      AROM   Left Shoulder Flexion 125 Degrees   Left Shoulder ABduction 105 Degrees   Left Shoulder Internal Rotation --  reach to L1   Left Shoulder External Rotation --  reach to T2      PROM   Left Shoulder Flexion 135 Degrees  end range pain   Left Shoulder ABduction 110 Degrees  end range pain   Left Shoulder Internal Rotation 60 Degrees   Left Shoulder External Rotation 55 Degrees                     [facility] Adult PT Treatment/Exercise - 09/10/16 0001      Shoulder Exercises: Seated   External Rotation Limitations 2x10 green    Internal Rotation Limitations 2x10 green     Shoulder Exercises: Standing   Other Standing Exercises wall slide 3 x 20 sec      Shoulder Exercises: Pulleys   Flexion 3 minutes     Shoulder Exercises: Stretch   Cross Chest Stretch 3 reps;30  seconds   Cross Chest Stretch Limitations supine   Internal Rotation Stretch 30 seconds   Internal Rotation Stretch Limitations standing with towel and mulligan assist      Cryotherapy   Number Minutes Cryotherapy 10 Minutes   Cryotherapy Location Shoulder   Type of Cryotherapy Ice pack     Electrical Stimulation   Electrical Stimulation Location --  supine      Manual Therapy   Manual therapy comments PROM flexion, ER, IR; Gentle grade II and II PA mobs for IR                 PT Education - 09/10/16 1307    Education provided Yes   Education Details continue with IR stretching at home    Person(s) Educated Parent(s)   Methods Explanation;Verbal cues   Comprehension Verbalized understanding;Need further instruction          PT Short Term Goals - 08/27/16 0955      PT SHORT TERM GOAL #1   Title Patient will increase left shoulder ER by 30 degrees     Baseline 55 degrees    Time 4   Period Weeks   Status Achieved     PT SHORT TERM GOAL #2   Title Patient will report 2/10 pain at worst in left shoulder    Baseline 4/10-7/10   Time 4   Period Weeks   Status On-going     PT SHORT TERM GOAL #3   Title Patient will increase passive flexion by 40 degrees    Baseline 135   Time 4   Period Days   Status Achieved     PT SHORT TERM GOAL #4   Title Patient will be independent with initial HEP    Baseline independent   Time 4   Period Weeks   Status Achieved     PT SHORT TERM GOAL #5   Title Patient will demsotrate 4/5 gross left shoulder strength in available range    Period Weeks   Status Unable to assess           PT Long Term Goals - 08/24/16 0953      PT LONG TERM GOAL #1   Title Patient will reach behind back to t-8 without pain in order to perfrom ADL's    Baseline L-1 with pain   Time 8   Period Weeks   Status On-going     PT LONG TERM GOAL #2   Title Patient will reach overhead into a cabinet without pain in order to perfrom IADL's    Baseline can reach overhead with pain   Time 8   Period Weeks   Status On-going     PT LONG TERM GOAL #3   Title Patient will sleep through the night without pain    Baseline recent difficulty sleeping   Time 8   Period Weeks   Status On-going     PT LONG TERM GOAL #4   Title Patient will demsotrate a 28% limitation on FOTO    Time 8   Period Weeks   Status Unable to assess     PT LONG TERM GOAL #5   Title Patient will be inedepenent with light gym program if cleared by MD    Time 8   Period Weeks   Status On-going               Plan - 09/10/16 1308    Clinical Impression Statement Patient didi not lose much  range while on vaction. Therapy spoke with him about the fact he may be overstretching at home if he had mcuh less pain when he was on vaction. He was given an IR stretch at home but was advised not to overstretch. Therapy talked with him about a few  more visits then a likley discharge to HEP.     Rehab Potential Good   PT Frequency 2x / week   PT Duration 8 weeks   PT Treatment/Interventions ADLs/Self Care Home Management;Cryotherapy;Electrical Stimulation;Stair training;Gait training;Ultrasound;Moist Heat;Iontophoresis 4mg /ml Dexamethasone;Functional mobility training;Therapeutic activities;Therapeutic exercise;Neuromuscular re-education;Manual techniques;Patient/family education;Dry needling;Splinting;Taping;Vasopneumatic Device   PT Next Visit Plan FOTO; pt will be out of toww for 2 weeks.  continue with stretching-review new HEP stretches. Per protocol regain full motion as tolerated No restrictions; reviewe new exercises; add tband retractions and extension with yellow band; Continuee manual stretching;    PT Home Exercise Plan external rotation with wand, ; Supine ABC; Prone row, prone extension, wand flexion , table slide, star gazer, cross chest stretch, IR behind back stretch; theraband puch and bicep curls    Consulted and Agree with Plan of Care Patient      Patient will benefit from skilled therapeutic intervention in order to improve the following deficits and impairments:  Decreased strength, Decreased mobility, Postural dysfunction, Impaired UE functional use, Decreased endurance  Visit Diagnosis: Stiffness of left shoulder, not elsewhere classified  Acute pain of left shoulder  Muscle weakness (generalized)     Problem List Patient Active Problem List   Diagnosis Date Noted  . Tear of left glenoid labrum, posterior 05/13/2016  . Fever 01/20/2011  . PLANTAR FASCIITIS, LEFT 05/04/2010  . HEEL PAIN 05/04/2010  . HALLUX RIGIDUS 05/04/2010    Carney Living PT DPT  09/10/2016, 1:11 PM  [facility] [address] [place], [name], [address] Phone: [phone]   Fax:  [phone]  Name: [name] [name] MRN: [id number] Date of Birth: August 22, 1965

## 2016-09-21 ENCOUNTER — Encounter: Payer: Self-pay | Admitting: Physical Therapy

## 2016-09-21 ENCOUNTER — Ambulatory Visit: Payer: 59 | Admitting: Physical Therapy

## 2016-09-21 DIAGNOSIS — M25612 Stiffness of left shoulder, not elsewhere classified: Secondary | ICD-10-CM

## 2016-09-21 DIAGNOSIS — M25512 Pain in left shoulder: Secondary | ICD-10-CM | POA: Diagnosis not present

## 2016-09-21 DIAGNOSIS — M6281 Muscle weakness (generalized): Secondary | ICD-10-CM | POA: Diagnosis not present

## 2016-09-21 DIAGNOSIS — R600 Localized edema: Secondary | ICD-10-CM | POA: Diagnosis not present

## 2016-09-21 NOTE — Therapy (Addendum)
[place], [name], [address] Phone: [phone]   Fax:  [phone]  Physical Therapy Treatment/ Discharge   Patient Details  Name: [name] [name] MRN: [id number] Date of Birth: 1966/01/06 Referring Provider: Dr Carter Kitten   Encounter Date: 09/21/2016      PT End of Session - 09/21/16 1044    Visit Number 15   Number of Visits 24   Date for PT Re-Evaluation 11/05/16   Authorization Type [facility]    PT Start Time 1020   PT Stop Time 1100   PT Time Calculation (min) 40 min   Activity Tolerance Patient tolerated treatment well   Behavior During Therapy [facility] for tasks assessed/performed      Past Medical History:  Diagnosis Date  . Allergy   . Anxiety   . Diverticulitis   . OCD (obsessive compulsive disorder)   . Tear of left glenoid labrum, posterior 05/13/2016    Past Surgical History:  Procedure Laterality Date  . COLONOSCOPY    . PARTIAL COLECTOMY  2009   for diverticulitis  . SHOULDER ARTHROSCOPY WITH BANKART REPAIR Left 05/13/2016   Procedure: SHOULDER ARTHROSCOPY WITH POSTERIOR BANKART REPAIR with extensive debridement, left bursectomy;  Surgeon: Marchia Bond, MD;  Location: [place];  Service: Orthopedics;  Laterality: Left;  pre/Post Op Scalene block  . WISDOM TOOTH EXTRACTION  1988    There were no vitals filed for this visit.      Subjective Assessment - 09/21/16 1023    Subjective Patient ia having some posterior soreness. He feels like he may have put some weight through it. He did not have to takepain medications but he hads a few  nights were it has been sore.    Limitations Walking;Standing   Diagnostic tests Nothing post op   Patient Stated Goals return to normal use of his left arm    Currently in Pain? Yes   Pain Score 2    Pain Location Shoulder   Pain Orientation Left   Pain Descriptors / Indicators Aching   Pain Type Surgical pain   Pain Onset  More than a month ago   Aggravating Factors  weight bearing, over stretching    Pain Relieving Factors ice and rest    Effect of Pain on Daily Activities difficulty perfroming ADL's    Multiple Pain Sites No                                 PT Education - 09/21/16 1025    Education provided Yes   Education Details continue with stretching and strengthening    Person(s) Educated Patient   Methods Explanation;Verbal cues   Comprehension Verbalized understanding;Returned demonstration          PT Short Term Goals - 09/21/16 1124      PT SHORT TERM GOAL #1   Title Patient will increase left shoulder ER by 30 degrees    Baseline 55 degrees    Time 4   Period Weeks   Status Achieved     PT SHORT TERM GOAL #2   Title Patient will report 2/10 pain at worst in left shoulder    Baseline 4/10-7/10   Time 4   Period Weeks   Status On-going     PT SHORT TERM GOAL #3   Title Patient will increase passive flexion by 40 degrees    Baseline  135   Time 4   Period Weeks   Status Achieved     PT SHORT TERM GOAL #4   Title Patient will be independent with initial HEP    Baseline independent   Time 4   Period Weeks   Status Achieved     PT SHORT TERM GOAL #5   Title Patient will demsotrate 4/5 gross left shoulder strength in available range    Baseline not tested    Time 4   Period Weeks   Status On-going           PT Long Term Goals - 08/24/16 5465      PT LONG TERM GOAL #1   Title Patient will reach behind back to t-8 without pain in order to perfrom ADL's    Baseline L-1 with pain   Time 8   Period Weeks   Status On-going     PT LONG TERM GOAL #2   Title Patient will reach overhead into a cabinet without pain in order to perfrom IADL's    Baseline can reach overhead with pain   Time 8   Period Weeks   Status On-going     PT LONG TERM GOAL #3   Title Patient will sleep through the night without pain    Baseline recent difficulty  sleeping   Time 8   Period Weeks   Status On-going     PT LONG TERM GOAL #4   Title Patient will demsotrate a 28% limitation on FOTO    Time 8   Period Weeks   Status Unable to assess     PT LONG TERM GOAL #5   Title Patient will be inedepenent with light gym program if cleared by MD    Time 8   Period Weeks   Status On-going               Plan - 09/21/16 1120    Clinical Impression Statement Patient continues to have improved range but he is now having a clicking in his AC joint. He had some soreness today with ER IR. He was advised to stretch lightly at home and only do exercises that dont cause any soreness.    Rehab Potential Good   PT Frequency 2x / week   PT Duration 8 weeks   PT Treatment/Interventions ADLs/Self Care Home Management;Cryotherapy;Electrical Stimulation;Stair training;Gait training;Ultrasound;Moist Heat;Iontophoresis 26m/ml Dexamethasone;Functional mobility training;Therapeutic activities;Therapeutic exercise;Neuromuscular re-education;Manual techniques;Patient/family education;Dry needling;Splinting;Taping;Vasopneumatic Device   PT Next Visit Plan FOTO; pt will be out of toww for 2 weeks.  continue with stretching-review new HEP stretches. Per protocol regain full motion as tolerated No restrictions; reviewe new exercises; add tband retractions and extension with yellow band; Continuee manual stretching;    PT Home Exercise Plan external rotation with wand, ; Supine ABC; Prone row, prone extension, wand flexion , table slide, star gazer, cross chest stretch, IR behind back stretch; theraband puch and bicep curls    Consulted and Agree with Plan of Care Patient      Patient will benefit from skilled therapeutic intervention in order to improve the following deficits and impairments:  Decreased strength, Decreased mobility, Postural dysfunction, Impaired UE functional use, Decreased endurance  Visit Diagnosis: Stiffness of left shoulder, not elsewhere  classified  Acute pain of left shoulder  Muscle weakness (generalized)  Localized edema    PHYSICAL THERAPY DISCHARGE SUMMARY  Visits from Start of Care: 15  Current functional level related to goals / functional outcomes: At time of  discharge minor pain but improved function  Remaining deficits: Pain with moderate to heavy activity    Education / Equipment: HEP Plan: Patient agrees to discharge.  Patient goals were not met. Patient is being discharged due to being pleased with the current functional level.  ?????      Problem List Patient Active Problem List   Diagnosis Date Noted  . Tear of left glenoid labrum, posterior 05/13/2016  . Fever 01/20/2011  . PLANTAR FASCIITIS, LEFT 05/04/2010  . HEEL PAIN 05/04/2010  . HALLUX RIGIDUS 05/04/2010    Carney Living PT DPT  09/21/2016, 11:30 AM  [facility] [address] [place], [name], [address] Phone: [phone]   Fax:  [phone]  Name: [name] [name] MRN: [id number] Date of Birth: May 20, 1966

## 2016-09-23 ENCOUNTER — Ambulatory Visit: Payer: 59 | Admitting: Physical Therapy

## 2016-10-06 MED FILL — clonazePAM 1 MG TABS: 1 | 30 days supply | Qty: 60 | Fill #2

## 2016-11-01 MED FILL — clonazePAM 1 MG TABS: 1 | 30 days supply | Qty: 60 | Fill #3

## 2016-11-04 ENCOUNTER — Ambulatory Visit (INDEPENDENT_AMBULATORY_CARE_PROVIDER_SITE_OTHER): Payer: 59 | Admitting: Podiatry

## 2016-11-04 ENCOUNTER — Encounter: Payer: Self-pay | Admitting: Podiatry

## 2016-11-04 DIAGNOSIS — M722 Plantar fascial fibromatosis: Secondary | ICD-10-CM | POA: Diagnosis not present

## 2016-11-04 MED ORDER — TRIAMCINOLONE ACETONIDE 10 MG/ML IJ SUSP
10.0000 mg | Freq: Once | INTRAMUSCULAR | Status: AC
  Administered 2016-11-04: 10 mg

## 2016-11-06 NOTE — Progress Notes (Signed)
Subjective:    Patient ID: [name] [name], male   DOB: 51 y.o.   MRN: [id number]   HPI patient states had a flareup of his plantar fasciitis and states it's not as bad as in the past but it still is bothersome for him    ROS      Objective:  Physical Exam Neurovascular status intact with patient found to have irritation and pain of the plantar fascial bilateral of a moderate nature    Assessment:   Acute plantar fasciitis bilateral with inflammation      Plan:    Discussed again that one point in future this may require surgery but pain is not as bad as previous and today injected the plantar fascia 3 mg Kenalog 5 mg Xylocaine and dispensed night splint with instructions on usage

## 2016-11-30 DIAGNOSIS — F4322 Adjustment disorder with anxiety: Secondary | ICD-10-CM | POA: Diagnosis not present

## 2016-12-06 DIAGNOSIS — F4322 Adjustment disorder with anxiety: Secondary | ICD-10-CM | POA: Diagnosis not present

## 2016-12-09 DIAGNOSIS — F411 Generalized anxiety disorder: Secondary | ICD-10-CM | POA: Diagnosis not present

## 2016-12-13 DIAGNOSIS — F4322 Adjustment disorder with anxiety: Secondary | ICD-10-CM | POA: Diagnosis not present

## 2016-12-13 MED FILL — clonazePAM 1 MG TABS: 1 | 30 days supply | Qty: 60 | Fill #4

## 2016-12-20 DIAGNOSIS — F4322 Adjustment disorder with anxiety: Secondary | ICD-10-CM | POA: Diagnosis not present

## 2017-01-06 DIAGNOSIS — F4322 Adjustment disorder with anxiety: Secondary | ICD-10-CM | POA: Diagnosis not present

## 2017-01-11 MED FILL — clonazePAM 1 MG TABS: 1 | 30 days supply | Qty: 60 | Fill #5

## 2017-02-10 DIAGNOSIS — F4322 Adjustment disorder with anxiety: Secondary | ICD-10-CM | POA: Diagnosis not present

## 2017-02-14 MED FILL — clonazePAM 1 MG TABS: 1 | 30 days supply | Qty: 60 | Fill #0

## 2017-02-17 MED FILL — AMOX TR-K CLV 875-125 MG TA: 875-125 | 10 days supply | Qty: 20 | Fill #0

## 2017-02-21 DIAGNOSIS — M25512 Pain in left shoulder: Secondary | ICD-10-CM | POA: Diagnosis not present

## 2017-02-22 ENCOUNTER — Telehealth: Payer: Self-pay | Admitting: Endocrinology

## 2017-02-22 DIAGNOSIS — R7989 Other specified abnormal findings of blood chemistry: Secondary | ICD-10-CM | POA: Insufficient documentation

## 2017-02-22 NOTE — Telephone Encounter (Signed)
Pt calls today.  Testosterone was recently low.  I ordered labs.  Plan will then will be for OV here. Pt agrees.

## 2017-02-23 ENCOUNTER — Other Ambulatory Visit (INDEPENDENT_AMBULATORY_CARE_PROVIDER_SITE_OTHER): Payer: 59

## 2017-02-23 DIAGNOSIS — R7989 Other specified abnormal findings of blood chemistry: Secondary | ICD-10-CM

## 2017-02-23 LAB — LUTEINIZING HORMONE: LH: 3.86 m[IU]/mL (ref 1.50–9.30)

## 2017-02-24 LAB — PROLACTIN: Prolactin: 7.2 ng/mL (ref 2.0–18.0)

## 2017-02-26 LAB — TESTOSTERONE,FREE AND TOTAL
Testosterone, Free: 6.5 pg/mL — ABNORMAL LOW (ref 6.8–21.5)
Testosterone: 245 ng/dL — ABNORMAL LOW (ref 264–916)

## 2017-03-01 ENCOUNTER — Encounter: Payer: Self-pay | Admitting: Endocrinology

## 2017-03-01 ENCOUNTER — Ambulatory Visit (INDEPENDENT_AMBULATORY_CARE_PROVIDER_SITE_OTHER): Payer: 59 | Admitting: Endocrinology

## 2017-03-01 VITALS — BP 118/72 | HR 66 | Wt 160.0 lb

## 2017-03-01 DIAGNOSIS — Z0000 Encounter for general adult medical examination without abnormal findings: Secondary | ICD-10-CM

## 2017-03-01 DIAGNOSIS — R7989 Other specified abnormal findings of blood chemistry: Secondary | ICD-10-CM | POA: Diagnosis not present

## 2017-03-01 DIAGNOSIS — Z125 Encounter for screening for malignant neoplasm of prostate: Secondary | ICD-10-CM | POA: Diagnosis not present

## 2017-03-01 MED ORDER — CLOMIPHENE CITRATE 50 MG PO TABS: ORAL_TABLET | ORAL | 5 refills | Status: DC

## 2017-03-01 NOTE — Progress Notes (Signed)
Subjective:    Patient ID: [name] [name], male    DOB: November 03, 1965, 51 y.o.   MRN: [id number]  HPI Pt is self-referred by for low testosterone.  Pt reports he had puberty at the normal age.  He has 1 biological child.  He says he has never taken illicit androgens.  He has never been on any prescribed medication for hypogonadism.  He does not take antiandrogens or opioids.  He denies any h/o infertility, XRT, or genital infection.  He has never had surgery, or a serious injury to the head or genital area. He has no h/o sleep apnea or DVT.   He does not consume alcohol excessively.  He has slightly decreased libido.  He was first noted to have low testosterone approx 3 years ago .   Past Medical History:  Diagnosis Date  . Allergy   . Anxiety   . Diverticulitis   . OCD (obsessive compulsive disorder)   . Tear of left glenoid labrum, posterior 05/13/2016    Past Surgical History:  Procedure Laterality Date  . COLONOSCOPY    . PARTIAL COLECTOMY  2009   for diverticulitis  . SHOULDER ARTHROSCOPY WITH BANKART REPAIR Left 05/13/2016   Procedure: SHOULDER ARTHROSCOPY WITH POSTERIOR BANKART REPAIR with extensive debridement, left bursectomy;  Surgeon: Marchia Bond, MD;  Location: [facility];  Service: Orthopedics;  Laterality: Left;  pre/Post Op Scalene block  . [place]ctor EXTRACTION  1988    Social History   Social History  . Marital status: Married    Spouse name: N/A  . Number of children: N/A  . Years of education: N/A   Occupational History  . Not on file.   Social History Main Topics  . Smoking status: Never Smoker  . Smokeless tobacco: Never Used  . Alcohol use 2.4 oz/week    4 Cans of beer per week  . Drug use: No  . Sexual activity: Not on file   Other Topics Concern  . Not on file   Social History Narrative  . No narrative on file    Current Outpatient Prescriptions on File Prior to Visit  Medication Sig Dispense Refill  . clonazePAM  (KLONOPIN) 1 MG tablet Take 1 mg by mouth at bedtime.     . Multiple Vitamin (MULTIVITAMIN) tablet Take 1 tablet by mouth daily.     No current facility-administered medications on file prior to visit.     Allergies  Allergen Reactions  . Dextromethorphan Hives and Swelling  . Hydrocodone-Acetaminophen Hives    Hydrocodone - not hydrocodone-acetaminophen    Family History  Problem Relation Age of Onset  . Colon cancer Neg Hx     BP 118/72 (BP Location: Left Arm, Patient Position: Sitting)   Pulse 66   Wt 160 lb (72.6 kg)   SpO2 97%   BMI 22.96 kg/m    Review of Systems denies depression, numbness, decreased urinary stream, gynecomastia, muscle weakness, fever, headache, easy bruising, sob, rash, blurry vision, rhinorrhea, chest pain.  He hs lost a few lbs, due to his efforts.      Objective:   Physical Exam VS: see vs page GEN: no distress NECK: supple, thyroid is not enlarged.  LUNGS: clear to auscultation.   BREASTS:  No gynecomastia.  CV: reg rate and rhythm, no murmur.  ABD: abdomen is soft, nontender.  no hepatosplenomegaly.  not distended.  no hernia.   GENITALIA:  Normal male.   MUSCULOSKELETAL: muscle bulk and strength are  grossly normal.  gait is normal and steady.   EXTEMITIES: no edema PULSES: no carotid bruit NEURO:  cn 2-12 grossly intact.   readily moves all 4's.  SKIN:  Normal texture and temperature.  Not diaphoretic.  Normal hair distribution.  NODES:  None palpable at the neck.  PSYCH: alert, well-oriented.  Does not appear anxious nor depressed.     Lab Results  Component Value Date   TESTOSTERONE 245 (L) 02/23/2017      Assessment & Plan:  Low testosterone, central, new, mild, uncertain etiology  Patient Instructions  I have sent a prescription to your pharmacy, for the clomiphene.   Please come back in 1 month to redo the blood tests.

## 2017-03-01 NOTE — Patient Instructions (Signed)
I have sent a prescription to your pharmacy, for the clomiphene.   Please come back in 1 month to redo the blood tests.

## 2017-03-10 MED FILL — CLOMIPHENE CITRATE 50 MG TA: 50 | 90 days supply | Qty: 10 | Fill #0

## 2017-03-16 DIAGNOSIS — M25512 Pain in left shoulder: Secondary | ICD-10-CM | POA: Diagnosis not present

## 2017-03-16 MED FILL — clonazePAM 1 MG TABS: 1 | 30 days supply | Qty: 60 | Fill #0

## 2017-03-21 DIAGNOSIS — M542 Cervicalgia: Secondary | ICD-10-CM | POA: Diagnosis not present

## 2017-03-21 DIAGNOSIS — M25512 Pain in left shoulder: Secondary | ICD-10-CM | POA: Diagnosis not present

## 2017-03-29 DIAGNOSIS — F411 Generalized anxiety disorder: Secondary | ICD-10-CM | POA: Diagnosis not present

## 2017-04-26 MED FILL — clonazePAM 1 MG TABS: 1 | 30 days supply | Qty: 60 | Fill #1

## 2017-05-23 DIAGNOSIS — M25512 Pain in left shoulder: Secondary | ICD-10-CM | POA: Diagnosis not present

## 2017-05-24 MED FILL — clonazePAM 1 MG TABS: 1 | 30 days supply | Qty: 60 | Fill #2

## 2017-05-26 ENCOUNTER — Ambulatory Visit: Payer: 59 | Attending: Specialist | Admitting: Physical Therapy

## 2017-05-26 DIAGNOSIS — M25612 Stiffness of left shoulder, not elsewhere classified: Secondary | ICD-10-CM | POA: Insufficient documentation

## 2017-05-26 DIAGNOSIS — M25512 Pain in left shoulder: Secondary | ICD-10-CM | POA: Diagnosis not present

## 2017-05-26 DIAGNOSIS — R600 Localized edema: Secondary | ICD-10-CM | POA: Insufficient documentation

## 2017-05-26 DIAGNOSIS — M6281 Muscle weakness (generalized): Secondary | ICD-10-CM | POA: Diagnosis not present

## 2017-05-26 NOTE — Therapy (Signed)
[place], [name], [address] Phone: [phone]   Fax:  [phone]  Physical Therapy Evaluation  Patient Details  Name: [name] [name] MRN: [id number] Date of Birth: Oct 13, 1965 Referring Provider: Dr Carter Kitten    Encounter Date: 05/26/2017  PT End of Session - 05/26/17 0858    Visit Number  1    Number of Visits  16    Date for PT Re-Evaluation  07/21/17    Authorization Type  [facility] Save     PT Start Time  [phone]    PT Stop Time  0930    PT Time Calculation (min)  41 min    Activity Tolerance  Patient tolerated treatment well    Behavior During Therapy  [facility] for tasks assessed/performed       Past Medical History:  Diagnosis Date  . Allergy   . Anxiety   . Diverticulitis   . OCD (obsessive compulsive disorder)   . Tear of left glenoid labrum, posterior 05/13/2016    Past Surgical History:  Procedure Laterality Date  . COLONOSCOPY    . PARTIAL COLECTOMY  2009   for diverticulitis  . SHOULDER ARTHROSCOPY WITH BANKART REPAIR Left 05/13/2016   Procedure: SHOULDER ARTHROSCOPY WITH POSTERIOR BANKART REPAIR with extensive debridement, left bursectomy;  Surgeon: Marchia Bond, MD;  Location: [facility];  Service: Orthopedics;  Laterality: Left;  pre/Post Op Scalene block  . WISDOM TOOTH EXTRACTION  1988    There were no vitals filed for this visit.       [facility] PT Assessment - 05/26/17 0001      Assessment   Medical Diagnosis  Left Shoulder impingement     Referring Provider  Dr Carter Kitten     Next MD Visit  None Scheduled     Prior Therapy  Feburary 2018       Precautions   Precautions  None      Restrictions   Weight Bearing Restrictions  No      Balance Screen   Has the patient fallen in the past 6 months  No    Has the patient had a decrease in activity level because of a fear of falling?   No    Is the patient reluctant to leave their home because of a fear  of falling?   No      Prior Function   Level of Independence  Independent    Vocation  Full time employment    Vocation Requirements  MD at [employer]       Cognition   Overall Cognitive Status  Within Functional Limits for tasks assessed    Attention  Focused    Focused Attention  Appears intact    Memory  Appears intact    Awareness  Appears intact    Problem Solving  Appears intact      Observation/Other Assessments   Observations  Sits with rounded shoulders and forward hear; Left shoulder more rounded then right       Sensation   Light Touch  Appears Intact    Additional Comments  Denies parathesias       Coordination   Gross Motor Movements are Fluid and Coordinated  Yes    Fine Motor Movements are Fluid and Coordinated  Yes      Posture/Postural Control   Posture/Postural Control  No significant limitations  ROM / Strength   AROM / PROM / Strength  AROM;PROM;Strength      AROM   Overall AROM Comments  full active right shoulder ROM     AROM Assessment Site  Shoulder    Right/Left Shoulder  Right;Left    Left Shoulder Flexion  154 Degrees    Left Shoulder Internal Rotation  -- Pain reaching behind his back     Left Shoulder External Rotation  -- mild pain reaching behind his head     Left Shoulder Horizontal ADduction  -- pain with horiztal adduction       PROM   Overall PROM Comments  Pain and tightness with end range flexion and external rotation     Right/Left Shoulder  Left    Left Shoulder Flexion  160 Degrees with pain and tightness     Left Shoulder Internal Rotation  70 Degrees with pain and tightness     Left Shoulder External Rotation  60 Degrees with pain at end range       Strength   Strength Assessment Site  Shoulder    Right/Left Shoulder  Left    Left Shoulder Flexion  4/5    Left Shoulder Internal Rotation  4+/5    Left Shoulder External Rotation  4/5      Palpation   Palpation comment  Tender to palation in  the anterior shoulder       Special Tests    Special Tests  Rotator Cuff Impingement;Laxity/Instability Tests;Biceps/Labral Tests    Rotator Cuff Impingment tests  Michel Bickers test;Empty Can test;Drop Arm test    Laxity/Instability   Load and shift test    Biceps/Labral tests  Other aprehension test (-)       Hawkins-Kennedy test   Comments  (+) left       Empty Can test   Comment  (+) left       Drop Arm test   Comment  (-)       Load and Shift test    Comment  (-) left              Objective measurements completed on examination: See above findings.      [facility] PT Treatment/Exercise - 05/26/17 0001      Shoulder Exercises: Standing   Extension Limitations  2x10 green     Row Limitations  2x10 green     Other Standing Exercises  shoulder ER red 2x10; Shoulder IR red 2x10       Shoulder Exercises: Stretch   Internal Rotation Stretch Limitations  sleepr stretch 3x15 second hold       Manual Therapy   Manual therapy comments  PROM flexion, ER, IR; Gentle grade II and III PA mobs for IR              PT Education - 05/26/17 1624    Education provided  Yes    Education Details  reviewed HEP; reviewed symptom management     Person(s) Educated  Patient    Methods  Explanation;Demonstration;Tactile cues;Verbal cues    Comprehension  Verbalized understanding;Returned demonstration;Verbal cues required;Tactile cues required       PT Short Term Goals - 05/26/17 1635      PT SHORT TERM GOAL #1   Title  Patient will deonstrate 5/5 gross left shoulder strength     Time  4    Period  Weeks    Status  New      PT SHORT TERM  GOAL #2   Title  Patient will be indepdnent with basic strengthening program     Time  4    Period  Weeks    Status  New      PT SHORT TERM GOAL #3   Title  Patient will demsotrate full passive ROM     Time  4    Period  Weeks    Status  New        PT Long Term Goals - 05/26/17 1637      PT LONG TERM GOAL #1   Title   Patient will reach behind back to t-8 without pain in order to perfrom ADL's     Time  8    Period  Weeks    Status  New      PT LONG TERM GOAL #2   Title  Patient will reach overhead into a cabinet without pain in order to perfrom IADL's     Baseline  can reach overhead with pain    Time  8    Period  Weeks    Status  New      PT LONG TERM GOAL #3   Title  Patient will sleep through the night without pain on the left side     Baseline  recent difficulty sleeping    Time  8    Period  Weeks    Status  New      PT LONG TERM GOAL #4   Title  Patient will demsotrate a 24% limitation on FOTO     Time  8    Period  Weeks    Status  New      PT LONG TERM GOAL #5   Title  Patient will be independent with advanced exercise program.     Time  8    Period  Weeks    Status  New             Plan - 05/26/17 1627    Clinical Impression Statement  Patient is a 51 year old male S/P left labral repair on 05/13/2017. He went through a course of physical therapy and had improvement but since then has had increased pain in his shoulder. Singsn and symptoms are consitent with shoulder impingement. He has been doing exercises at home but some of them may be makinghis impingement worse such as pushups and bench press. He would benefit from skilled therapy to work on postural correction with eventual progression to a gym program.       History and Personal Factors relevant to plan of care:  Anxiety, OCD     Clinical Presentation  Evolving    Clinical Presentation due to:  increasing pain with time and activity     Clinical Decision Making  Low    Rehab Potential  Good    PT Frequency  2x / week    PT Duration  8 weeks    PT Treatment/Interventions  ADLs/Self Care Home Management;Cryotherapy;Electrical Stimulation;Iontophoresis 4mg /ml Dexamethasone;Traction;Ultrasound;Therapeutic exercise;Therapeutic activities;Neuromuscular re-education;Patient/family education;Taping;Vasopneumatic Device     PT Next Visit Plan  Mnaul therapy to reduce impingement; Scapualr stability exercise; Consider quadruped alternating UE/LE; review ER/IR; add prone series; consdier scapualr clock; mulligan IR stretch      PT Home Exercise Plan  shoulder extension 2x10 green; Scap retraction; shoulder IR/ ER; side lying IR sleeper stretch     Consulted and Agree with Plan of Care  Patient       Patient will benefit  from skilled therapeutic intervention in order to improve the following deficits and impairments:  Pain, Postural dysfunction, Decreased strength, Decreased mobility, Decreased activity tolerance, Impaired UE functional use, Decreased range of motion  Visit Diagnosis: Stiffness of left shoulder, not elsewhere classified - Plan: PT plan of care cert/re-cert  Acute pain of left shoulder - Plan: PT plan of care cert/re-cert  Muscle weakness (generalized) - Plan: PT plan of care cert/re-cert  Localized edema - Plan: PT plan of care cert/re-cert     Problem List Patient Active Problem List   Diagnosis Date Noted  . Wellness examination 03/01/2017  . Low testosterone 02/22/2017  . Tear of left glenoid labrum, posterior 05/13/2016  . Fever 01/20/2011  . PLANTAR FASCIITIS, LEFT 05/04/2010  . HEEL PAIN 05/04/2010  . HALLUX RIGIDUS 05/04/2010    Carney Living PT DPT  05/26/2017, 4:44 PM  [facility] [address] [place], [name], [address] Phone: [phone]   Fax:  [phone]  Name: [name] [name] MRN: [id number] Date of Birth: [birth year]

## 2017-05-30 ENCOUNTER — Other Ambulatory Visit (INDEPENDENT_AMBULATORY_CARE_PROVIDER_SITE_OTHER): Payer: 59

## 2017-05-30 ENCOUNTER — Other Ambulatory Visit: Payer: Self-pay | Admitting: Endocrinology

## 2017-05-30 DIAGNOSIS — Z0000 Encounter for general adult medical examination without abnormal findings: Secondary | ICD-10-CM | POA: Diagnosis not present

## 2017-05-30 DIAGNOSIS — R7989 Other specified abnormal findings of blood chemistry: Secondary | ICD-10-CM | POA: Diagnosis not present

## 2017-05-30 LAB — T4, FREE: Free T4: 0.85 ng/dL (ref 0.60–1.60)

## 2017-05-30 LAB — IBC PANEL
IRON: 157 ug/dL (ref 42–165)
Saturation Ratios: 51.9 % — ABNORMAL HIGH (ref 20.0–50.0)
TRANSFERRIN: 216 mg/dL (ref 212.0–360.0)

## 2017-05-30 LAB — PSA: PSA: 0.7 ng/mL (ref 0.10–4.00)

## 2017-05-30 LAB — TSH: TSH: 2.26 u[IU]/mL (ref 0.35–4.50)

## 2017-05-31 ENCOUNTER — Other Ambulatory Visit: Payer: Self-pay | Admitting: Endocrinology

## 2017-05-31 ENCOUNTER — Encounter: Payer: Self-pay | Admitting: Endocrinology

## 2017-05-31 DIAGNOSIS — H5203 Hypermetropia, bilateral: Secondary | ICD-10-CM | POA: Diagnosis not present

## 2017-05-31 DIAGNOSIS — H524 Presbyopia: Secondary | ICD-10-CM | POA: Diagnosis not present

## 2017-05-31 DIAGNOSIS — H52223 Regular astigmatism, bilateral: Secondary | ICD-10-CM | POA: Diagnosis not present

## 2017-05-31 LAB — TESTOSTERONE,FREE AND TOTAL
TESTOSTERONE: 428 ng/dL (ref 264–916)
Testosterone, Free: 10.8 pg/mL (ref 6.8–21.5)

## 2017-06-03 MED FILL — CLOMIPHENE CITRATE 50 MG TA: 50 | 90 days supply | Qty: 10 | Fill #1

## 2017-06-07 ENCOUNTER — Ambulatory Visit: Payer: 59 | Admitting: Physical Therapy

## 2017-06-07 DIAGNOSIS — R600 Localized edema: Secondary | ICD-10-CM

## 2017-06-07 DIAGNOSIS — M6281 Muscle weakness (generalized): Secondary | ICD-10-CM

## 2017-06-07 DIAGNOSIS — M25612 Stiffness of left shoulder, not elsewhere classified: Secondary | ICD-10-CM | POA: Diagnosis not present

## 2017-06-07 DIAGNOSIS — M25512 Pain in left shoulder: Secondary | ICD-10-CM

## 2017-06-07 NOTE — Therapy (Signed)
[place], [name], [address] Phone: [phone]   Fax:  [phone]  Physical Therapy Treatment  Patient Details  Name: [name] [name] MRN: [id number] Date of Birth: [birth year] Referring Provider: Dr Carter Kitten    Encounter Date: 06/07/2017  PT End of Session - 06/07/17 1527    Visit Number  2    Number of Visits  16    Date for PT Re-Evaluation  07/21/17    Authorization Type  [employer] Save     PT Start Time  0845    PT Stop Time  0938    PT Time Calculation (min)  53 min    Activity Tolerance  Patient tolerated treatment well    Behavior During Therapy  [facility] for tasks assessed/performed       Past Medical History:  Diagnosis Date  . Allergy   . Anxiety   . Diverticulitis   . OCD (obsessive compulsive disorder)   . Tear of left glenoid labrum, posterior 05/13/2016    Past Surgical History:  Procedure Laterality Date  . COLONOSCOPY    . PARTIAL COLECTOMY  2009   for diverticulitis  . SHOULDER ARTHROSCOPY WITH BANKART REPAIR Left 05/13/2016   Procedure: SHOULDER ARTHROSCOPY WITH POSTERIOR BANKART REPAIR with extensive debridement, left bursectomy;  Surgeon: Marchia Bond, MD;  Location: [place];  Service: Orthopedics;  Laterality: Left;  pre/Post Op Scalene block  . WISDOM TOOTH EXTRACTION  1988    There were no vitals filed for this visit.  Subjective Assessment - 06/07/17 0849    Subjective  Patient reports it has been a little reaiser to sleep on his shoulder. He is still having pain though. His pain increases with activity. His pain today is in his [facility] joint.     Currently in Pain?  Yes    Pain Score  3     Pain Location  Shoulder    Pain Orientation  Left    Pain Descriptors / Indicators  Aching    Pain Type  Chronic pain    Pain Onset  More than a month ago    Pain Frequency  Constant    Aggravating Factors   sleeping     Pain Relieving Factors  rest     Multiple Pain Sites  No                      [facility] Adult PT Treatment/Exercise - 06/07/17 0001      Shoulder Exercises: Prone   Other Prone Exercises  quadruped alt UE/LE x10; alt UE x10;    Other Prone Exercises  prone y 2x10; horizontal abduction 2x10       Shoulder Exercises: Standing   Other Standing Exercises  push up plus 2x10; wall push up 2x10;       Modalities   Modalities  Cryotherapy      Cryotherapy   Number Minutes Cryotherapy  10 Minutes    Cryotherapy Location  Shoulder    Type of Cryotherapy  Ice pack      Manual Therapy   Manual therapy comments  PROM flexion, ER, IR; Gentle grade II and III PA mobs for IR              PT Education - 06/07/17 1527    Education provided  Yes    Education Details  updated HEP; reviewed technique     Person(s) Educated  Patient  Methods  Explanation;Demonstration;Tactile cues;Verbal cues    Comprehension  Verbalized understanding;Returned demonstration;Verbal cues required;Tactile cues required       PT Short Term Goals - 05/26/17 1635      PT SHORT TERM GOAL #1   Title  Patient will deonstrate 5/5 gross left shoulder strength     Time  4    Period  Weeks    Status  New      PT SHORT TERM GOAL #2   Title  Patient will be indepdnent with basic strengthening program     Time  4    Period  Weeks    Status  New      PT SHORT TERM GOAL #3   Title  Patient will demsotrate full passive ROM     Time  4    Period  Weeks    Status  New        PT Long Term Goals - 05/26/17 1637      PT LONG TERM GOAL #1   Title  Patient will reach behind back to t-8 without pain in order to perfrom ADL's     Time  8    Period  Weeks    Status  New      PT LONG TERM GOAL #2   Title  Patient will reach overhead into a cabinet without pain in order to perfrom IADL's     Baseline  can reach overhead with pain    Time  8    Period  Weeks    Status  New      PT LONG TERM GOAL #3   Title  Patient will sleep  through the night without pain on the left side     Baseline  recent difficulty sleeping    Time  8    Period  Weeks    Status  New      PT LONG TERM GOAL #4   Title  Patient will demsotrate a 24% limitation on FOTO     Time  8    Period  Weeks    Status  New      PT LONG TERM GOAL #5   Title  Patient will be independent with advanced exercise program.     Time  8    Period  Weeks    Status  New            Plan - 06/07/17 1529    Clinical Impression Statement  Patient tolerated treatment well. He had full pain free pROM with stretching. He was given shoulder stabililization exercises. He had no increase in pain but he did have some mobility restrictions with active motion.     Clinical Presentation  Evolving    Clinical Decision Making  Low    Rehab Potential  Good    PT Frequency  2x / week    PT Duration  8 weeks    PT Treatment/Interventions  ADLs/Self Care Home Management;Cryotherapy;Electrical Stimulation;Iontophoresis 4mg /ml Dexamethasone;Traction;Ultrasound;Therapeutic exercise;Therapeutic activities;Neuromuscular re-education;Patient/family education;Taping;Vasopneumatic Device    PT Next Visit Plan  Mnaul therapy to reduce impingement; Scapualr stability exercise; Consider quadruped alternating UE/LE; review ER/IR; add prone series; consdier scapualr clock; mulligan IR stretch      PT Home Exercise Plan  shoulder extension 2x10 green; Scap retraction; shoulder IR/ ER; side lying IR sleeper stretch     Consulted and Agree with Plan of Care  Patient       Patient will benefit from skilled therapeutic intervention  in order to improve the following deficits and impairments:  Pain, Postural dysfunction, Decreased strength, Decreased mobility, Decreased activity tolerance, Impaired UE functional use, Decreased range of motion  Visit Diagnosis: Stiffness of left shoulder, not elsewhere classified  Acute pain of left shoulder  Muscle weakness (generalized)  Localized  edema     Problem List Patient Active Problem List   Diagnosis Date Noted  . Wellness examination 03/01/2017  . Low testosterone 02/22/2017  . Tear of left glenoid labrum, posterior 05/13/2016  . Fever 01/20/2011  . PLANTAR FASCIITIS, LEFT 05/04/2010  . HEEL PAIN 05/04/2010  . HALLUX RIGIDUS 05/04/2010    Carney Living  PT DPT  06/07/2017, 3:33 PM  [facility] [address] [place], [name], [address] Phone: [phone]   Fax:  [phone]  Name: [name] [name] MRN: [id number] Date of Birth: [birth year]

## 2017-06-09 ENCOUNTER — Ambulatory Visit: Payer: 59 | Admitting: Physical Therapy

## 2017-06-09 ENCOUNTER — Encounter: Payer: Self-pay | Admitting: Physical Therapy

## 2017-06-09 DIAGNOSIS — M25612 Stiffness of left shoulder, not elsewhere classified: Secondary | ICD-10-CM | POA: Diagnosis not present

## 2017-06-09 DIAGNOSIS — M25512 Pain in left shoulder: Secondary | ICD-10-CM | POA: Diagnosis not present

## 2017-06-09 DIAGNOSIS — M6281 Muscle weakness (generalized): Secondary | ICD-10-CM

## 2017-06-09 DIAGNOSIS — R600 Localized edema: Secondary | ICD-10-CM

## 2017-06-09 NOTE — Therapy (Signed)
[place], [name], [address] Phone: [phone]   Fax:  [phone]  Physical Therapy Treatment  Patient Details  Name: [name] [name] MRN: [id number] Date of Birth: Dec 09, 1965 Referring Provider: Dr Carter Kitten    Encounter Date: 06/09/2017  PT End of Session - 06/09/17 1541    Visit Number  3    Number of Visits  16    Date for PT Re-Evaluation  07/21/17    Authorization Type  [employer] Save     PT Start Time  1100    PT Stop Time  1140    PT Time Calculation (min)  40 min    Activity Tolerance  Patient tolerated treatment well    Behavior During Therapy  [facility] for tasks assessed/performed       Past Medical History:  Diagnosis Date  . Allergy   . Anxiety   . Diverticulitis   . OCD (obsessive compulsive disorder)   . Tear of left glenoid labrum, posterior 05/13/2016    Past Surgical History:  Procedure Laterality Date  . COLONOSCOPY    . PARTIAL COLECTOMY  2009   for diverticulitis  . SHOULDER ARTHROSCOPY WITH BANKART REPAIR Left 05/13/2016   Procedure: SHOULDER ARTHROSCOPY WITH POSTERIOR BANKART REPAIR with extensive debridement, left bursectomy;  Surgeon: Marchia Bond, MD;  Location: [place];  Service: Orthopedics;  Laterality: Left;  pre/Post Op Scalene block  . WISDOM TOOTH EXTRACTION  1988    There were no vitals filed for this visit.  Subjective Assessment - 06/09/17 1538    Subjective  Patient reports he was a little sore after doing his exercises yesterday. He feels like he may have rushed a bit. He s sleeping better on his shoulder but he continues to have pain.     Currently in Pain?  Yes    Pain Score  2     Pain Location  Shoulder    Pain Orientation  Left    Pain Descriptors / Indicators  Aching    Pain Type  Chronic pain    Pain Onset  More than a month ago    Pain Frequency  Constant    Aggravating Factors   sleeping     Pain Relieving Factors  rest      Multiple Pain Sites  No                      [facility] Adult PT Treatment/Exercise - 06/09/17 0001      Shoulder Exercises: Prone   Other Prone Exercises  quadruped alt UE/LE x10; alt UE x10;    Other Prone Exercises  prone y 2x10; horizontal abduction 2x10       Shoulder Exercises: Standing   Other Standing Exercises  wall clock yellow 5x each; wall walk 5x each direction 3 laps yellow       Shoulder Exercises: Power Warden/ranger Exercises  shoulder row both handles 25 2x10; lat pulldwon 2x10 25lbs       Modalities   Modalities  Cryotherapy      Cryotherapy   Cryotherapy Location  --      Manual Therapy   Manual therapy comments  PROM flexion, ER, IR; Gentle grade II and III PA mobs for IR              PT Education - 06/09/17 1541    Education provided  Yes  Education Details  updated HEP     Person(s) Educated  Patient    Methods  Explanation;Demonstration;Tactile cues;Verbal cues    Comprehension  Verbalized understanding;Returned demonstration;Verbal cues required;Tactile cues required       PT Short Term Goals - 05/26/17 1635      PT SHORT TERM GOAL #1   Title  Patient will deonstrate 5/5 gross left shoulder strength     Time  4    Period  Weeks    Status  New      PT SHORT TERM GOAL #2   Title  Patient will be indepdnent with basic strengthening program     Time  4    Period  Weeks    Status  New      PT SHORT TERM GOAL #3   Title  Patient will demsotrate full passive ROM     Time  4    Period  Weeks    Status  New        PT Long Term Goals - 05/26/17 1637      PT LONG TERM GOAL #1   Title  Patient will reach behind back to t-8 without pain in order to perfrom ADL's     Time  8    Period  Weeks    Status  New      PT LONG TERM GOAL #2   Title  Patient will reach overhead into a cabinet without pain in order to perfrom IADL's     Baseline  can reach overhead with pain    Time  8    Period  Weeks    Status   New      PT LONG TERM GOAL #3   Title  Patient will sleep through the night without pain on the left side     Baseline  recent difficulty sleeping    Time  8    Period  Weeks    Status  New      PT LONG TERM GOAL #4   Title  Patient will demsotrate a 24% limitation on FOTO     Time  8    Period  Weeks    Status  New      PT LONG TERM GOAL #5   Title  Patient will be independent with advanced exercise program.     Time  8    Period  Weeks    Status  New            Plan - 06/09/17 1543    Clinical Impression Statement  Therapy added higher level scapular exericses. he was sore after treatment. He was advised to continue icing if his shoulder is sore. Most of his pain is in his [facility] joint. His rnage has improved. He has slight limitations in IR and ER. He was advised to use a towel for ER/IR strengthening. Therapy will continue to progress as tolerated.     Clinical Presentation  Evolving    Clinical Decision Making  Low    Rehab Potential  Good    PT Frequency  2x / week    PT Duration  8 weeks    PT Treatment/Interventions  ADLs/Self Care Home Management;Cryotherapy;Electrical Stimulation;Iontophoresis 4mg /ml Dexamethasone;Traction;Ultrasound;Therapeutic exercise;Therapeutic activities;Neuromuscular re-education;Patient/family education;Taping;Vasopneumatic Device    PT Next Visit Plan  Mnaul therapy to reduce impingement; Scapualr stability exercise; Consider quadruped alternating UE/LE; review ER/IR; add prone series; consdier scapualr clock; mulligan IR stretch      PT Home Exercise  Plan  shoulder extension 2x10 green; Scap retraction; shoulder IR/ ER; side lying IR sleeper stretch     Consulted and Agree with Plan of Care  Patient       Patient will benefit from skilled therapeutic intervention in order to improve the following deficits and impairments:  Pain, Postural dysfunction, Decreased strength, Decreased mobility, Decreased activity tolerance, Impaired UE functional  use, Decreased range of motion  Visit Diagnosis: Stiffness of left shoulder, not elsewhere classified  Acute pain of left shoulder  Muscle weakness (generalized)  Localized edema     Problem List Patient Active Problem List   Diagnosis Date Noted  . Wellness examination 03/01/2017  . Low testosterone 02/22/2017  . Tear of left glenoid labrum, posterior 05/13/2016  . Fever 01/20/2011  . PLANTAR FASCIITIS, LEFT 05/04/2010  . HEEL PAIN 05/04/2010  . HALLUX RIGIDUS 05/04/2010    Carney Living PT DPT  06/09/2017, 3:49 PM  [facility] [address] [place], [name], [address] Phone: [phone]   Fax:  [phone]  Name: Raven Harmes MRN: [id number] Date of Birth: 09-10-1965

## 2017-06-14 ENCOUNTER — Ambulatory Visit: Payer: 59 | Attending: Specialist | Admitting: Physical Therapy

## 2017-06-14 DIAGNOSIS — R600 Localized edema: Secondary | ICD-10-CM | POA: Diagnosis not present

## 2017-06-14 DIAGNOSIS — M25612 Stiffness of left shoulder, not elsewhere classified: Secondary | ICD-10-CM | POA: Insufficient documentation

## 2017-06-14 DIAGNOSIS — M25512 Pain in left shoulder: Secondary | ICD-10-CM | POA: Insufficient documentation

## 2017-06-14 DIAGNOSIS — M6281 Muscle weakness (generalized): Secondary | ICD-10-CM | POA: Insufficient documentation

## 2017-06-14 NOTE — Therapy (Signed)
[place], [name], [address] Phone: [phone]   Fax:  [phone]  Physical Therapy Treatment  Patient Details  Name: [name] [name] MRN: [id number] Date of Birth: 08/18/1965 Referring Provider: Dr Carter Kitten    Encounter Date: 06/14/2017  PT End of Session - 06/14/17 2123    Visit Number  4    Number of Visits  16    Date for PT Re-Evaluation  07/21/17    Authorization Type  [employer] Save     PT Start Time  1101    PT Stop Time  1150    PT Time Calculation (min)  49 min       Past Medical History:  Diagnosis Date  . Allergy   . Anxiety   . Diverticulitis   . OCD (obsessive compulsive disorder)   . Tear of left glenoid labrum, posterior 05/13/2016    Past Surgical History:  Procedure Laterality Date  . COLONOSCOPY    . PARTIAL COLECTOMY  2009   for diverticulitis  . SHOULDER ARTHROSCOPY WITH BANKART REPAIR Left 05/13/2016   Procedure: SHOULDER ARTHROSCOPY WITH POSTERIOR BANKART REPAIR with extensive debridement, left bursectomy;  Surgeon: Marchia Bond, MD;  Location: [place];  Service: Orthopedics;  Laterality: Left;  pre/Post Op Scalene block  . WISDOM TOOTH EXTRACTION  1988    There were no vitals filed for this visit.  Subjective Assessment - 06/14/17 1106    Subjective  Patient reports it is sore onand off. He is still having some trouble sleeping. he feels like the sleeper is making him a little sore and the prone Y. He has been doing his exercises regularly.     Currently in Pain?  Yes    Pain Score  3     Pain Location  Shoulder    Pain Orientation  Left;Right    Pain Descriptors / Indicators  Aching    Pain Type  Chronic pain    Pain Onset  More than a month ago    Pain Frequency  Constant    Aggravating Factors   sleeping     Pain Relieving Factors  rest    Multiple Pain Sites  No                      [facility] Adult PT Treatment/Exercise -  06/14/17 0001      Shoulder Exercises: Prone   Other Prone Exercises  quadruped alt UE/LE x10; alt UE x10; plak 2x30 sec; high plank 2x10;     Other Prone Exercises  prone y 2x10; horizontal abduction 2x10       Modalities   Modalities  Cryotherapy      Cryotherapy   Number Minutes Cryotherapy  10 Minutes    Cryotherapy Location  Shoulder    Type of Cryotherapy  Ice pack      Manual Therapy   Manual therapy comments  PROM flexion, ER, IR; Gentle grade II and III PA mobs for IR              PT Education - 06/14/17 1159    Education provided  Yes    Education Details  updated HEP     Person(s) Educated  Patient    Methods  Explanation;Demonstration;Tactile cues;Verbal cues    Comprehension  Verbalized understanding;Returned demonstration;Verbal cues required;Tactile cues required       PT Short Term Goals - 06/14/17 2125  PT SHORT TERM GOAL #1   Title  Patient will deonstrate 5/5 gross left shoulder strength     Baseline  55 degrees     Time  4    Period  Weeks    Status  On-going      PT SHORT TERM GOAL #2   Title  Patient will be indepdnent with basic strengthening program     Baseline  4/10-7/10    Time  4    Period  Weeks    Status  On-going      PT SHORT TERM GOAL #3   Title  Patient will demsotrate full passive ROM     Baseline  135    Time  4    Period  Weeks    Status  On-going      PT SHORT TERM GOAL #4   Title  Patient will be independent with initial HEP     Baseline  independent    Time  4    Period  Weeks    Status  On-going      PT SHORT TERM GOAL #5   Title  Patient will demsotrate 4/5 gross left shoulder strength in available range     Baseline  not tested     Time  4    Period  Weeks    Status  On-going        PT Long Term Goals - 05/26/17 1637      PT LONG TERM GOAL #1   Title  Patient will reach behind back to t-8 without pain in order to perfrom ADL's     Time  8    Period  Weeks    Status  New      PT LONG TERM  GOAL #2   Title  Patient will reach overhead into a cabinet without pain in order to perfrom IADL's     Baseline  can reach overhead with pain    Time  8    Period  Weeks    Status  New      PT LONG TERM GOAL #3   Title  Patient will sleep through the night without pain on the left side     Baseline  recent difficulty sleeping    Time  8    Period  Weeks    Status  New      PT LONG TERM GOAL #4   Title  Patient will demsotrate a 24% limitation on FOTO     Time  8    Period  Weeks    Status  New      PT LONG TERM GOAL #5   Title  Patient will be independent with advanced exercise program.     Time  8    Period  Weeks    Status  New            Plan - 06/14/17 1159    Clinical Impression Statement  Patient had no increase in pain with the treatment. Therapy focused on weight bearing exercises to build stability. He hasd no increase in pain. His range is apporaching pain free end range.     Rehab Potential  Good    PT Frequency  2x / week    PT Duration  8 weeks    PT Treatment/Interventions  ADLs/Self Care Home Management;Cryotherapy;Electrical Stimulation;Iontophoresis 4mg /ml Dexamethasone;Traction;Ultrasound;Therapeutic exercise;Therapeutic activities;Neuromuscular re-education;Patient/family education;Taping;Vasopneumatic Device    PT Next Visit Plan  Mnaul therapy to reduce impingement; Scapualr stability  exercise; Consider quadruped alternating UE/LE; review ER/IR; add prone series; consdier scapualr clock; mulligan IR stretch      PT Home Exercise Plan  shoulder extension 2x10 green; Scap retraction; shoulder IR/ ER; side lying IR sleeper stretch     Consulted and Agree with Plan of Care  Patient       Patient will benefit from skilled therapeutic intervention in order to improve the following deficits and impairments:  Pain, Postural dysfunction, Decreased strength, Decreased mobility, Decreased activity tolerance, Impaired UE functional use, Decreased range of  motion  Visit Diagnosis: Stiffness of left shoulder, not elsewhere classified  Acute pain of left shoulder  Muscle weakness (generalized)  Localized edema     Problem List Patient Active Problem List   Diagnosis Date Noted  . Wellness examination 03/01/2017  . Low testosterone 02/22/2017  . Tear of left glenoid labrum, posterior 05/13/2016  . Fever 01/20/2011  . PLANTAR FASCIITIS, LEFT 05/04/2010  . HEEL PAIN 05/04/2010  . HALLUX RIGIDUS 05/04/2010    Carney Living PT DPT  06/14/2017, 9:28 PM  [facility] [address] [place], [name], [address] Phone: [phone]   Fax:  [phone]  Name: [name] [name] MRN: [id number] Date of Birth: [birth year]

## 2017-06-17 ENCOUNTER — Ambulatory Visit: Payer: 59 | Admitting: Physical Therapy

## 2017-06-17 DIAGNOSIS — R600 Localized edema: Secondary | ICD-10-CM

## 2017-06-17 DIAGNOSIS — M25612 Stiffness of left shoulder, not elsewhere classified: Secondary | ICD-10-CM | POA: Diagnosis not present

## 2017-06-17 DIAGNOSIS — M6281 Muscle weakness (generalized): Secondary | ICD-10-CM | POA: Diagnosis not present

## 2017-06-17 DIAGNOSIS — M25512 Pain in left shoulder: Secondary | ICD-10-CM | POA: Diagnosis not present

## 2017-06-17 NOTE — Therapy (Signed)
[place], [name], [address] Phone: [phone]   Fax:  [phone]  Physical Therapy Treatment  Patient Details  Name: [name] [name] MRN: [id number] Date of Birth: 1965-09-24 Referring Provider: Dr Carter Kitten    Encounter Date: 06/17/2017  PT End of Session - 06/17/17 1129    Visit Number  5    Number of Visits  16    Date for PT Re-Evaluation  07/21/17    Authorization Type  [employer] Save     PT Start Time  1104    PT Stop Time  1150    PT Time Calculation (min)  46 min    Activity Tolerance  Patient tolerated treatment well    Behavior During Therapy  [facility] for tasks assessed/performed       Past Medical History:  Diagnosis Date  . Allergy   . Anxiety   . Diverticulitis   . OCD (obsessive compulsive disorder)   . Tear of left glenoid labrum, posterior 05/13/2016    Past Surgical History:  Procedure Laterality Date  . COLONOSCOPY    . PARTIAL COLECTOMY  2009   for diverticulitis  . SHOULDER ARTHROSCOPY WITH BANKART REPAIR Left 05/13/2016   Procedure: SHOULDER ARTHROSCOPY WITH POSTERIOR BANKART REPAIR with extensive debridement, left bursectomy;  Surgeon: Marchia Bond, MD;  Location: [place];  Service: Orthopedics;  Laterality: Left;  pre/Post Op Scalene block  . WISDOM TOOTH EXTRACTION  1988    There were no vitals filed for this visit.  Subjective Assessment - 06/17/17 1109    Subjective  Patient reports he was a little sore after the last visit but it did not last very long.      Currently in Pain?  No/denies    Pain Location  Shoulder    Pain Orientation  Right;Left    Pain Descriptors / Indicators  Aching    Pain Type  Chronic pain    Pain Onset  More than a month ago    Pain Frequency  Constant    Aggravating Factors   sleeping     Pain Relieving Factors  rest     Multiple Pain Sites  No                      [facility] Adult PT  Treatment/Exercise - 06/17/17 0001      Shoulder Exercises: Supine   Other Supine Exercises  supine d2 flexion 4lb supine flexion mid range 2x10 2lb       Shoulder Exercises: Prone   Other Prone Exercises  prone y 2x10 2lb ; horizontal abduction 2x10 2lb       Shoulder Exercises: Sidelying   Other Sidelying Exercises  side lying ER 2x10 4lb       Shoulder Exercises: Standing   Other Standing Exercises  wall clock yellow 5x each; wall walk 5x each direction 3 laps yellow       Shoulder Exercises: Power Warden/ranger Exercises  shoulder row both handles 45 2x10; lat pulldwon 2x10 45lbs       Manual Therapy   Manual therapy comments  PROM flexion, ER, IR; Gentle grade II and III PA mobs for IR              PT Education - 06/17/17 1111    Education provided  Yes    Education Details  reviewed HEP and technique    Person(s) Educated  Patient    Methods  Demonstration    Comprehension  Verbalized understanding;Returned demonstration;Verbal cues required;Tactile cues required       PT Short Term Goals - 06/14/17 2125      PT SHORT TERM GOAL #1   Title  Patient will deonstrate 5/5 gross left shoulder strength     Baseline  55 degrees     Time  4    Period  Weeks    Status  On-going      PT SHORT TERM GOAL #2   Title  Patient will be indepdnent with basic strengthening program     Baseline  4/10-7/10    Time  4    Period  Weeks    Status  On-going      PT SHORT TERM GOAL #3   Title  Patient will demsotrate full passive ROM     Baseline  135    Time  4    Period  Weeks    Status  On-going      PT SHORT TERM GOAL #4   Title  Patient will be independent with initial HEP     Baseline  independent    Time  4    Period  Weeks    Status  On-going      PT SHORT TERM GOAL #5   Title  Patient will demsotrate 4/5 gross left shoulder strength in available range     Baseline  not tested     Time  4    Period  Weeks    Status  On-going        PT Long  Term Goals - 05/26/17 1637      PT LONG TERM GOAL #1   Title  Patient will reach behind back to t-8 without pain in order to perfrom ADL's     Time  8    Period  Weeks    Status  New      PT LONG TERM GOAL #2   Title  Patient will reach overhead into a cabinet without pain in order to perfrom IADL's     Baseline  can reach overhead with pain    Time  8    Period  Weeks    Status  New      PT LONG TERM GOAL #3   Title  Patient will sleep through the night without pain on the left side     Baseline  recent difficulty sleeping    Time  8    Period  Weeks    Status  New      PT LONG TERM GOAL #4   Title  Patient will demsotrate a 24% limitation on FOTO     Time  8    Period  Weeks    Status  New      PT LONG TERM GOAL #5   Title  Patient will be independent with advanced exercise program.     Time  8    Period  Weeks    Status  New            Plan - 06/17/17 1139    Clinical Impression Statement  Therapy was able to advance the patients weight with exercises. He had no significant increase in pain. he had full pain free PROM. Therapy will keep advancing exercises as tolerated.     Clinical Presentation  Evolving    Clinical Decision Making  Low    Rehab Potential  Good  PT Frequency  2x / week    PT Duration  8 weeks    PT Treatment/Interventions  ADLs/Self Care Home Management;Cryotherapy;Electrical Stimulation;Iontophoresis 4mg /ml Dexamethasone;Traction;Ultrasound;Therapeutic exercise;Therapeutic activities;Neuromuscular re-education;Patient/family education;Taping;Vasopneumatic Device    PT Next Visit Plan  Mnaul therapy to reduce impingement; Scapualr stability exercise; Consider quadruped alternating UE/LE; review ER/IR; add prone series; consdier scapualr clock; mulligan IR stretch      PT Home Exercise Plan  shoulder extension 2x10 green; Scap retraction; shoulder IR/ ER; side lying IR sleeper stretch; row machine; pull down machine; scapular clock      Consulted and Agree with Plan of Care  Patient       Patient will benefit from skilled therapeutic intervention in order to improve the following deficits and impairments:  Pain, Postural dysfunction, Decreased strength, Decreased mobility, Decreased activity tolerance, Impaired UE functional use, Decreased range of motion  Visit Diagnosis: Stiffness of left shoulder, not elsewhere classified  Acute pain of left shoulder  Muscle weakness (generalized)  Localized edema     Problem List Patient Active Problem List   Diagnosis Date Noted  . Wellness examination 03/01/2017  . Low testosterone 02/22/2017  . Tear of left glenoid labrum, posterior 05/13/2016  . Fever 01/20/2011  . PLANTAR FASCIITIS, LEFT 05/04/2010  . HEEL PAIN 05/04/2010  . HALLUX RIGIDUS 05/04/2010    Carney Living  PT DPT  06/17/2017, 12:00 PM  [facility] [address] [place], [name], [address] Phone: [phone]   Fax:  [phone]  Name: Kwane Rohl MRN: [id number] Date of Birth: 04-25-1966

## 2017-06-22 MED FILL — clonazePAM 1 MG TABS: 1 | 30 days supply | Qty: 60 | Fill #3

## 2017-06-23 ENCOUNTER — Encounter: Payer: 59 | Admitting: Physical Therapy

## 2017-06-24 ENCOUNTER — Encounter: Payer: Self-pay | Admitting: Physical Therapy

## 2017-06-24 ENCOUNTER — Ambulatory Visit: Payer: 59 | Admitting: Physical Therapy

## 2017-06-24 DIAGNOSIS — R600 Localized edema: Secondary | ICD-10-CM | POA: Diagnosis not present

## 2017-06-24 DIAGNOSIS — M25512 Pain in left shoulder: Secondary | ICD-10-CM | POA: Diagnosis not present

## 2017-06-24 DIAGNOSIS — M25612 Stiffness of left shoulder, not elsewhere classified: Secondary | ICD-10-CM

## 2017-06-24 DIAGNOSIS — M6281 Muscle weakness (generalized): Secondary | ICD-10-CM

## 2017-06-24 NOTE — Therapy (Signed)
[place], [name], [address] Phone: [phone]   Fax:  [phone]  Physical Therapy Treatment  Patient Details  Name: [name] [name] MRN: [id number] Date of Birth: [birth year] Referring Provider: Dr Carter Kitten    Encounter Date: 06/24/2017  PT End of Session - 06/24/17 1150    Visit Number  6    Number of Visits  16    Date for PT Re-Evaluation  07/21/17    Authorization Type  [employer] Save     PT Start Time  1146    PT Stop Time  1228    PT Time Calculation (min)  42 min    Activity Tolerance  Patient tolerated treatment well    Behavior During Therapy  [facility] for tasks assessed/performed       Past Medical History:  Diagnosis Date  . Allergy   . Anxiety   . Diverticulitis   . OCD (obsessive compulsive disorder)   . Tear of left glenoid labrum, posterior 05/13/2016    Past Surgical History:  Procedure Laterality Date  . COLONOSCOPY    . PARTIAL COLECTOMY  2009   for diverticulitis  . SHOULDER ARTHROSCOPY WITH BANKART REPAIR Left 05/13/2016   Procedure: SHOULDER ARTHROSCOPY WITH POSTERIOR BANKART REPAIR with extensive debridement, left bursectomy;  Surgeon: Marchia Bond, MD;  Location: [place];  Service: Orthopedics;  Laterality: Left;  pre/Post Op Scalene block  . WISDOM TOOTH EXTRACTION  1988    There were no vitals filed for this visit.  Subjective Assessment - 06/24/17 1246    Subjective  Patient reports no significant pain after the last treatment. He reports the pain is getting better but it is still there.     Currently in Pain?  No/denies                      [facility] Adult PT Treatment/Exercise - 06/24/17 0001      Shoulder Exercises: Supine   Other Supine Exercises  supine d2 flexion 4lb supine flexion mid range 2x10 2lb       Shoulder Exercises: Prone   Other Prone Exercises  quadruped alt UE/LE x10; alt UE x10; plak 2x30 sec; high plank  2x10;     Other Prone Exercises  prone y 2x10 2lb ; horizontal abduction 2x10 2lb       Shoulder Exercises: Sidelying   Other Sidelying Exercises  side lying ER 2x10 4lb       Shoulder Exercises: Standing   Other Standing Exercises  wall clock red 5x each; wall walk 5x each direction 3 laps red      Shoulder Exercises: Power Warden/ranger Exercises  shoulder row both handles 45 2x10; lat pulldwon 2x10 45lbs       Manual Therapy   Manual therapy comments  PROM flexion, ER, IR; Gentle grade II and III PA mobs for IR              PT Education - 06/24/17 1148    Education provided  Yes    Education Details  reviewed ther-ex     Person(s) Educated  Patient    Methods  Explanation;Demonstration;Tactile cues;Verbal cues    Comprehension  Verbalized understanding;Returned demonstration;Verbal cues required;Tactile cues required;Need further instruction       PT Short Term Goals - 06/14/17 2125      PT SHORT TERM GOAL #1   Title  Patient will deonstrate  5/5 gross left shoulder strength     Baseline  55 degrees     Time  4    Period  Weeks    Status  On-going      PT SHORT TERM GOAL #2   Title  Patient will be indepdnent with basic strengthening program     Baseline  4/10-7/10    Time  4    Period  Weeks    Status  On-going      PT SHORT TERM GOAL #3   Title  Patient will demsotrate full passive ROM     Baseline  135    Time  4    Period  Weeks    Status  On-going      PT SHORT TERM GOAL #4   Title  Patient will be independent with initial HEP     Baseline  independent    Time  4    Period  Weeks    Status  On-going      PT SHORT TERM GOAL #5   Title  Patient will demsotrate 4/5 gross left shoulder strength in available range     Baseline  not tested     Time  4    Period  Weeks    Status  On-going        PT Long Term Goals - 05/26/17 1637      PT LONG TERM GOAL #1   Title  Patient will reach behind back to t-8 without pain in order to  perfrom ADL's     Time  8    Period  Weeks    Status  New      PT LONG TERM GOAL #2   Title  Patient will reach overhead into a cabinet without pain in order to perfrom IADL's     Baseline  can reach overhead with pain    Time  8    Period  Weeks    Status  New      PT LONG TERM GOAL #3   Title  Patient will sleep through the night without pain on the left side     Baseline  recent difficulty sleeping    Time  8    Period  Weeks    Status  New      PT LONG TERM GOAL #4   Title  Patient will demsotrate a 24% limitation on FOTO     Time  8    Period  Weeks    Status  New      PT LONG TERM GOAL #5   Title  Patient will be independent with advanced exercise program.     Time  8    Period  Weeks    Status  New            Plan - 06/24/17 1247    Clinical Impression Statement  Patient continues to tolerate higher lever exercises well. Therapy moved band to red for higher level scpaular exericses. Patient had full range with minor pain in hte [facility] joint with ER.     History and Personal Factors relevant to plan of care:  anxiety, OCD     Clinical Presentation  Evolving    Clinical Decision Making  Low    Rehab Potential  Good    PT Frequency  2x / week    PT Duration  8 weeks    PT Treatment/Interventions  ADLs/Self Care Home Management;Cryotherapy;Electrical Stimulation;Iontophoresis 4mg /ml Dexamethasone;Traction;Ultrasound;Therapeutic exercise;Therapeutic activities;Neuromuscular  re-education;Patient/family education;Taping;Vasopneumatic Device    PT Next Visit Plan  Mnaul therapy to reduce impingement; Scapualr stability exercise; Consider quadruped alternating UE/LE; review ER/IR; add prone series; consdier scapualr clock; mulligan IR stretch      PT Home Exercise Plan  shoulder extension 2x10 green; Scap retraction; shoulder IR/ ER; side lying IR sleeper stretch; row machine; pull down machine; scapular clock        Patient will benefit from skilled therapeutic  intervention in order to improve the following deficits and impairments:  Pain, Postural dysfunction, Decreased strength, Decreased mobility, Decreased activity tolerance, Impaired UE functional use, Decreased range of motion  Visit Diagnosis: Stiffness of left shoulder, not elsewhere classified  Acute pain of left shoulder  Muscle weakness (generalized)  Localized edema     Problem List Patient Active Problem List   Diagnosis Date Noted  . Wellness examination 03/01/2017  . Low testosterone 02/22/2017  . Tear of left glenoid labrum, posterior 05/13/2016  . Fever 01/20/2011  . PLANTAR FASCIITIS, LEFT 05/04/2010  . HEEL PAIN 05/04/2010  . HALLUX RIGIDUS 05/04/2010    Carney Living PT DPT  06/24/2017, 12:50 PM  [facility] [address] [place], [name], [address] Phone: [phone]   Fax:  [phone]  Name: Zyan Coby MRN: [id number] Date of Birth: 20-Jun-1966

## 2017-06-28 ENCOUNTER — Ambulatory Visit: Payer: 59 | Admitting: Physical Therapy

## 2017-06-28 ENCOUNTER — Encounter: Payer: Self-pay | Admitting: Physical Therapy

## 2017-06-28 DIAGNOSIS — M25512 Pain in left shoulder: Secondary | ICD-10-CM | POA: Diagnosis not present

## 2017-06-28 DIAGNOSIS — R600 Localized edema: Secondary | ICD-10-CM | POA: Diagnosis not present

## 2017-06-28 DIAGNOSIS — M25612 Stiffness of left shoulder, not elsewhere classified: Secondary | ICD-10-CM

## 2017-06-28 DIAGNOSIS — M6281 Muscle weakness (generalized): Secondary | ICD-10-CM | POA: Diagnosis not present

## 2017-06-28 NOTE — Therapy (Signed)
[place], [name], [address] Phone: [phone]   Fax:  [phone]  Physical Therapy Treatment  Patient Details  Name: [name] [name] MRN: [id number] Date of Birth: Mar 26, 1966 Referring Provider: Dr Carter Kitten    Encounter Date: 06/28/2017  PT End of Session - 06/28/17 0820    Visit Number  7    Number of Visits  16    Date for PT Re-Evaluation  07/21/17    Authorization Type  [employer] Save     PT Start Time  0800    PT Stop Time  0850    PT Time Calculation (min)  50 min    Activity Tolerance  Patient tolerated treatment well    Behavior During Therapy  [facility] for tasks assessed/performed       Past Medical History:  Diagnosis Date  . Allergy   . Anxiety   . Diverticulitis   . OCD (obsessive compulsive disorder)   . Tear of left glenoid labrum, posterior 05/13/2016    Past Surgical History:  Procedure Laterality Date  . COLONOSCOPY    . PARTIAL COLECTOMY  2009   for diverticulitis  . SHOULDER ARTHROSCOPY WITH BANKART REPAIR Left 05/13/2016   Procedure: SHOULDER ARTHROSCOPY WITH POSTERIOR BANKART REPAIR with extensive debridement, left bursectomy;  Surgeon: Marchia Bond, MD;  Location: [place];  Service: Orthopedics;  Laterality: Left;  pre/Post Op Scalene block  . WISDOM TOOTH EXTRACTION  1988    There were no vitals filed for this visit.  Subjective Assessment - 06/28/17 0805    Subjective  Patient reported some soreness afterthe last treatment. he has had some increased pain at night.     Currently in Pain?  Yes    Pain Score  3     Pain Location  Shoulder    Pain Orientation  Right;Left    Pain Descriptors / Indicators  Aching    Pain Type  Chronic pain    Pain Onset  More than a month ago    Pain Frequency  Constant    Aggravating Factors   sleeping    Pain Relieving Factors  rest     Multiple Pain Sites  No                      [facility] Adult  PT Treatment/Exercise - 06/28/17 0001      Shoulder Exercises: Supine   Other Supine Exercises  supine d2 flexion 4lb supine flexion mid range 2x10 4lb       Shoulder Exercises: Prone   Other Prone Exercises  prone y 2x10 2lb ; horizontal abduction 2x10 2lb; Prone row 2x10 7 lbs       Shoulder Exercises: Sidelying   Other Sidelying Exercises  side lying ER 2x10 4lb       Shoulder Exercises: Standing   Other Standing Exercises  wall clock red 5x each; wall walk 5x each direction 3 laps red    Other Standing Exercises  reviewed use of trigger point cane and tennis ball for upper trap       Shoulder Exercises: Power Warden/ranger Exercises  shoulder row both handles 45 2x10; lat pulldwon 2x10 45lbs       Modalities   Modalities  Cryotherapy      Cryotherapy   Number Minutes Cryotherapy  10 Minutes    Cryotherapy Location  Shoulder    Type of Cryotherapy  Ice pack      Manual Therapy   Manual therapy comments  PROM flexion, ER, IR; Gentle grade II and III PA mobs for IR              PT Education - 06/28/17 0820    Education provided  Yes    Education Details  reviewed symptom mngement     Person(s) Educated  Patient    Methods  Demonstration;Explanation    Comprehension  Verbalized understanding;Returned demonstration       PT Short Term Goals - 06/28/17 0822      PT SHORT TERM GOAL #1   Title  Patient will deonstrate 5/5 gross left shoulder strength     Baseline  55 degrees     Time  4    Period  Weeks    Status  On-going      PT SHORT TERM GOAL #2   Title  Patient will be indepdnent with basic strengthening program     Baseline  4/10-7/10    Time  4    Period  Weeks    Status  On-going      PT SHORT TERM GOAL #3   Title  Patient will demsotrate full passive ROM     Baseline  135    Time  4    Period  Weeks    Status  On-going      PT SHORT TERM GOAL #4   Title  Patient will be independent with initial HEP     Baseline  independent     Time  4    Period  Weeks    Status  On-going      PT SHORT TERM GOAL #5   Title  Patient will demsotrate 4/5 gross left shoulder strength in available range     Baseline  not tested     Time  4    Period  Weeks    Status  On-going        PT Long Term Goals - 05/26/17 1637      PT LONG TERM GOAL #1   Title  Patient will reach behind back to t-8 without pain in order to perfrom ADL's     Time  8    Period  Weeks    Status  New      PT LONG TERM GOAL #2   Title  Patient will reach overhead into a cabinet without pain in order to perfrom IADL's     Baseline  can reach overhead with pain    Time  8    Period  Weeks    Status  New      PT LONG TERM GOAL #3   Title  Patient will sleep through the night without pain on the left side     Baseline  recent difficulty sleeping    Time  8    Period  Weeks    Status  New      PT LONG TERM GOAL #4   Title  Patient will demsotrate a 24% limitation on FOTO     Time  8    Period  Weeks    Status  New      PT LONG TERM GOAL #5   Title  Patient will be independent with advanced exercise program.     Time  8    Period  Weeks    Status  New  Plan - 06/28/17 9458    Clinical Impression Statement  Improved pain with mobilization. Therapy scaled back range with some of his strengthening exercises. No increase in pain with treatment. Therapy will continue to progress as tolerated.     Rehab Potential  Good    PT Frequency  2x / week    PT Duration  8 weeks    PT Treatment/Interventions  ADLs/Self Care Home Management;Cryotherapy;Electrical Stimulation;Iontophoresis 4mg /ml Dexamethasone;Traction;Ultrasound;Therapeutic exercise;Therapeutic activities;Neuromuscular re-education;Patient/family education;Taping;Vasopneumatic Device    PT Next Visit Plan  Mnaul therapy to reduce impingement; Scapualr stability exercise; Consider quadruped alternating UE/LE; review ER/IR; add prone series; consdier scapualr clock; mulligan IR  stretch      PT Home Exercise Plan  shoulder extension 2x10 green; Scap retraction; shoulder IR/ ER; side lying IR sleeper stretch; row machine; pull down machine; scapular clock     Consulted and Agree with Plan of Care  Patient       Patient will benefit from skilled therapeutic intervention in order to improve the following deficits and impairments:  Pain, Postural dysfunction, Decreased strength, Decreased mobility, Decreased activity tolerance, Impaired UE functional use, Decreased range of motion  Visit Diagnosis: Stiffness of left shoulder, not elsewhere classified  Acute pain of left shoulder  Muscle weakness (generalized)  Localized edema     Problem List Patient Active Problem List   Diagnosis Date Noted  . Wellness examination 03/01/2017  . Low testosterone 02/22/2017  . Tear of left glenoid labrum, posterior 05/13/2016  . Fever 01/20/2011  . PLANTAR FASCIITIS, LEFT 05/04/2010  . HEEL PAIN 05/04/2010  . HALLUX RIGIDUS 05/04/2010    Carney Living 06/28/2017, 3:25 PM  [facility] [address] [place], [name], [address] Phone: [phone]   Fax:  [phone]  Name: Kriss Ishler MRN: [id number] Date of Birth: 03-Oct-1965

## 2017-06-30 ENCOUNTER — Ambulatory Visit: Payer: 59 | Admitting: Physical Therapy

## 2017-06-30 ENCOUNTER — Encounter: Payer: Self-pay | Admitting: Physical Therapy

## 2017-06-30 DIAGNOSIS — R600 Localized edema: Secondary | ICD-10-CM | POA: Diagnosis not present

## 2017-06-30 DIAGNOSIS — M6281 Muscle weakness (generalized): Secondary | ICD-10-CM | POA: Diagnosis not present

## 2017-06-30 DIAGNOSIS — M25612 Stiffness of left shoulder, not elsewhere classified: Secondary | ICD-10-CM

## 2017-06-30 DIAGNOSIS — M25512 Pain in left shoulder: Secondary | ICD-10-CM | POA: Diagnosis not present

## 2017-06-30 NOTE — Therapy (Signed)
[place], [name], [address] Phone: [phone]   Fax:  [phone]  Physical Therapy Treatment  Patient Details  Name: [name] [name] MRN: [id number] Date of Birth: [birth year] Referring Provider: Dr Carter Kitten    Encounter Date: 06/30/2017  PT End of Session - 06/30/17 0824    Visit Number  8    Number of Visits  16    Date for PT Re-Evaluation  07/21/17    Authorization Type  [employer] Save     PT Start Time  0800    PT Stop Time  0842    PT Time Calculation (min)  42 min    Activity Tolerance  Patient tolerated treatment well    Behavior During Therapy  [facility] for tasks assessed/performed       Past Medical History:  Diagnosis Date  . Allergy   . Anxiety   . Diverticulitis   . OCD (obsessive compulsive disorder)   . Tear of left glenoid labrum, posterior 05/13/2016    Past Surgical History:  Procedure Laterality Date  . COLONOSCOPY    . PARTIAL COLECTOMY  2009   for diverticulitis  . SHOULDER ARTHROSCOPY WITH BANKART REPAIR Left 05/13/2016   Procedure: SHOULDER ARTHROSCOPY WITH POSTERIOR BANKART REPAIR with extensive debridement, left bursectomy;  Surgeon: Marchia Bond, MD;  Location: [place];  Service: Orthopedics;  Laterality: Left;  pre/Post Op Scalene block  . WISDOM TOOTH EXTRACTION  1988    There were no vitals filed for this visit.  Subjective Assessment - 06/30/17 0804    Subjective  Patient reports his shoulder was not as bad after the last visit. He had to put a matress in his house yesterday so his shoulder is a little sore.     Limitations  Standing    Patient Stated Goals  to have less pain in his shoulder     Currently in Pain?  Yes    Pain Score  3     Pain Location  Shoulder    Pain Orientation  Right;Left    Pain Descriptors / Indicators  Aching    Pain Type  Chronic pain    Pain Onset  More than a month ago    Pain Frequency  Constant    Aggravating Factors   sleeping     Pain Relieving Factors  rest    Multiple Pain Sites  No                      [facility] Adult PT Treatment/Exercise - 06/30/17 0001      Shoulder Exercises: Supine   Other Supine Exercises  supine d2 flexion 4lb supine flexion mid range 2x10 4lb       Shoulder Exercises: Prone   Other Prone Exercises  prone y 2x10 2lb ; horizontal abduction 2x10 2lb; Prone row 2x10 7 lbs       Shoulder Exercises: Sidelying   Other Sidelying Exercises  side lying ER 2x10 4lb       Shoulder Exercises: Standing   Other Standing Exercises  wall clock red 5x each; wall walk 5x each direction 3 laps red; standing shoulder flexion 3lb 2x10 Y 2x10 in the mirror; Standing wall flexion with retraction 2x10     Other Standing Exercises  reviewed use of trigger point cane and tennis ball for upper trap       Shoulder Exercises: Power Futures trader  shoulder row both handles 45 2x10; lat pulldwon 2x10 45lbs       Modalities   Modalities  Cryotherapy      Cryotherapy   Number Minutes Cryotherapy  10 Minutes    Cryotherapy Location  Shoulder    Type of Cryotherapy  Ice pack      Manual Therapy   Manual Therapy  Scapular mobilization    Manual therapy comments  PROM flexion, ER, IR; Gentle grade II and III PA mobs for IR     Scapular Mobilization  with seated flexion             PT Education - 06/30/17 0806    Education provided  Yes    Education Details  reviewed symptpok mamgement     Person(s) Educated  Patient    Methods  Explanation;Demonstration;Tactile cues;Verbal cues    Comprehension  Verbalized understanding;Returned demonstration;Verbal cues required;Tactile cues required       PT Short Term Goals - 06/28/17 6789      PT SHORT TERM GOAL #1   Title  Patient will deonstrate 5/5 gross left shoulder strength     Baseline  55 degrees     Time  4    Period  Weeks    Status  On-going      PT SHORT TERM GOAL #2    Title  Patient will be indepdnent with basic strengthening program     Baseline  4/10-7/10    Time  4    Period  Weeks    Status  On-going      PT SHORT TERM GOAL #3   Title  Patient will demsotrate full passive ROM     Baseline  135    Time  4    Period  Weeks    Status  On-going      PT SHORT TERM GOAL #4   Title  Patient will be independent with initial HEP     Baseline  independent    Time  4    Period  Weeks    Status  On-going      PT SHORT TERM GOAL #5   Title  Patient will demsotrate 4/5 gross left shoulder strength in available range     Baseline  not tested     Time  4    Period  Weeks    Status  On-going        PT Long Term Goals - 05/26/17 1637      PT LONG TERM GOAL #1   Title  Patient will reach behind back to t-8 without pain in order to perfrom ADL's     Time  8    Period  Weeks    Status  New      PT LONG TERM GOAL #2   Title  Patient will reach overhead into a cabinet without pain in order to perfrom IADL's     Baseline  can reach overhead with pain    Time  8    Period  Weeks    Status  New      PT LONG TERM GOAL #3   Title  Patient will sleep through the night without pain on the left side     Baseline  recent difficulty sleeping    Time  8    Period  Weeks    Status  New      PT LONG TERM GOAL #4   Title  Patient will demsotrate a 24%  limitation on FOTO     Time  8    Period  Weeks    Status  New      PT LONG TERM GOAL #5   Title  Patient will be independent with advanced exercise program.     Time  8    Period  Weeks    Status  New            Plan - 06/30/17 1213    Clinical Impression Statement  Therapy worked more on manual therapy today. He has a pop in his arm around 75 degrees of extenral rotation. Therapy attmped to use mobilizations to reduce popping. He had no change after treatment. It was noted that the patient had slight winging with over head Y. Therapy added in standing low trap exercises.     Clinical  Presentation  Evolving    Clinical Decision Making  Low    Rehab Potential  Good    PT Frequency  2x / week    PT Duration  8 weeks    PT Treatment/Interventions  ADLs/Self Care Home Management;Cryotherapy;Electrical Stimulation;Iontophoresis 4mg /ml Dexamethasone;Traction;Ultrasound;Therapeutic exercise;Therapeutic activities;Neuromuscular re-education;Patient/family education;Taping;Vasopneumatic Device    PT Next Visit Plan  Mnaul therapy to reduce impingement; Scapualr stability exercise; Consider quadruped alternating UE/LE; review ER/IR; add prone series; consdier scapualr clock; mulligan IR stretch      PT Home Exercise Plan  shoulder extension 2x10 green; Scap retraction; shoulder IR/ ER; side lying IR sleeper stretch; row machine; pull down machine; scapular clock        Patient will benefit from skilled therapeutic intervention in order to improve the following deficits and impairments:  Pain, Postural dysfunction, Decreased strength, Decreased mobility, Decreased activity tolerance, Impaired UE functional use, Decreased range of motion  Visit Diagnosis: Stiffness of left shoulder, not elsewhere classified  Acute pain of left shoulder  Muscle weakness (generalized)  Localized edema     Problem List Patient Active Problem List   Diagnosis Date Noted  . Wellness examination 03/01/2017  . Low testosterone 02/22/2017  . Tear of left glenoid labrum, posterior 05/13/2016  . Fever 01/20/2011  . PLANTAR FASCIITIS, LEFT 05/04/2010  . HEEL PAIN 05/04/2010  . HALLUX RIGIDUS 05/04/2010    Carney Living  PT DPT  06/30/2017, 12:56 PM  [facility] [address] [place], [name], [address] Phone: [phone]   Fax:  [phone]  Name: Taytum Wheller MRN: [id number] Date of Birth: Mar 01, 1966

## 2017-07-04 ENCOUNTER — Ambulatory Visit: Payer: 59 | Admitting: Physical Therapy

## 2017-07-04 ENCOUNTER — Encounter: Payer: Self-pay | Admitting: Physical Therapy

## 2017-07-04 DIAGNOSIS — M6281 Muscle weakness (generalized): Secondary | ICD-10-CM

## 2017-07-04 DIAGNOSIS — M25612 Stiffness of left shoulder, not elsewhere classified: Secondary | ICD-10-CM

## 2017-07-04 DIAGNOSIS — R600 Localized edema: Secondary | ICD-10-CM

## 2017-07-04 DIAGNOSIS — M25512 Pain in left shoulder: Secondary | ICD-10-CM | POA: Diagnosis not present

## 2017-07-04 NOTE — Therapy (Signed)
[place], [name], [address] Phone: [phone]   Fax:  [phone]  Physical Therapy Treatment  Patient Details  Name: [name] [name] MRN: [id number] Date of Birth: Oct 16, 1965 Referring Provider: Dr Carter Kitten    Encounter Date: 07/04/2017  PT End of Session - 07/04/17 1127    Visit Number  9    Number of Visits  16    Date for PT Re-Evaluation  07/21/17    Authorization Type  [employer] Save     PT Start Time  1100    PT Stop Time  1140    PT Time Calculation (min)  40 min    Activity Tolerance  Patient tolerated treatment well    Behavior During Therapy  [facility] for tasks assessed/performed       Past Medical History:  Diagnosis Date  . Allergy   . Anxiety   . Diverticulitis   . OCD (obsessive compulsive disorder)   . Tear of left glenoid labrum, posterior 05/13/2016    Past Surgical History:  Procedure Laterality Date  . COLONOSCOPY    . PARTIAL COLECTOMY  2009   for diverticulitis  . SHOULDER ARTHROSCOPY WITH BANKART REPAIR Left 05/13/2016   Procedure: SHOULDER ARTHROSCOPY WITH POSTERIOR BANKART REPAIR with extensive debridement, left bursectomy;  Surgeon: Marchia Bond, MD;  Location: [place];  Service: Orthopedics;  Laterality: Left;  pre/Post Op Scalene block  . WISDOM TOOTH EXTRACTION  1988    There were no vitals filed for this visit.  Subjective Assessment - 07/04/17 1103    Subjective  Patient reports his shoulder was not as bad after the last visit. He had to put a matress in his house yesterday so his shoulder is a little sore.     Limitations  Standing    Patient Stated Goals  to have less pain in his shoulder     Currently in Pain?  Yes    Pain Score  3     Pain Orientation  Right;Left    Pain Descriptors / Indicators  Aching    Pain Type  Chronic pain    Pain Frequency  Constant    Aggravating Factors   sleeping     Pain Relieving Factors  rest     Multiple Pain Sites  No                      [facility] Adult PT Treatment/Exercise - 07/04/17 0001      Shoulder Exercises: Supine   Other Supine Exercises  supine d2 flexion 4lb supine flexion mid range 2x10 4lb       Shoulder Exercises: Prone   Other Prone Exercises  prone y 2x10 2lb ; horizontal abduction 2x10 2lb; Prone row 2x10 7 lbs       Shoulder Exercises: Sidelying   Other Sidelying Exercises  side lying ER 2x10 4lb       Shoulder Exercises: Standing   Other Standing Exercises  wall clock red 5x each; wall walk 5x each direction 3 laps red; standing shoulder flexion 3lb 2x10 Y 2x10 in the mirror; Standing wall flexion with retraction 2x10     Other Standing Exercises  reviewed use of trigger point cane and tennis ball for upper trap       Shoulder Exercises: Power Warden/ranger Exercises  shoulder row both handles 45 2x10; lat pulldwon 2x10 25lbs  Modalities   Modalities  Cryotherapy      Cryotherapy   Cryotherapy Location  --      Manual Therapy   Manual Therapy  Scapular mobilization    Manual therapy comments  PROM flexion, ER, IR; Gentle grade II and III PA mobs for IR     Scapular Mobilization  with seated flexion             PT Education - 07/04/17 1125    Education provided  Yes    Education Details  reviewed exercises     Person(s) Educated  Patient    Methods  Explanation;Demonstration;Tactile cues;Verbal cues    Comprehension  Verbalized understanding;Returned demonstration;Verbal cues required;Tactile cues required       PT Short Term Goals - 06/28/17 8469      PT SHORT TERM GOAL #1   Title  Patient will deonstrate 5/5 gross left shoulder strength     Baseline  55 degrees     Time  4    Period  Weeks    Status  On-going      PT SHORT TERM GOAL #2   Title  Patient will be indepdnent with basic strengthening program     Baseline  4/10-7/10    Time  4    Period  Weeks    Status  On-going      PT SHORT TERM  GOAL #3   Title  Patient will demsotrate full passive ROM     Baseline  135    Time  4    Period  Weeks    Status  On-going      PT SHORT TERM GOAL #4   Title  Patient will be independent with initial HEP     Baseline  independent    Time  4    Period  Weeks    Status  On-going      PT SHORT TERM GOAL #5   Title  Patient will demsotrate 4/5 gross left shoulder strength in available range     Baseline  not tested     Time  4    Period  Weeks    Status  On-going        PT Long Term Goals - 05/26/17 1637      PT LONG TERM GOAL #1   Title  Patient will reach behind back to t-8 without pain in order to perfrom ADL's     Time  8    Period  Weeks    Status  New      PT LONG TERM GOAL #2   Title  Patient will reach overhead into a cabinet without pain in order to perfrom IADL's     Baseline  can reach overhead with pain    Time  8    Period  Weeks    Status  New      PT LONG TERM GOAL #3   Title  Patient will sleep through the night without pain on the left side     Baseline  recent difficulty sleeping    Time  8    Period  Weeks    Status  New      PT LONG TERM GOAL #4   Title  Patient will demsotrate a 24% limitation on FOTO     Time  8    Period  Weeks    Status  New      PT LONG TERM GOAL #5   Title  Patient will be independent with advanced exercise program.     Time  8    Period  Weeks    Status  New            Plan - 07/04/17 1127    Clinical Impression Statement  Patient had full range with no popping today. He had less crepitus with exercises as well. He has been having pain at night but he feels like it may be because of his matress. Continue to work on English as a second language teacher.     Clinical Presentation  Evolving    Clinical Decision Making  Low    Rehab Potential  Good    PT Frequency  2x / week    PT Duration  8 weeks    PT Treatment/Interventions  ADLs/Self Care Home Management;Cryotherapy;Electrical Stimulation;Iontophoresis 4mg /ml  Dexamethasone;Traction;Ultrasound;Therapeutic exercise;Therapeutic activities;Neuromuscular re-education;Patient/family education;Taping;Vasopneumatic Device    PT Next Visit Plan  Mnaul therapy to reduce impingement; Scapualr stability exercise; Consider quadruped alternating UE/LE; review ER/IR; add prone series; consdier scapualr clock; mulligan IR stretch      PT Home Exercise Plan  shoulder extension 2x10 green; Scap retraction; shoulder IR/ ER; side lying IR sleeper stretch; row machine; pull down machine; scapular clock     Consulted and Agree with Plan of Care  Patient       Patient will benefit from skilled therapeutic intervention in order to improve the following deficits and impairments:  Pain, Postural dysfunction, Decreased strength, Decreased mobility, Decreased activity tolerance, Impaired UE functional use, Decreased range of motion  Visit Diagnosis: Stiffness of left shoulder, not elsewhere classified  Acute pain of left shoulder  Muscle weakness (generalized)  Localized edema     Problem List Patient Active Problem List   Diagnosis Date Noted  . Wellness examination 03/01/2017  . Low testosterone 02/22/2017  . Tear of left glenoid labrum, posterior 05/13/2016  . Fever 01/20/2011  . PLANTAR FASCIITIS, LEFT 05/04/2010  . HEEL PAIN 05/04/2010  . HALLUX RIGIDUS 05/04/2010    Carney Living  PT DPT  07/04/2017, 11:54 AM  [facility] [address] [place], [name], [address] Phone: [phone]   Fax:  [phone]  Name: Erin Uecker MRN: [id number] Date of Birth: 1965/07/29

## 2017-07-07 ENCOUNTER — Ambulatory Visit: Payer: 59 | Admitting: Physical Therapy

## 2017-07-07 ENCOUNTER — Encounter: Payer: Self-pay | Admitting: Physical Therapy

## 2017-07-07 DIAGNOSIS — R600 Localized edema: Secondary | ICD-10-CM | POA: Diagnosis not present

## 2017-07-07 DIAGNOSIS — M6281 Muscle weakness (generalized): Secondary | ICD-10-CM

## 2017-07-07 DIAGNOSIS — M25612 Stiffness of left shoulder, not elsewhere classified: Secondary | ICD-10-CM

## 2017-07-07 DIAGNOSIS — M25512 Pain in left shoulder: Secondary | ICD-10-CM

## 2017-07-08 NOTE — Therapy (Signed)
[place], [name], [address] Phone: [phone]   Fax:  [phone]  Physical Therapy Treatment  Patient Details  Name: [name] [name] MRN: [id number] Date of Birth: Oct 22, 1965 Referring Provider: Dr Carter Kitten    Encounter Date: 07/07/2017  PT End of Session - 07/07/17 1435    Visit Number  10    Number of Visits  16    Date for PT Re-Evaluation  07/21/17    Authorization Type  [employer] Save     PT Start Time  7858    PT Stop Time  1458    PT Time Calculation (min)  43 min    Activity Tolerance  Patient tolerated treatment well    Behavior During Therapy  [facility] for tasks assessed/performed       Past Medical History:  Diagnosis Date  . Allergy   . Anxiety   . Diverticulitis   . OCD (obsessive compulsive disorder)   . Tear of left glenoid labrum, posterior 05/13/2016    Past Surgical History:  Procedure Laterality Date  . COLONOSCOPY    . PARTIAL COLECTOMY  2009   for diverticulitis  . SHOULDER ARTHROSCOPY WITH BANKART REPAIR Left 05/13/2016   Procedure: SHOULDER ARTHROSCOPY WITH POSTERIOR BANKART REPAIR with extensive debridement, left bursectomy;  Surgeon: Marchia Bond, MD;  Location: [place];  Service: Orthopedics;  Laterality: Left;  pre/Post Op Scalene block  . WISDOM TOOTH EXTRACTION  1988    There were no vitals filed for this visit.  Subjective Assessment - 07/07/17 1423    Limitations  Standing    Patient Stated Goals  to have less pain in his shoulder     Currently in Pain?  Yes    Pain Score  3     Pain Location  Shoulder    Pain Orientation  Right;Left    Pain Descriptors / Indicators  Aching    Pain Type  Chronic pain    Pain Onset  More than a month ago    Pain Frequency  Constant                      [facility] Adult PT Treatment/Exercise - 07/08/17 0001      Shoulder Exercises: Supine   Protraction  Both;Limitations    Protraction  Limitations  2x10    Other Supine Exercises  supine d2 flexion 4lb supine flexion mid range 2x10 4lb       Shoulder Exercises: Prone   Other Prone Exercises  prone y 2x10 2lb ; horizontal abduction 2x10 2lb; Prone row 2x10 7 lbs       Shoulder Exercises: Sidelying   Other Sidelying Exercises  side lying ER 2x10 4lb       Shoulder Exercises: Standing   Other Standing Exercises  wall clock red 5x each; wall walk 5x each direction 3 laps red; standing shoulder flexion 3lb 2x10 Y 2x10 in the mirror; Standing wall flexion with retraction 2x10       Shoulder Exercises: Power Warden/ranger Exercises  shoulder row both handles 35 2x10; lat pulldwon 2x10 25lbs       Modalities   Modalities  Cryotherapy;Iontophoresis      Cryotherapy   Cryotherapy Location  --      Iontophoresis   Type of Iontophoresis  Dexamethasone    Location  AC joint     Dose  1cc  Time  slow release       Manual Therapy   Manual Therapy  Scapular mobilization    Manual therapy comments  PROM flexion, ER, IR; Gentle grade II and III PA mobs for IR     Scapular Mobilization  with seated flexion             PT Education - 07/07/17 1435    Education provided  Yes    Education Details  reviewed use of ionto     Person(s) Educated  Patient    Methods  Explanation;Demonstration;Tactile cues;Verbal cues    Comprehension  Verbalized understanding;Returned demonstration;Verbal cues required;Tactile cues required       PT Short Term Goals - 07/08/17 0810      PT SHORT TERM GOAL #1   Title  Patient will deonstrate 5/5 gross left shoulder strength     Baseline  55 degrees     Time  4    Period  Weeks    Status  On-going      PT SHORT TERM GOAL #2   Title  Patient will be indepdnent with basic strengthening program     Baseline  4/10-7/10    Time  4    Period  Weeks    Status  On-going      PT SHORT TERM GOAL #3   Title  Patient will demsotrate full passive ROM     Baseline  135     Time  4    Period  Weeks    Status  On-going      PT SHORT TERM GOAL #4   Title  Patient will be independent with initial HEP     Baseline  independent    Time  4    Period  Weeks    Status  On-going      PT SHORT TERM GOAL #5   Title  Patient will demsotrate 4/5 gross left shoulder strength in available range     Baseline  not tested     Time  4    Period  Weeks    Status  On-going        PT Long Term Goals - 07/07/17 1445      PT LONG TERM GOAL #1   Title  Patient will reach behind back to t-8 without pain in order to perfrom ADL's     Baseline  L-1 with pain    Time  8    Period  Weeks    Status  On-going      PT LONG TERM GOAL #2   Title  Patient will reach overhead into a cabinet without pain in order to perfrom IADL's     Baseline  can reach overhead with pain    Time  8    Period  Weeks    Status  On-going      PT LONG TERM GOAL #3   Title  Patient will sleep through the night without pain on the left side     Baseline  recent difficulty sleeping    Time  8    Period  Weeks    Status  On-going      PT LONG TERM GOAL #4   Title  Patient will demsotrate a 24% limitation on FOTO     Time  8    Period  Weeks    Status  On-going      PT LONG TERM GOAL #5   Title  Patient will  be independent with advanced exercise program.     Time  8    Period  Weeks    Status  On-going            Plan - 07/07/17 1436    Clinical Impression Statement  Patients range continues to be full but he is having difficulty with quick movements . His strength is improving but his pain is staying about the same. We will continue strengthening for a few more weeks. If he dosent have improvement with pain we sill send him back to MD. He was advised over the weekend to do nothjing overhead. Therapy also added an ionto patch whcich he has had benefit from in the past. The patients FOTO score decreased to 41% limitation.     Clinical Presentation  Evolving    Clinical Decision  Making  Low    Rehab Potential  Good    PT Frequency  2x / week    PT Duration  8 weeks    PT Treatment/Interventions  ADLs/Self Care Home Management;Cryotherapy;Electrical Stimulation;Iontophoresis 4mg /ml Dexamethasone;Traction;Ultrasound;Therapeutic exercise;Therapeutic activities;Neuromuscular re-education;Patient/family education;Taping;Vasopneumatic Device    PT Next Visit Plan  Mnaul therapy to reduce impingement; Scapualr stability exercise; Consider quadruped alternating UE/LE; review ER/IR; add prone series; consdier scapualr clock; mulligan IR stretch      PT Home Exercise Plan  shoulder extension 2x10 green; Scap retraction; shoulder IR/ ER; side lying IR sleeper stretch; row machine; pull down machine; scapular clock     Consulted and Agree with Plan of Care  Patient       Patient will benefit from skilled therapeutic intervention in order to improve the following deficits and impairments:  Pain, Postural dysfunction, Decreased strength, Decreased mobility, Decreased activity tolerance, Impaired UE functional use, Decreased range of motion  Visit Diagnosis: Stiffness of left shoulder, not elsewhere classified  Acute pain of left shoulder  Muscle weakness (generalized)  Localized edema     Problem List Patient Active Problem List   Diagnosis Date Noted  . Wellness examination 03/01/2017  . Low testosterone 02/22/2017  . Tear of left glenoid labrum, posterior 05/13/2016  . Fever 01/20/2011  . PLANTAR FASCIITIS, LEFT 05/04/2010  . HEEL PAIN 05/04/2010  . HALLUX RIGIDUS 05/04/2010    Carney Living  PT DPT  07/08/2017, 8:22 AM  [facility] [address] [place], [name], [address] Phone: [phone]   Fax:  [phone]  Name: [name] [name] MRN: [id number] Date of Birth: 01/05/1966

## 2017-07-14 ENCOUNTER — Ambulatory Visit: Payer: 59 | Attending: Specialist | Admitting: Physical Therapy

## 2017-07-14 ENCOUNTER — Encounter: Payer: Self-pay | Admitting: Physical Therapy

## 2017-07-14 DIAGNOSIS — R600 Localized edema: Secondary | ICD-10-CM | POA: Diagnosis not present

## 2017-07-14 DIAGNOSIS — M25512 Pain in left shoulder: Secondary | ICD-10-CM | POA: Diagnosis not present

## 2017-07-14 DIAGNOSIS — M25612 Stiffness of left shoulder, not elsewhere classified: Secondary | ICD-10-CM | POA: Diagnosis not present

## 2017-07-14 DIAGNOSIS — M6281 Muscle weakness (generalized): Secondary | ICD-10-CM | POA: Diagnosis not present

## 2017-07-15 ENCOUNTER — Encounter: Payer: Self-pay | Admitting: Physical Therapy

## 2017-07-15 NOTE — Therapy (Signed)
[place], [name], [address] Phone: [phone]   Fax:  [phone]  Physical Therapy Treatment  Patient Details  Name: [name] [name] MRN: [id number] Date of Birth: 06-05-1966 Referring Provider: Dr Carter Kitten    Encounter Date: 07/14/2017  PT End of Session - 07/14/17 0937    Visit Number  11    Number of Visits  16    Date for PT Re-Evaluation  07/21/17    Authorization Type  [employer] Save     PT Start Time  0932    PT Stop Time  1012    PT Time Calculation (min)  40 min    Activity Tolerance  Patient tolerated treatment well    Behavior During Therapy  [facility] for tasks assessed/performed       Past Medical History:  Diagnosis Date  . Allergy   . Anxiety   . Diverticulitis   . OCD (obsessive compulsive disorder)   . Tear of left glenoid labrum, posterior 05/13/2016    Past Surgical History:  Procedure Laterality Date  . COLONOSCOPY    . PARTIAL COLECTOMY  2009   for diverticulitis  . SHOULDER ARTHROSCOPY WITH BANKART REPAIR Left 05/13/2016   Procedure: SHOULDER ARTHROSCOPY WITH POSTERIOR BANKART REPAIR with extensive debridement, left bursectomy;  Surgeon: Marchia Bond, MD;  Location: [place];  Service: Orthopedics;  Laterality: Left;  pre/Post Op Scalene block  . WISDOM TOOTH EXTRACTION  1988    There were no vitals filed for this visit.  Subjective Assessment - 07/14/17 0935    Subjective  Patient is doing less activty and having less pain. He moved an armour the other day and felt some pain. He is still feeling some pain when he is reaching up to wave to people.     Limitations  Standing    Patient Stated Goals  to have less pain in his shoulder     Currently in Pain?  Yes    Pain Score  1     Pain Location  Shoulder    Pain Orientation  Right;Left    Pain Descriptors / Indicators  Aching    Pain Type  Chronic pain    Pain Onset  More than a month ago    Pain  Frequency  Constant    Aggravating Factors   sleeping     Pain Relieving Factors  rest     Multiple Pain Sites  No                      [facility] Adult PT Treatment/Exercise - 07/15/17 0001      Shoulder Exercises: Supine   Protraction  Both;Limitations    Protraction Limitations  2x10 2lb     Other Supine Exercises  supine d2 flexion2lb supine flexion mid range 2x10 2lb       Shoulder Exercises: Prone   Other Prone Exercises   ; horizontal abduction 2x10 2lb; Prone row 2x10 5 lbs       Shoulder Exercises: Sidelying   Other Sidelying Exercises  side lying ER 2x10 3lb       Shoulder Exercises: Standing   Other Standing Exercises  wall clock red 5x each; wall walk 5x each direction 3 laps red; standing shoulder flexion 3lb 2x10 Y 2x10 in the mirror; Standing wall flexion with retraction 2x10       Shoulder Exercises: Power Futures trader  shoulder row both handles 35 2x10; lat pulldwon 2x10 25lbs       Modalities   Modalities  Cryotherapy;Iontophoresis      Iontophoresis   Type of Iontophoresis  Dexamethasone    Location  AC joint     Dose  1cc     Time  slow release       Manual Therapy   Manual Therapy  Scapular mobilization    Manual therapy comments  PROM flexion, ER, IR; Gentle grade II and III PA mobs for IR     Scapular Mobilization  with seated flexion             PT Education - 07/14/17 0937    Education provided  Yes    Education Details  reviewed HEP     Person(s) Educated  Patient    Methods  Explanation;Demonstration;Tactile cues;Verbal cues    Comprehension  Verbalized understanding;Returned demonstration;Verbal cues required;Tactile cues required       PT Short Term Goals - 07/08/17 0810      PT SHORT TERM GOAL #1   Title  Patient will deonstrate 5/5 gross left shoulder strength     Baseline  55 degrees     Time  4    Period  Weeks    Status  On-going      PT SHORT TERM GOAL #2   Title  Patient will be  indepdnent with basic strengthening program     Baseline  4/10-7/10    Time  4    Period  Weeks    Status  On-going      PT SHORT TERM GOAL #3   Title  Patient will demsotrate full passive ROM     Baseline  135    Time  4    Period  Weeks    Status  On-going      PT SHORT TERM GOAL #4   Title  Patient will be independent with initial HEP     Baseline  independent    Time  4    Period  Weeks    Status  On-going      PT SHORT TERM GOAL #5   Title  Patient will demsotrate 4/5 gross left shoulder strength in available range     Baseline  not tested     Time  4    Period  Weeks    Status  On-going        PT Long Term Goals - 07/07/17 1445      PT LONG TERM GOAL #1   Title  Patient will reach behind back to t-8 without pain in order to perfrom ADL's     Baseline  L-1 with pain    Time  8    Period  Weeks    Status  On-going      PT LONG TERM GOAL #2   Title  Patient will reach overhead into a cabinet without pain in order to perfrom IADL's     Baseline  can reach overhead with pain    Time  8    Period  Weeks    Status  On-going      PT LONG TERM GOAL #3   Title  Patient will sleep through the night without pain on the left side     Baseline  recent difficulty sleeping    Time  8    Period  Weeks    Status  On-going      PT  LONG TERM GOAL #4   Title  Patient will demsotrate a 24% limitation on FOTO     Time  8    Period  Weeks    Status  On-going      PT LONG TERM GOAL #5   Title  Patient will be independent with advanced exercise program.     Time  8    Period  Weeks    Status  On-going            Plan - 07/14/17 5701    Clinical Impression Statement  Patient continues to have pain at end ranges and has some pain with exeternal rotatio. Therapy kept him sclaed back to pain free exercises. He was advised at this point to contact his MD. He does not seem to be making progress at this point.     Clinical Presentation  Evolving    Clinical Decision  Making  Low    PT Frequency  2x / week    PT Duration  8 weeks    PT Treatment/Interventions  ADLs/Self Care Home Management;Cryotherapy;Electrical Stimulation;Iontophoresis 4mg /ml Dexamethasone;Traction;Ultrasound;Therapeutic exercise;Therapeutic activities;Neuromuscular re-education;Patient/family education;Taping;Vasopneumatic Device    PT Next Visit Plan  Mnaul therapy to reduce impingement; Scapualr stability exercise; Consider quadruped alternating UE/LE; review ER/IR; add prone series; consdier scapualr clock; mulligan IR stretch      PT Home Exercise Plan  shoulder extension 2x10 green; Scap retraction; shoulder IR/ ER; side lying IR sleeper stretch; row machine; pull down machine; scapular clock     Consulted and Agree with Plan of Care  Patient       Patient will benefit from skilled therapeutic intervention in order to improve the following deficits and impairments:  Pain, Postural dysfunction, Decreased strength, Decreased mobility, Decreased activity tolerance, Impaired UE functional use, Decreased range of motion  Visit Diagnosis: Stiffness of left shoulder, not elsewhere classified  Acute pain of left shoulder  Muscle weakness (generalized)  Localized edema     Problem List Patient Active Problem List   Diagnosis Date Noted  . Wellness examination 03/01/2017  . Low testosterone 02/22/2017  . Tear of left glenoid labrum, posterior 05/13/2016  . Fever 01/20/2011  . PLANTAR FASCIITIS, LEFT 05/04/2010  . HEEL PAIN 05/04/2010  . HALLUX RIGIDUS 05/04/2010    Carney Living PT DPT  07/15/2017, 8:02 AM  [facility] [address] [place], [name], [address] Phone: [phone]   Fax:  [phone]  Name: [name] [name] MRN: [id number] Date of Birth: [birth year]

## 2017-07-19 ENCOUNTER — Encounter: Payer: Self-pay | Admitting: Physical Therapy

## 2017-07-19 ENCOUNTER — Ambulatory Visit: Payer: 59 | Admitting: Physical Therapy

## 2017-07-19 DIAGNOSIS — M6281 Muscle weakness (generalized): Secondary | ICD-10-CM

## 2017-07-19 DIAGNOSIS — M25512 Pain in left shoulder: Secondary | ICD-10-CM

## 2017-07-19 DIAGNOSIS — R600 Localized edema: Secondary | ICD-10-CM

## 2017-07-19 DIAGNOSIS — M25612 Stiffness of left shoulder, not elsewhere classified: Secondary | ICD-10-CM

## 2017-07-19 NOTE — Therapy (Signed)
[place], [name], [address] Phone: [phone]   Fax:  [phone]  Physical Therapy Treatment  Patient Details  Name: [name] [name] MRN: [id number] Date of Birth: February 29, 1966 Referring Provider: Dr Carter Kitten    Encounter Date: 07/19/2017  PT End of Session - 07/19/17 1025    Visit Number  12    Number of Visits  16    Date for PT Re-Evaluation  07/21/17    Authorization Type  [employer] Save     PT Start Time  1016    PT Stop Time  1054    PT Time Calculation (min)  38 min    Activity Tolerance  Patient tolerated treatment well    Behavior During Therapy  [facility] for tasks assessed/performed       Past Medical History:  Diagnosis Date  . Allergy   . Anxiety   . Diverticulitis   . OCD (obsessive compulsive disorder)   . Tear of left glenoid labrum, posterior 05/13/2016    Past Surgical History:  Procedure Laterality Date  . COLONOSCOPY    . PARTIAL COLECTOMY  2009   for diverticulitis  . SHOULDER ARTHROSCOPY WITH BANKART REPAIR Left 05/13/2016   Procedure: SHOULDER ARTHROSCOPY WITH POSTERIOR BANKART REPAIR with extensive debridement, left bursectomy;  Surgeon: Marchia Bond, MD;  Location: [place];  Service: Orthopedics;  Laterality: Left;  pre/Post Op Scalene block  . WISDOM TOOTH EXTRACTION  1988    There were no vitals filed for this visit.  Subjective Assessment - 07/19/17 1020    Subjective  Patient reports less pain but he is doing less activity. He is still having difficulty sleeping.     Limitations  Standing    Patient Stated Goals  to have less pain in his shoulder     Currently in Pain?  No/denies                      [facility] Adult PT Treatment/Exercise - 07/19/17 0001      Shoulder Exercises: Supine   Protraction  Both;Limitations    Protraction Limitations  2x10 2lb     Other Supine Exercises  supine d2 flexion2lb supine flexion mid range 2x10  2lb       Shoulder Exercises: Prone   Other Prone Exercises   ; horizontal abduction 2x10 2lb; Prone row 2x10 5 lbs       Shoulder Exercises: Sidelying   Other Sidelying Exercises  side lying ER 2x10 3lb       Shoulder Exercises: Standing   Other Standing Exercises  wall clock green 5x each; wall walk 5x each direction 3 laps green; standing shoulder flexion 3lb 2x10 Y 2x10 in the mirror; Standing wall flexion with retraction 2x10       Shoulder Exercises: Power Warden/ranger Exercises  shoulder row both handles 35 2x10; lat pulldwon 2x10 25lbs       Manual Therapy   Manual Therapy  Scapular mobilization    Manual therapy comments  PROM flexion, ER, IR; Gentle grade II and III PA mobs for IR     Scapular Mobilization  with seated flexion             PT Education - 07/19/17 1022    Education provided  Yes    Person(s) Educated  Patient    Methods  Explanation;Demonstration;Tactile cues;Verbal cues    Comprehension  Verbalized understanding;Returned demonstration;Verbal  cues required;Tactile cues required       PT Short Term Goals - 07/08/17 0810      PT SHORT TERM GOAL #1   Title  Patient will deonstrate 5/5 gross left shoulder strength     Baseline  55 degrees     Time  4    Period  Weeks    Status  On-going      PT SHORT TERM GOAL #2   Title  Patient will be indepdnent with basic strengthening program     Baseline  4/10-7/10    Time  4    Period  Weeks    Status  On-going      PT SHORT TERM GOAL #3   Title  Patient will demsotrate full passive ROM     Baseline  135    Time  4    Period  Weeks    Status  On-going      PT SHORT TERM GOAL #4   Title  Patient will be independent with initial HEP     Baseline  independent    Time  4    Period  Weeks    Status  On-going      PT SHORT TERM GOAL #5   Title  Patient will demsotrate 4/5 gross left shoulder strength in available range     Baseline  not tested     Time  4    Period  Weeks     Status  On-going        PT Long Term Goals - 07/07/17 1445      PT LONG TERM GOAL #1   Title  Patient will reach behind back to t-8 without pain in order to perfrom ADL's     Baseline  L-1 with pain    Time  8    Period  Weeks    Status  On-going      PT LONG TERM GOAL #2   Title  Patient will reach overhead into a cabinet without pain in order to perfrom IADL's     Baseline  can reach overhead with pain    Time  8    Period  Weeks    Status  On-going      PT LONG TERM GOAL #3   Title  Patient will sleep through the night without pain on the left side     Baseline  recent difficulty sleeping    Time  8    Period  Weeks    Status  On-going      PT LONG TERM GOAL #4   Title  Patient will demsotrate a 24% limitation on FOTO     Time  8    Period  Weeks    Status  On-going      PT LONG TERM GOAL #5   Title  Patient will be independent with advanced exercise program.     Time  8    Period  Weeks    Status  On-going            Plan - 07/19/17 1041    Clinical Impression Statement  Patients pain has improved since we reduced his weights and increased his reps. He had end range flexion and ER pain in his posterior shoulder today. Therapy will slowly advance his execises as tolerated     Clinical Presentation  Evolving    Clinical Decision Making  Low    PT Frequency  2x / week  PT Duration  8 weeks    PT Treatment/Interventions  ADLs/Self Care Home Management;Cryotherapy;Electrical Stimulation;Iontophoresis 4mg /ml Dexamethasone;Traction;Ultrasound;Therapeutic exercise;Therapeutic activities;Neuromuscular re-education;Patient/family education;Taping;Vasopneumatic Device    PT Next Visit Plan  Mnaul therapy to reduce impingement; Scapualr stability exercise; Consider quadruped alternating UE/LE; review ER/IR; add prone series; consdier scapualr clock; mulligan IR stretch      PT Home Exercise Plan  shoulder extension 2x10 green; Scap retraction; shoulder IR/ ER; side  lying IR sleeper stretch; row machine; pull down machine; scapular clock     Consulted and Agree with Plan of Care  Patient       Patient will benefit from skilled therapeutic intervention in order to improve the following deficits and impairments:  Pain, Postural dysfunction, Decreased strength, Decreased mobility, Decreased activity tolerance, Impaired UE functional use, Decreased range of motion  Visit Diagnosis: Stiffness of left shoulder, not elsewhere classified  Acute pain of left shoulder  Muscle weakness (generalized)  Localized edema     Problem List Patient Active Problem List   Diagnosis Date Noted  . Wellness examination 03/01/2017  . Low testosterone 02/22/2017  . Tear of left glenoid labrum, posterior 05/13/2016  . Fever 01/20/2011  . PLANTAR FASCIITIS, LEFT 05/04/2010  . HEEL PAIN 05/04/2010  . HALLUX RIGIDUS 05/04/2010    Carney Living PT DPT  07/19/2017, 9:01 PM  [facility] [address] [place], [name], [address] Phone: [phone]   Fax:  [phone]  Name: [name] [name] MRN: [id number] Date of Birth: 03-03-1966

## 2017-07-21 ENCOUNTER — Ambulatory Visit: Payer: 59 | Admitting: Physical Therapy

## 2017-07-26 ENCOUNTER — Ambulatory Visit: Payer: 59 | Admitting: Physical Therapy

## 2017-07-26 MED FILL — clonazePAM 1 MG TABS: 1 | 30 days supply | Qty: 60 | Fill #4

## 2017-07-28 ENCOUNTER — Ambulatory Visit: Payer: 59 | Admitting: Physical Therapy

## 2017-07-28 ENCOUNTER — Encounter: Payer: Self-pay | Admitting: Physical Therapy

## 2017-07-28 DIAGNOSIS — M25512 Pain in left shoulder: Secondary | ICD-10-CM | POA: Diagnosis not present

## 2017-07-28 DIAGNOSIS — M6281 Muscle weakness (generalized): Secondary | ICD-10-CM

## 2017-07-28 DIAGNOSIS — R600 Localized edema: Secondary | ICD-10-CM

## 2017-07-28 DIAGNOSIS — M25612 Stiffness of left shoulder, not elsewhere classified: Secondary | ICD-10-CM

## 2017-07-28 NOTE — Therapy (Signed)
[place], [name], [address] Phone: [phone]   Fax:  [phone]  Physical Therapy Treatment  Patient Details  Name: [name] [name] MRN: [id number] Date of Birth: 09/18/1965 Referring Provider: Dr Carter Kitten    Encounter Date: 07/28/2017  PT End of Session - 07/28/17 1130    Visit Number  13    Number of Visits  16    Date for PT Re-Evaluation  07/21/17    Authorization Type  [employer] Save     PT Start Time  1103    PT Stop Time  1145    PT Time Calculation (min)  42 min    Activity Tolerance  Patient tolerated treatment well    Behavior During Therapy  [facility] for tasks assessed/performed       Past Medical History:  Diagnosis Date  . Allergy   . Anxiety   . Diverticulitis   . OCD (obsessive compulsive disorder)   . Tear of left glenoid labrum, posterior 05/13/2016    Past Surgical History:  Procedure Laterality Date  . COLONOSCOPY    . PARTIAL COLECTOMY  2009   for diverticulitis  . SHOULDER ARTHROSCOPY WITH BANKART REPAIR Left 05/13/2016   Procedure: SHOULDER ARTHROSCOPY WITH POSTERIOR BANKART REPAIR with extensive debridement, left bursectomy;  Surgeon: Marchia Bond, MD;  Location: [place];  Service: Orthopedics;  Laterality: Left;  pre/Post Op Scalene block  . WISDOM TOOTH EXTRACTION  1988    There were no vitals filed for this visit.  Subjective Assessment - 07/28/17 1107    Subjective  Patient reports he has not been sable to do nuch exercising. His pain is getting better and he is     Limitations  Standing    Patient Stated Goals  to have less pain in his shoulder     Currently in Pain?  No/denies                      [facility] Adult PT Treatment/Exercise - 07/28/17 0001      Shoulder Exercises: Supine   Protraction  Both;Limitations    Protraction Limitations  2x10 2lb     Other Supine Exercises  supine d2 flexion2lb supine flexion mid range  2x10 2lb       Shoulder Exercises: Prone   Other Prone Exercises   ; horizontal abduction 2x10 2lb; Prone row 2x10 5 lbs       Shoulder Exercises: Sidelying   Other Sidelying Exercises  side lying ER 2x10 3lb       Shoulder Exercises: Standing   Other Standing Exercises  wall clock green 5x each; wall walk 5x each direction 3 laps green; standing shoulder flexion 3lb 2x10 Y 2x10 in the mirror; Standing wall flexion with retraction 2x10       Shoulder Exercises: Power Warden/ranger Exercises  shoulder row both handles 35 2x10; lat pulldwon 2x10 25lbs       Manual Therapy   Manual Therapy  Scapular mobilization    Manual therapy comments  PROM flexion, ER, IR; Gentle grade II and III PA mobs for IR     Scapular Mobilization  with seated flexion             PT Education - 07/28/17 1128    Education provided  Yes    Education Details  reviewed there-ex and stretching    Person(s) Educated  Patient  Methods  Explanation;Demonstration;Verbal cues;Tactile cues    Comprehension  Verbalized understanding;Returned demonstration;Verbal cues required;Tactile cues required       PT Short Term Goals - 07/28/17 1631      PT SHORT TERM GOAL #1   Title  Patient will deonstrate 5/5 gross left shoulder strength     Baseline  55 degrees     Time  4    Period  Weeks    Status  On-going      PT SHORT TERM GOAL #2   Title  Patient will be indepdnent with basic strengthening program     Baseline  4/10-7/10    Time  4    Period  Weeks    Status  On-going      PT SHORT TERM GOAL #3   Title  Patient will demsotrate full passive ROM     Baseline  135    Time  4    Period  Weeks    Status  On-going      PT SHORT TERM GOAL #4   Title  Patient will be independent with initial HEP     Baseline  independent    Time  4    Period  Weeks    Status  On-going      PT SHORT TERM GOAL #5   Title  Patient will demsotrate 4/5 gross left shoulder strength in available range      Baseline  not tested     Time  4    Period  Weeks    Status  On-going        PT Long Term Goals - 07/07/17 1445      PT LONG TERM GOAL #1   Title  Patient will reach behind back to t-8 without pain in order to perfrom ADL's     Baseline  L-1 with pain    Time  8    Period  Weeks    Status  On-going      PT LONG TERM GOAL #2   Title  Patient will reach overhead into a cabinet without pain in order to perfrom IADL's     Baseline  can reach overhead with pain    Time  8    Period  Weeks    Status  On-going      PT LONG TERM GOAL #3   Title  Patient will sleep through the night without pain on the left side     Baseline  recent difficulty sleeping    Time  8    Period  Weeks    Status  On-going      PT LONG TERM GOAL #4   Title  Patient will demsotrate a 24% limitation on FOTO     Time  8    Period  Weeks    Status  On-going      PT LONG TERM GOAL #5   Title  Patient will be independent with advanced exercise program.     Time  8    Period  Weeks    Status  On-going            Plan - 07/28/17 1138    Clinical Impression Statement  Patientsrange remains near nend range. Therapy contiinues to focus on mid level strengthening. He was advised to continue with light exercises. Therapy will continue to work on getting him and HEP that does not increase his pain. Likley D/C after the next 2 visits.  Clinical Presentation  Evolving    Clinical Decision Making  Low    Rehab Potential  Good    PT Frequency  2x / week    PT Duration  8 weeks    PT Treatment/Interventions  ADLs/Self Care Home Management;Cryotherapy;Electrical Stimulation;Iontophoresis 4mg /ml Dexamethasone;Traction;Ultrasound;Therapeutic exercise;Therapeutic activities;Neuromuscular re-education;Patient/family education;Taping;Vasopneumatic Device    PT Next Visit Plan  Manul therapy to reduce impingement; Scapualr stability exercise; Consider quadruped alternating UE/LE; review ER/IR; add prone series;  consdier scapualr clock; mulligan IR stretch      PT Home Exercise Plan  shoulder extension 2x10 green; Scap retraction; shoulder IR/ ER; side lying IR sleeper stretch; row machine; pull down machine; scapular clock     Consulted and Agree with Plan of Care  Patient       Patient will benefit from skilled therapeutic intervention in order to improve the following deficits and impairments:  Pain, Postural dysfunction, Decreased strength, Decreased mobility, Decreased activity tolerance, Impaired UE functional use, Decreased range of motion  Visit Diagnosis: Stiffness of left shoulder, not elsewhere classified  Acute pain of left shoulder  Muscle weakness (generalized)  Localized edema     Problem List Patient Active Problem List   Diagnosis Date Noted  . Wellness examination 03/01/2017  . Low testosterone 02/22/2017  . Tear of left glenoid labrum, posterior 05/13/2016  . Fever 01/20/2011  . PLANTAR FASCIITIS, LEFT 05/04/2010  . HEEL PAIN 05/04/2010  . HALLUX RIGIDUS 05/04/2010    Carney Living 07/28/2017, 4:33 PM  [facility] [address] [place], [name], [address] Phone: [phone]   Fax:  [phone]  Name: [name] [name] MRN: [id number] Date of Birth: 1966/04/16

## 2017-08-02 ENCOUNTER — Encounter: Payer: 59 | Admitting: Physical Therapy

## 2017-08-04 ENCOUNTER — Ambulatory Visit: Payer: 59 | Admitting: Physical Therapy

## 2017-08-05 ENCOUNTER — Ambulatory Visit: Payer: 59 | Admitting: Physical Therapy

## 2017-08-05 DIAGNOSIS — M25612 Stiffness of left shoulder, not elsewhere classified: Secondary | ICD-10-CM | POA: Diagnosis not present

## 2017-08-05 DIAGNOSIS — R600 Localized edema: Secondary | ICD-10-CM

## 2017-08-05 DIAGNOSIS — M25512 Pain in left shoulder: Secondary | ICD-10-CM

## 2017-08-05 DIAGNOSIS — M6281 Muscle weakness (generalized): Secondary | ICD-10-CM

## 2017-08-08 ENCOUNTER — Other Ambulatory Visit: Payer: Self-pay | Admitting: Endocrinology

## 2017-08-08 NOTE — Therapy (Signed)
[place], [name], [address] Phone: [phone]   Fax:  [phone]  Physical Therapy Treatment  Patient Details  Name: [name] [name] MRN: [id number] Date of Birth: [birth year] Referring Provider: Dr Carter Kitten    Encounter Date: 08/05/2017  PT End of Session - 08/08/17 0856    Visit Number  14    Number of Visits  16    Date for PT Re-Evaluation  07/21/17    Authorization Type  [facility] [employer] Save     PT Start Time  0930    PT Stop Time  1010    PT Time Calculation (min)  40 min    Activity Tolerance  Patient tolerated treatment well    Behavior During Therapy  [facility] for tasks assessed/performed       Past Medical History:  Diagnosis Date  . Allergy   . Anxiety   . Diverticulitis   . OCD (obsessive compulsive disorder)   . Tear of left glenoid labrum, posterior 05/13/2016    Past Surgical History:  Procedure Laterality Date  . COLONOSCOPY    . PARTIAL COLECTOMY  2009   for diverticulitis  . SHOULDER ARTHROSCOPY WITH BANKART REPAIR Left 05/13/2016   Procedure: SHOULDER ARTHROSCOPY WITH POSTERIOR BANKART REPAIR with extensive debridement, left bursectomy;  Surgeon: Marchia Bond, MD;  Location: [place];  Service: Orthopedics;  Laterality: Left;  pre/Post Op Scalene block  . WISDOM TOOTH EXTRACTION  1988    There were no vitals filed for this visit.  Subjective Assessment - 08/08/17 0827    Subjective  The patient has not been able to do his exercises as much over the last week. His shoulder is not hurting as much. He also changed his matress and feels like he is sleeping better. He is still having pain when he lies on it.     Limitations  Standing    Patient Stated Goals  to have less pain in his shoulder     Currently in Pain?  No/denies                                PT Short Term Goals - 08/08/17 0903      PT SHORT TERM GOAL #1   Title  Patient  will deonstrate 5/5 gross left shoulder strength     Baseline  5/5    Time  4    Period  Weeks    Status  Achieved      PT SHORT TERM GOAL #2   Title  Patient will be indepdnent with basic strengthening program     Baseline  Independent with program     Time  4    Period  Weeks    Status  Achieved      PT SHORT TERM GOAL #3   Title  Patient will demontrate full passive ROM     Time  4    Period  Weeks    Status  Achieved      PT SHORT TERM GOAL #4   Title  Patient will be independent with initial HEP     Baseline  independent    Time  4    Period  Weeks    Status  Achieved      PT SHORT TERM GOAL #5   Title  Patient will demsotrate 4/5 gross left shoulder strength in available range  PT Long Term Goals - 08/08/17 0904      PT LONG TERM GOAL #1   Title  Patient will reach behind back to t-8 without pain in order to perfrom ADL's     Baseline  L3     Time  8    Period  Weeks    Status  On-going      PT LONG TERM GOAL #2   Title  Patient will reach overhead into a cabinet without pain in order to perfrom IADL's     Baseline  can reach overhead with pain    Time  8    Period  Weeks    Status  On-going      PT LONG TERM GOAL #3   Title  Patient will sleep through the night without pain on the left side     Baseline  recent difficulty sleeping    Time  8    Period  Weeks    Status  Not Met      PT LONG TERM GOAL #4   Title  Patient will demsotrate a 24% limitation on FOTO     Baseline  No real improvement     Time  8    Period  Weeks    Status  Not Met      PT LONG TERM GOAL #5   Title  Patient will be independent with advanced exercise program.     Time  8    Period  Weeks    Status  Not Met         PHYSICAL THERAPY DISCHARGE SUMMARY  Visits from Start of Care: **14  Current functional level related to goals / functional outcomes: Improved range; has an HEP    Remaining deficits: Pain when lying on his shoulder   Education /  Equipment: HEP  Plan: Patient agrees to discharge.  Patient goals were not met. Patient is being discharged due to meeting the stated rehab goals.  ?????        Plan - 08/08/17 0900    Clinical Impression Statement  Patient has reached max potential for PT. Therapy tried at a few points to progress his exercises but he had increased pain. he was advised to continue with his HEP. he was given lighter exercises to continue to focus on. He has full range.     Clinical Presentation  Evolving    Clinical Decision Making  Low    Rehab Potential  Good    PT Frequency  2x / week    PT Duration  8 weeks    PT Treatment/Interventions  ADLs/Self Care Home Management;Cryotherapy;Electrical Stimulation;Iontophoresis 65m/ml Dexamethasone;Traction;Ultrasound;Therapeutic exercise;Therapeutic activities;Neuromuscular re-education;Patient/family education;Taping;Vasopneumatic Device    PT Next Visit Plan  Manul therapy to reduce impingement; Scapualr stability exercise; Consider quadruped alternating UE/LE; review ER/IR; add prone series; consdier scapualr clock; mulligan IR stretch      PT Home Exercise Plan  shoulder extension 2x10 green; Scap retraction; shoulder IR/ ER; side lying IR sleeper stretch; row machine; pull down machine; scapular clock     Consulted and Agree with Plan of Care  Patient       Patient will benefit from skilled therapeutic intervention in order to improve the following deficits and impairments:  Pain, Postural dysfunction, Decreased strength, Decreased mobility, Decreased activity tolerance, Impaired UE functional use, Decreased range of motion  Visit Diagnosis: Stiffness of left shoulder, not elsewhere classified  Acute pain of left shoulder  Muscle weakness (generalized)  Localized  edema     Problem List Patient Active Problem List   Diagnosis Date Noted  . Wellness examination 03/01/2017  . Low testosterone 02/22/2017  . Tear of left glenoid labrum, posterior  05/13/2016  . Fever 01/20/2011  . PLANTAR FASCIITIS, LEFT 05/04/2010  . HEEL PAIN 05/04/2010  . HALLUX RIGIDUS 05/04/2010    Carney Living PT DPT  08/08/2017, 9:08 AM  [facility] [address] [place], [name], [address] Phone: [phone]   Fax:  [phone]  Name: Ishmel Acevedo MRN: [id number] Date of Birth: 13-Sep-1965

## 2017-08-29 MED FILL — CLOMIPHENE CITRATE 50 MG TA: 50 | 90 days supply | Qty: 10 | Fill #2

## 2017-08-29 MED FILL — clonazePAM 1 MG TABS: 1 | 30 days supply | Qty: 60 | Fill #0

## 2017-08-30 DIAGNOSIS — F411 Generalized anxiety disorder: Secondary | ICD-10-CM | POA: Diagnosis not present

## 2017-09-27 DIAGNOSIS — F411 Generalized anxiety disorder: Secondary | ICD-10-CM | POA: Diagnosis not present

## 2017-10-06 MED FILL — clonazePAM 1 MG TABS: 1 | 30 days supply | Qty: 60 | Fill #1

## 2017-10-11 DIAGNOSIS — F411 Generalized anxiety disorder: Secondary | ICD-10-CM | POA: Diagnosis not present

## 2017-10-21 DIAGNOSIS — S43432A Superior glenoid labrum lesion of left shoulder, initial encounter: Secondary | ICD-10-CM | POA: Diagnosis not present

## 2017-11-02 MED FILL — MUPIROCIN 2% OINTMENT: 2 | 20 days supply | Qty: 44 | Fill #0

## 2017-11-08 DIAGNOSIS — F411 Generalized anxiety disorder: Secondary | ICD-10-CM | POA: Diagnosis not present

## 2017-11-09 MED FILL — clonazePAM 1 MG TABS: 1 | 30 days supply | Qty: 60 | Fill #0

## 2017-11-18 DIAGNOSIS — F411 Generalized anxiety disorder: Secondary | ICD-10-CM | POA: Diagnosis not present

## 2017-11-24 DIAGNOSIS — F411 Generalized anxiety disorder: Secondary | ICD-10-CM | POA: Diagnosis not present

## 2017-11-29 MED FILL — CLOMIPHENE CITRATE 50 MG TA: 50 | 90 days supply | Qty: 10 | Fill #3

## 2017-12-07 MED FILL — clonazePAM 1 MG TABS: 1 | 30 days supply | Qty: 60 | Fill #1

## 2017-12-08 DIAGNOSIS — F411 Generalized anxiety disorder: Secondary | ICD-10-CM | POA: Diagnosis not present

## 2017-12-15 DIAGNOSIS — F4320 Adjustment disorder, unspecified: Secondary | ICD-10-CM | POA: Diagnosis not present

## 2017-12-20 DIAGNOSIS — Z1389 Encounter for screening for other disorder: Secondary | ICD-10-CM | POA: Diagnosis not present

## 2017-12-20 DIAGNOSIS — F411 Generalized anxiety disorder: Secondary | ICD-10-CM | POA: Diagnosis not present

## 2017-12-20 DIAGNOSIS — R82998 Other abnormal findings in urine: Secondary | ICD-10-CM | POA: Diagnosis not present

## 2017-12-20 DIAGNOSIS — Z6821 Body mass index (BMI) 21.0-21.9, adult: Secondary | ICD-10-CM | POA: Diagnosis not present

## 2017-12-20 DIAGNOSIS — F418 Other specified anxiety disorders: Secondary | ICD-10-CM | POA: Diagnosis not present

## 2017-12-20 DIAGNOSIS — E291 Testicular hypofunction: Secondary | ICD-10-CM | POA: Diagnosis not present

## 2017-12-20 DIAGNOSIS — Z0000 Encounter for general adult medical examination without abnormal findings: Secondary | ICD-10-CM | POA: Diagnosis not present

## 2017-12-20 DIAGNOSIS — Z23 Encounter for immunization: Secondary | ICD-10-CM | POA: Diagnosis not present

## 2017-12-20 DIAGNOSIS — M722 Plantar fascial fibromatosis: Secondary | ICD-10-CM | POA: Diagnosis not present

## 2017-12-22 DIAGNOSIS — Z0000 Encounter for general adult medical examination without abnormal findings: Secondary | ICD-10-CM | POA: Diagnosis not present

## 2018-01-03 DIAGNOSIS — F4322 Adjustment disorder with anxiety: Secondary | ICD-10-CM | POA: Diagnosis not present

## 2018-01-05 DIAGNOSIS — F411 Generalized anxiety disorder: Secondary | ICD-10-CM | POA: Diagnosis not present

## 2018-01-09 DIAGNOSIS — F4322 Adjustment disorder with anxiety: Secondary | ICD-10-CM | POA: Diagnosis not present

## 2018-01-09 MED FILL — clonazePAM 1 MG TABS: 1 | 30 days supply | Qty: 60 | Fill #0

## 2018-01-23 DIAGNOSIS — F4322 Adjustment disorder with anxiety: Secondary | ICD-10-CM | POA: Diagnosis not present

## 2018-01-25 DIAGNOSIS — F4320 Adjustment disorder, unspecified: Secondary | ICD-10-CM | POA: Diagnosis not present

## 2018-01-30 DIAGNOSIS — F4322 Adjustment disorder with anxiety: Secondary | ICD-10-CM | POA: Diagnosis not present

## 2018-02-01 DIAGNOSIS — F411 Generalized anxiety disorder: Secondary | ICD-10-CM | POA: Diagnosis not present

## 2018-02-02 DIAGNOSIS — F4322 Adjustment disorder with anxiety: Secondary | ICD-10-CM | POA: Diagnosis not present

## 2018-02-06 DIAGNOSIS — F4322 Adjustment disorder with anxiety: Secondary | ICD-10-CM | POA: Diagnosis not present

## 2018-02-08 DIAGNOSIS — F411 Generalized anxiety disorder: Secondary | ICD-10-CM | POA: Diagnosis not present

## 2018-02-09 MED FILL — clonazePAM 1 MG TABS: 1 | 30 days supply | Qty: 60 | Fill #1

## 2018-03-01 DIAGNOSIS — F4322 Adjustment disorder with anxiety: Secondary | ICD-10-CM | POA: Diagnosis not present

## 2018-03-03 ENCOUNTER — Other Ambulatory Visit: Payer: Self-pay | Admitting: Endocrinology

## 2018-03-09 DIAGNOSIS — F4322 Adjustment disorder with anxiety: Secondary | ICD-10-CM | POA: Diagnosis not present

## 2018-03-10 MED FILL — clonazePAM 1 MG TABS: 1 | 30 days supply | Qty: 60 | Fill #2

## 2018-03-21 ENCOUNTER — Other Ambulatory Visit: Payer: Self-pay | Admitting: Endocrinology

## 2018-03-21 DIAGNOSIS — F4322 Adjustment disorder with anxiety: Secondary | ICD-10-CM | POA: Diagnosis not present

## 2018-03-21 MED ORDER — CLOMIPHENE CITRATE 50 MG PO TABS: ORAL_TABLET | ORAL | 5 refills | Status: DC

## 2018-03-22 DIAGNOSIS — F411 Generalized anxiety disorder: Secondary | ICD-10-CM | POA: Diagnosis not present

## 2018-03-23 MED FILL — CLOMIPHENE CITRATE 50 MG TA: 50 | 84 days supply | Qty: 9 | Fill #0

## 2018-04-05 DIAGNOSIS — F4322 Adjustment disorder with anxiety: Secondary | ICD-10-CM | POA: Diagnosis not present

## 2018-04-07 MED FILL — clonazePAM 1 MG TABS: 1 | 30 days supply | Qty: 60 | Fill #3

## 2018-04-10 DIAGNOSIS — F4322 Adjustment disorder with anxiety: Secondary | ICD-10-CM | POA: Diagnosis not present

## 2018-04-11 ENCOUNTER — Other Ambulatory Visit: Payer: Self-pay | Admitting: Orthopedic Surgery

## 2018-04-19 DIAGNOSIS — F4322 Adjustment disorder with anxiety: Secondary | ICD-10-CM | POA: Diagnosis not present

## 2018-05-02 ENCOUNTER — Other Ambulatory Visit: Payer: Self-pay | Admitting: Orthopedic Surgery

## 2018-05-03 ENCOUNTER — Encounter (HOSPITAL_BASED_OUTPATIENT_CLINIC_OR_DEPARTMENT_OTHER): Payer: Self-pay | Admitting: *Deleted

## 2018-05-03 ENCOUNTER — Other Ambulatory Visit: Payer: Self-pay

## 2018-05-06 MED FILL — clonazePAM 1 MG TABS: 1 | 30 days supply | Qty: 60 | Fill #4

## 2018-05-08 DIAGNOSIS — F4322 Adjustment disorder with anxiety: Secondary | ICD-10-CM | POA: Diagnosis not present

## 2018-05-11 ENCOUNTER — Ambulatory Visit (HOSPITAL_BASED_OUTPATIENT_CLINIC_OR_DEPARTMENT_OTHER)
Admission: RE | Admit: 2018-05-11 | Discharge: 2018-05-11 | Disposition: A | Discharge status: Home / Self Care | Payer: 59 | Source: Ambulatory Visit | Attending: Orthopedic Surgery | Admitting: Orthopedic Surgery

## 2018-05-11 ENCOUNTER — Other Ambulatory Visit: Payer: Self-pay

## 2018-05-11 ENCOUNTER — Ambulatory Visit (HOSPITAL_BASED_OUTPATIENT_CLINIC_OR_DEPARTMENT_OTHER): Payer: 59 | Admitting: Certified Registered"

## 2018-05-11 ENCOUNTER — Encounter (HOSPITAL_BASED_OUTPATIENT_CLINIC_OR_DEPARTMENT_OTHER): Payer: Self-pay

## 2018-05-11 ENCOUNTER — Encounter (HOSPITAL_BASED_OUTPATIENT_CLINIC_OR_DEPARTMENT_OTHER)
Admission: RE | Disposition: A | Discharge status: Home / Self Care | Payer: Self-pay | Source: Ambulatory Visit | Attending: Orthopedic Surgery

## 2018-05-11 DIAGNOSIS — M7582 Other shoulder lesions, left shoulder: Secondary | ICD-10-CM | POA: Diagnosis not present

## 2018-05-11 DIAGNOSIS — M65812 Other synovitis and tenosynovitis, left shoulder: Secondary | ICD-10-CM | POA: Diagnosis not present

## 2018-05-11 DIAGNOSIS — M7542 Impingement syndrome of left shoulder: Secondary | ICD-10-CM | POA: Diagnosis not present

## 2018-05-11 DIAGNOSIS — Z9049 Acquired absence of other specified parts of digestive tract: Secondary | ICD-10-CM | POA: Insufficient documentation

## 2018-05-11 DIAGNOSIS — Z885 Allergy status to narcotic agent status: Secondary | ICD-10-CM | POA: Diagnosis not present

## 2018-05-11 DIAGNOSIS — Z79899 Other long term (current) drug therapy: Secondary | ICD-10-CM | POA: Diagnosis not present

## 2018-05-11 DIAGNOSIS — G8918 Other acute postprocedural pain: Secondary | ICD-10-CM | POA: Diagnosis not present

## 2018-05-11 DIAGNOSIS — M7552 Bursitis of left shoulder: Secondary | ICD-10-CM | POA: Insufficient documentation

## 2018-05-11 DIAGNOSIS — X58XXXA Exposure to other specified factors, initial encounter: Secondary | ICD-10-CM | POA: Diagnosis not present

## 2018-05-11 DIAGNOSIS — M7522 Bicipital tendinitis, left shoulder: Secondary | ICD-10-CM | POA: Diagnosis not present

## 2018-05-11 DIAGNOSIS — F419 Anxiety disorder, unspecified: Secondary | ICD-10-CM | POA: Insufficient documentation

## 2018-05-11 DIAGNOSIS — S43432A Superior glenoid labrum lesion of left shoulder, initial encounter: Secondary | ICD-10-CM | POA: Diagnosis not present

## 2018-05-11 DIAGNOSIS — M75102 Unspecified rotator cuff tear or rupture of left shoulder, not specified as traumatic: Secondary | ICD-10-CM | POA: Diagnosis not present

## 2018-05-11 HISTORY — PX: BICEPT TENODESIS: SHX5116

## 2018-05-11 HISTORY — PX: SHOULDER ARTHROSCOPY WITH SUBACROMIAL DECOMPRESSION: SHX5684

## 2018-05-11 HISTORY — PX: SHOULDER ARTHROSCOPY WITH LABRAL REPAIR: SHX5691

## 2018-05-11 SURGERY — ARTHROSCOPY, SHOULDER, WITH GLENOID LABRUM REPAIR
Anesthesia: Regional | Site: Shoulder | Laterality: Left

## 2018-05-11 MED ORDER — ONDANSETRON HCL 4 MG/2ML IJ SOLN
INTRAMUSCULAR | Status: AC
  Filled 2018-05-11: qty 2

## 2018-05-11 MED ORDER — ONDANSETRON HCL 4 MG PO TABS: 4.0000 mg | ORAL_TABLET | Freq: Three times a day (TID) | ORAL | 1 refills | Status: DC | PRN

## 2018-05-11 MED ORDER — CEFAZOLIN SODIUM-DEXTROSE 2-4 GM/100ML-% IV SOLN: 2.0000 g | Freq: Once | INTRAVENOUS | Status: DC

## 2018-05-11 MED ORDER — LACTATED RINGERS IV SOLN
INTRAVENOUS | Status: DC
  Administered 2018-05-11: 12:00:00 via INTRAVENOUS

## 2018-05-11 MED ORDER — ONDANSETRON HCL 4 MG/2ML IJ SOLN
INTRAMUSCULAR | Status: DC | PRN
  Administered 2018-05-11: 4 mg via INTRAVENOUS

## 2018-05-11 MED ORDER — MIDAZOLAM HCL 2 MG/2ML IJ SOLN
1.0000 mg | INTRAMUSCULAR | Status: DC | PRN
  Administered 2018-05-11: 2 mg via INTRAVENOUS

## 2018-05-11 MED ORDER — LIDOCAINE 2% (20 MG/ML) 5 ML SYRINGE
INTRAMUSCULAR | Status: DC | PRN
  Administered 2018-05-11: 50 mg via INTRAVENOUS

## 2018-05-11 MED ORDER — CEFAZOLIN SODIUM-DEXTROSE 2-4 GM/100ML-% IV SOLN
2.0000 g | INTRAVENOUS | Status: AC
  Administered 2018-05-11: 2 g via INTRAVENOUS

## 2018-05-11 MED ORDER — MIDAZOLAM HCL 2 MG/2ML IJ SOLN
INTRAMUSCULAR | Status: AC
  Filled 2018-05-11: qty 2

## 2018-05-11 MED ORDER — FENTANYL CITRATE (PF) 100 MCG/2ML IJ SOLN: 25.0000 ug | INTRAMUSCULAR | Status: DC | PRN

## 2018-05-11 MED ORDER — DEXAMETHASONE SODIUM PHOSPHATE 4 MG/ML IJ SOLN
INTRAMUSCULAR | Status: DC | PRN
  Administered 2018-05-11: 10 mg via INTRAVENOUS

## 2018-05-11 MED ORDER — LIDOCAINE 2% (20 MG/ML) 5 ML SYRINGE
INTRAMUSCULAR | Status: AC
  Filled 2018-05-11: qty 5

## 2018-05-11 MED ORDER — VANCOMYCIN HCL IN DEXTROSE 1-5 GM/200ML-% IV SOLN
1000.0000 mg | Freq: Once | INTRAVENOUS | Status: AC
  Administered 2018-05-11: 1000 mg via INTRAVENOUS

## 2018-05-11 MED ORDER — SODIUM CHLORIDE 0.9 % IR SOLN
Status: DC | PRN
  Administered 2018-05-11: 9000 mL

## 2018-05-11 MED ORDER — PROPOFOL 10 MG/ML IV BOLUS
INTRAVENOUS | Status: DC | PRN
  Administered 2018-05-11: 200 mg via INTRAVENOUS

## 2018-05-11 MED ORDER — FENTANYL CITRATE (PF) 100 MCG/2ML IJ SOLN
50.0000 ug | INTRAMUSCULAR | Status: DC | PRN
  Administered 2018-05-11: 100 ug via INTRAVENOUS

## 2018-05-11 MED ORDER — VANCOMYCIN HCL IN DEXTROSE 1-5 GM/200ML-% IV SOLN
INTRAVENOUS | Status: AC
  Filled 2018-05-11: qty 200

## 2018-05-11 MED ORDER — CEFAZOLIN SODIUM-DEXTROSE 2-4 GM/100ML-% IV SOLN
INTRAVENOUS | Status: AC
  Filled 2018-05-11: qty 100

## 2018-05-11 MED ORDER — FENTANYL CITRATE (PF) 100 MCG/2ML IJ SOLN
INTRAMUSCULAR | Status: AC
  Filled 2018-05-11: qty 2

## 2018-05-11 MED ORDER — OXYCODONE HCL 5 MG PO TABS: 5.0000 mg | ORAL_TABLET | ORAL | 0 refills | Status: DC | PRN

## 2018-05-11 MED ORDER — MEPERIDINE HCL 25 MG/ML IJ SOLN: 6.2500 mg | INTRAMUSCULAR | Status: DC | PRN

## 2018-05-11 MED ORDER — ONDANSETRON HCL 4 MG/2ML IJ SOLN: 4.0000 mg | Freq: Once | INTRAMUSCULAR | Status: DC | PRN

## 2018-05-11 MED ORDER — POVIDONE-IODINE 7.5 % EX SOLN: Freq: Once | CUTANEOUS | Status: DC

## 2018-05-11 MED ORDER — BUPIVACAINE LIPOSOME 1.3 % IJ SUSP
INTRAMUSCULAR | Status: DC | PRN
  Administered 2018-05-11: 10 mL via PERINEURAL

## 2018-05-11 MED ORDER — DEXAMETHASONE SODIUM PHOSPHATE 10 MG/ML IJ SOLN
INTRAMUSCULAR | Status: AC
  Filled 2018-05-11: qty 1

## 2018-05-11 MED ORDER — BUPIVACAINE-EPINEPHRINE (PF) 0.5% -1:200000 IJ SOLN
INTRAMUSCULAR | Status: DC | PRN
  Administered 2018-05-11: 15 mL via PERINEURAL

## 2018-05-11 MED ORDER — SCOPOLAMINE 1 MG/3DAYS TD PT72: 1.0000 | MEDICATED_PATCH | Freq: Once | TRANSDERMAL | Status: DC | PRN

## 2018-05-11 MED FILL — ONDANSETRON HCL 4 MG TABLET: 4 | 10 days supply | Qty: 30 | Fill #0

## 2018-05-11 MED FILL — oxyCODONE HCL 5 MG TABS: 5 | 3 days supply | Qty: 30 | Fill #0

## 2018-05-11 SURGICAL SUPPLY — 68 items
ANCHOR SUT BIOCOMP LK 2.9X12.5 (Anchor) ×4 IMPLANT
BUR 3.5 LG SPHERICAL (BURR) IMPLANT
BUR OVAL 4.0 (BURR) IMPLANT
BURR 3.5 LG SPHERICAL (BURR)
CANNULA 5.75X71 LONG (CANNULA) ×2 IMPLANT
CANNULA TWIST IN 8.25X7CM (CANNULA) ×2 IMPLANT
CHLORAPREP W/TINT 26ML (MISCELLANEOUS) ×2 IMPLANT
COVER WAND RF STERILE (DRAPES) IMPLANT
DECANTER SPIKE VIAL GLASS SM (MISCELLANEOUS) IMPLANT
DRAPE IMP U-DRAPE 54X76 (DRAPES) ×2 IMPLANT
DRAPE INCISE IOBAN 66X45 STRL (DRAPES) ×2 IMPLANT
DRAPE STERI 35X30 U-POUCH (DRAPES) ×2 IMPLANT
DRAPE SURG 17X23 STRL (DRAPES) ×2 IMPLANT
DRAPE U-SHAPE 47X51 STRL (DRAPES) ×2 IMPLANT
DRAPE U-SHAPE 76X120 STRL (DRAPES) ×4 IMPLANT
DRSG PAD ABDOMINAL 8X10 ST (GAUZE/BANDAGES/DRESSINGS) IMPLANT
ELECT REM PT RETURN 9FT ADLT (ELECTROSURGICAL)
ELECTRODE REM PT RTRN 9FT ADLT (ELECTROSURGICAL) IMPLANT
FIBERSTICK 2 (SUTURE) IMPLANT
GAUZE 4X4 16PLY RFD (DISPOSABLE) IMPLANT
GAUZE SPONGE 4X4 12PLY STRL (GAUZE/BANDAGES/DRESSINGS) ×2 IMPLANT
GAUZE XEROFORM 1X8 LF (GAUZE/BANDAGES/DRESSINGS) ×2 IMPLANT
GLOVE BIO SURGEON STRL SZ7 (GLOVE) ×2 IMPLANT
GLOVE BIO SURGEON STRL SZ7.5 (GLOVE) ×2 IMPLANT
GLOVE BIOGEL PI IND STRL 7.0 (GLOVE) ×1 IMPLANT
GLOVE BIOGEL PI IND STRL 8 (GLOVE) ×1 IMPLANT
GLOVE BIOGEL PI INDICATOR 7.0 (GLOVE) ×1
GLOVE BIOGEL PI INDICATOR 8 (GLOVE) ×1
GLOVE ECLIPSE 6.5 STRL STRAW (GLOVE) ×2 IMPLANT
GOWN STRL REUS W/ TWL LRG LVL3 (GOWN DISPOSABLE) ×2 IMPLANT
GOWN STRL REUS W/ TWL XL LVL3 (GOWN DISPOSABLE) ×2 IMPLANT
GOWN STRL REUS W/TWL LRG LVL3 (GOWN DISPOSABLE) ×2
GOWN STRL REUS W/TWL XL LVL3 (GOWN DISPOSABLE) ×2
IV NS IRRIG 3000ML ARTHROMATIC (IV SOLUTION) ×8 IMPLANT
KIT BIO-SUTURETAK 2.4 SPR TROC (KITS) IMPLANT
KIT PUSHLOCK 2.9 HIP (KITS) ×2 IMPLANT
LASSO 90 CVE QUICKPAS (DISPOSABLE) ×2 IMPLANT
LASSO CRESCENT QUICKPASS (SUTURE) IMPLANT
MANIFOLD NEPTUNE II (INSTRUMENTS) ×2 IMPLANT
NEEDLE SPNL 18GX3.5 QUINCKE PK (NEEDLE) ×2 IMPLANT
NS IRRIG 1000ML POUR BTL (IV SOLUTION) IMPLANT
PACK ARTHROSCOPY DSU (CUSTOM PROCEDURE TRAY) ×2 IMPLANT
PACK BASIN DAY SURGERY FS (CUSTOM PROCEDURE TRAY) ×2 IMPLANT
PROBE BIPOLAR ATHRO 135MM 90D (MISCELLANEOUS) ×2 IMPLANT
RESECTOR FULL RADIUS 4.2MM (BLADE) ×2 IMPLANT
SET ARTHROSCOPY TUBING (MISCELLANEOUS) ×1
SET ARTHROSCOPY TUBING LN (MISCELLANEOUS) ×1 IMPLANT
SHEET MEDIUM DRAPE 40X70 STRL (DRAPES) ×2 IMPLANT
SLEEVE SCD COMPRESS KNEE MED (MISCELLANEOUS) ×2 IMPLANT
SLING ARM FOAM STRAP LRG (SOFTGOODS) IMPLANT
SLING ARM IMMOBILIZER LRG (SOFTGOODS) ×2 IMPLANT
SLING ARM IMMOBILIZER MED (SOFTGOODS) IMPLANT
SLING ARM MED ADULT FOAM STRAP (SOFTGOODS) IMPLANT
SLING ARM XL FOAM STRAP (SOFTGOODS) IMPLANT
SUPPORT WRAP ARM LG (MISCELLANEOUS) ×2 IMPLANT
SUT ETHILON 3 0 PS 1 (SUTURE) ×2 IMPLANT
SUT ETHILON 4 0 PS 2 18 (SUTURE) IMPLANT
SUT MNCRL AB 3-0 PS2 18 (SUTURE) IMPLANT
SUT PDS AB 1 CT  36 (SUTURE) ×2
SUT PDS AB 1 CT 36 (SUTURE) ×2 IMPLANT
SUTURE TAPE 1.3 40 TPR END (SUTURE) ×2 IMPLANT
SUTURETAPE 1.3 40 TPR END (SUTURE) ×4
TAPE LABRALWHITE 1.5X36 (TAPE) IMPLANT
TAPE SUT LABRALTAP WHT/BLK (SUTURE) ×4 IMPLANT
TOWEL GREEN STERILE FF (TOWEL DISPOSABLE) ×4 IMPLANT
TOWEL OR NON WOVEN STRL DISP B (DISPOSABLE) IMPLANT
TUBE CONNECTING 20X1/4 (TUBING) IMPLANT
WATER STERILE IRR 1000ML POUR (IV SOLUTION) ×2 IMPLANT

## 2018-05-11 NOTE — Anesthesia Procedure Notes (Signed)
Anesthesia Regional Block: Interscalene brachial plexus block   Pre-Anesthetic Checklist: ,, timeout performed, Correct Patient, Correct Site, Correct Laterality, Correct Procedure, Correct Position, site marked, Risks and benefits discussed,  Surgical consent,  Pre-op evaluation,  At surgeon's request and post-op pain management  Laterality: Left  Prep: chloraprep       Needles:  Injection technique: Single-shot  Needle Type: Echogenic Stimulator Needle     Needle Length: 9cm  Needle Gauge: 21   Needle insertion depth: 5 cm   Additional Needles:   Procedures:,,,, ultrasound used (permanent image in chart),,,,  Narrative:  Start time: 05/11/2018 12:12 PM End time: 05/11/2018 12:17 PM Injection made incrementally with aspirations every 5 mL.  Performed by: Personally  Anesthesiologist: Josephine Igo, MD  Additional Notes: Timeout performed. Patient sedated. Relevant anatomy ID'd using [place]. Incremental 2-71ml injection of LA with frequent aspiration. Patient tolerated procedure well.\       Left Interscalene Block

## 2018-05-11 NOTE — Progress Notes (Signed)
   Assisted Dr. Foster with left, ultrasound guided, interscalene  block. Side rails up, monitors on throughout procedure. See vital signs in flow sheet. Tolerated Procedure well.  

## 2018-05-11 NOTE — H&P (Signed)
[name] [name] is an 52 y.o. male.   Chief Complaint: L shoulder pain  HPI: s/p previous posterior labral repair with continued pain.  Failed extensive conservative treatment.  Past Medical History:  Diagnosis Date  . Allergy   . Anxiety   . Diverticulitis   . OCD (obsessive compulsive disorder)   . Tear of left glenoid labrum, posterior 05/13/2016    Past Surgical History:  Procedure Laterality Date  . COLONOSCOPY    . PARTIAL COLECTOMY  2009   for diverticulitis  . SHOULDER ARTHROSCOPY WITH BANKART REPAIR Left 05/13/2016   Procedure: SHOULDER ARTHROSCOPY WITH POSTERIOR BANKART REPAIR with extensive debridement, left bursectomy;  Surgeon: Marchia Bond, MD;  Location: [place];  Service: Orthopedics;  Laterality: Left;  pre/Post Op Scalene block  . WISDOM TOOTH EXTRACTION  1988    Family History  Problem Relation Age of Onset  . Colon cancer Neg Hx    Social History:  reports that he has never smoked. He has never used smokeless tobacco. He reports that he drinks about 4.0 standard drinks of alcohol per week. He reports that he does not use drugs.  Allergies:  Allergies  Allergen Reactions  . Dextromethorphan Hives and Swelling  . Hydrocodone-Acetaminophen Hives    Hydrocodone - not hydrocodone-acetaminophen    Medications Prior to Admission  Medication Sig Dispense Refill  . Ascorbic Acid (VITAMIN C) 1000 MG tablet Take 1,000 mg by mouth daily.    . Cholecalciferol (VITAMIN D PO) Take 25,000 mcg by mouth daily.    . clomiPHENE (CLOMID) 50 MG tablet 1/4 tab, 3 times weekly. 10 tablet 5  . clonazePAM (KLONOPIN) 1 MG tablet Take 1 mg by mouth at bedtime.     . Cyanocobalamin (VITAMIN B-12 PO) Take 2,000 mcg by mouth daily.    . Multiple Vitamin (MULTIVITAMIN) tablet Take 1 tablet by mouth daily.      No results found for this or any previous visit (from the past 48 hour(s)). No results found.  Review of Systems  All other systems reviewed and are  negative.   Blood pressure (!) 139/101, pulse 61, temperature 98.7 F (37.1 C), temperature source Oral, resp. rate 14, height 5\' 10"  (1.778 m), weight 72.5 kg, SpO2 100 %. Physical Exam  Constitutional: He is oriented to person, place, and time. He appears well-developed and well-nourished.  HENT:  Head: Atraumatic.  Eyes: EOM are normal.  Cardiovascular: Intact distal pulses.  Respiratory: Effort normal.  Musculoskeletal:  L shoulder anterior pain with biceps testing. NVID  Neurological: He is alert and oriented to person, place, and time.  Skin: Skin is warm and dry.  Psychiatric: He has a normal mood and affect.     Assessment/Plan s/p previous posterior labral repair with continued pain.  Failed extensive conservative treatment. Plan L shoulder arthroscopic exam with poss revision labral repair, possible biceps tenodesis Risks / benefits of surgery discussed Consent on chart  NPO for OR Preop antibiotics   Isabella Stalling, MD 05/11/2018, 1:19 PM

## 2018-05-11 NOTE — Transfer of Care (Signed)
Immediate Anesthesia Transfer of Care Note  Patient: [name] [name]  Procedure(s) Performed: LEFT SHOULDER ARTHROSCOPIC WITH LABRAL REPAIR AND BICEPS TENODESIS (Left Shoulder) BICEPS TENODESIS (Left Shoulder) SHOULDER ARTHROSCOPY WITH SUBACROMIAL DECOMPRESSION (Left Shoulder)  Patient Location: PACU  Anesthesia Type:General and Regional  Level of Consciousness: awake and sedated  Airway & Oxygen Therapy: Patient Spontanous Breathing and Patient connected to face mask oxygen  Post-op Assessment: Report given to RN and Post -op Vital signs reviewed and stable  Post vital signs: Reviewed and stable  Last Vitals:  Vitals Value Taken Time  BP 117/75 05/11/2018  3:38 PM  Temp    Pulse 78 05/11/2018  3:41 PM  Resp 19 05/11/2018  3:41 PM  SpO2 100 % 05/11/2018  3:41 PM    Last Pain:  Vitals:   05/11/18 1130  TempSrc: Oral  PainSc: 0-No pain         Complications: No apparent anesthesia complications

## 2018-05-11 NOTE — Anesthesia Preprocedure Evaluation (Addendum)
Anesthesia Evaluation  Patient identified by MRN, date of birth, ID band Patient awake    Reviewed: Allergy & Precautions, NPO status , Patient's Chart, lab work & pertinent test results  Airway Mallampati: II  TM Distance: >3 FB Neck ROM: Full    Dental  (+) Teeth Intact, Dental Advisory Given   Pulmonary neg pulmonary ROS,    breath sounds clear to auscultation       Cardiovascular negative cardio ROS   Rhythm:Regular Rate:Normal     Neuro/Psych PSYCHIATRIC DISORDERS Anxiety negative neurological ROS     GI/Hepatic Neg liver ROS, Hx/o Diverticulosis S/P partial colectomy   Endo/Other  negative endocrine ROS  Renal/GU negative Renal ROS  negative genitourinary   Musculoskeletal Labral tear left shoulder Biceps tendonopathy   Abdominal   Peds  Hematology negative hematology ROS (+)   Anesthesia Other Findings   Reproductive/Obstetrics                            Anesthesia Physical  Anesthesia Plan  ASA: I  Anesthesia Plan: General and Regional   Post-op Pain Management:    Induction: Intravenous  PONV Risk Score and Plan: 1 and Ondansetron  Airway Management Planned: Oral ETT and LMA  Additional Equipment:   Intra-op Plan:   Post-operative Plan: Extubation in OR  Informed Consent: I have reviewed the patients History and Physical, chart, labs and discussed the procedure including the risks, benefits and alternatives for the proposed anesthesia with the patient or authorized representative who has indicated his/her understanding and acceptance.   Dental advisory given  Plan Discussed with: CRNA, Anesthesiologist and Surgeon  Anesthesia Plan Comments:         Anesthesia Quick Evaluation

## 2018-05-11 NOTE — Anesthesia Postprocedure Evaluation (Signed)
Anesthesia Post Note  Patient: [name] [name]  Procedure(s) Performed: LEFT SHOULDER ARTHROSCOPIC WITH LABRAL REPAIR AND BICEPS TENODESIS (Left Shoulder) BICEPS TENODESIS (Left Shoulder) SHOULDER ARTHROSCOPY WITH SUBACROMIAL DECOMPRESSION (Left Shoulder)     Patient location during evaluation: PACU Anesthesia Type: Regional and General Level of consciousness: awake and alert Pain management: pain level controlled Vital Signs Assessment: post-procedure vital signs reviewed and stable Respiratory status: spontaneous breathing, nonlabored ventilation, respiratory function stable and patient connected to nasal cannula oxygen Cardiovascular status: blood pressure returned to baseline and stable Postop Assessment: no apparent nausea or vomiting Anesthetic complications: no    Last Vitals:  Vitals:   05/11/18 1541 05/11/18 1615  BP:    Pulse: 78 63  Resp: 19 (!) 8  Temp:    SpO2: 100% 99%    Last Pain:  Vitals:   05/11/18 1615  TempSrc:   PainSc: 0-No pain                 Montez Hageman

## 2018-05-11 NOTE — Op Note (Signed)
Procedure(s):  Procedure Note  [name] [name] male 52 y.o. 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 #3 severe subacromial bursitis/rotator cuff tendinopathy with unfavorable acromial anatomy  Procedure(s) and Anesthesia Type: #1 revision left shoulder posterior labral repair #2 left proximal long head arthroscopic biceps tenodesis #3 debridement severe bursitis/rotator cuff tendinopathy with subacromial decompression   Surgeon(s) and Role:    Tania Ade, MD - Primary    * Marchia Bond, MD - Assisting     Surgeon: Isabella Stalling   Assistants: Marchia Bond MD  Anesthesia: General endotracheal anesthesia with preoperative interscalene block given by the attending anesthesiologist    Procedure Detail  Estimated Blood Loss: Min         Drains: none  Blood Given: none         Specimens: none        Complications:  * No complications entered in OR log *         Disposition: PACU - hemodynamically stable.         Condition: stable    Procedure:   INDICATIONS FOR SURGERY: The patient is 52 y.o. male who had a left shoulder arthroscopy in the past with one anchor repair of the posterior labrum.  He went on to have continued shoulder pain of unclear etiology.  He had an extensive work-up and extensive conservative management and ultimately wished to go forward with surgical management.  We talked about diagnostic arthroscopy with careful examination of the posterior labrum as well as the anterior structures specifically looking at the biceps and anterior rotator cuff given his preoperative symptoms.  We talked about revision labral repair and possible biceps tenodesis.  OPERATIVE FINDINGS: Examination under  anesthesia: Mild stiffness in terminal forward flexion, otherwise no stiffness or instability.  DESCRIPTION OF PROCEDURE: The patient was identified in preoperative  holding area where I personally marked the operative site after  verifying site, side, and procedure with the patient. An interscalene block was given by the attending anesthesiologist the holding area.  The patient was taken back to the operating room where general anesthesia was induced without complication and was placed in the beach-chair position with the back  elevated about 60 degrees and all extremities and head and neck carefully padded and  positioned.   The left upper extremity was then prepped and  draped in a standard sterile fashion. The appropriate time-out  procedure was carried out. The patient did receive IV antibiotics  within 30 minutes of incision.   A small posterior portal incision was made and the arthroscope was introduced into the joint. An anterior portal was then established above the subscapularis using needle localization. Small cannula was placed anteriorly. Diagnostic arthroscopy was then carried out.  The Gleno humeral joint surfaces were noted to be intact.  There was no chondral malacia of the humeral head.  There is a few areas of thinning on the glenoid but nothing with exposed bone.  The anterior and superior glenoid labrum were noted to be intact.  The biceps tendon was pulled into the joint and noted to have fairly severe tenosynovitis in the portion that would be in the intertubercular groove.  There is no significant tearing of the tendon.  The subscapularis was intact.  The undersurface of the supraspinatus and infraspinatus were  intact.  The camera was moved to the anterior portal and probe was used to the posterior portal to carefully examined the posterior labrum.  The previously placed anchor was in good position there is no sign of loosening or displacement of the anchor.  The suture was  also intact around the labrum and the labrum appeared to be healed at the location of the anchor.  Anchors at about the 9 o'clock position.  Below this from about the 8:00 to 6 o'clock position however there was some fissuring of the labrum from the glenoid and this was felt to potentially represent recurrent or residual tearing.  We felt that this could be potentially a cause of the patient's continued discomfort in that further repair of this was indicated.  Therefore a high lateral interval portal was established for the camera and a large cannula was placed posteriorly.  The labrum was freed up with an arthroscopic elevator and debrider with a rasp to promote healing.  The repair was then carried out passing a labral tape inferiorly and into an anchor using a 2.9 push lock anchor at about the 630 and again at the 8:00 position, securing the tear nicely.  The remainder of the labrum was felt to be intact.  The camera was then again placed in the posterior portal and given his symptoms relatable to chronic biceps tendinopathy we did go ahead with a arthroscopic tenodesis using the pitt technique.  Using a percutaneous technique spinal needles were placed through the proximal long head biceps and 2 suture tapes were passed using a shuttling technique with a PDS suture in a crossing fashion.  The proximal long head was then released from the superior labrum and the superior labrum was debrided.  The arthroscope was then introduced into the subacromial space a standard lateral portal was established with needle localization.  He was noted to have a significant amount of thickened hypertrophied bursa over the anterolateral rotator cuff.  This was extensively debrided with a shaver down to the bursal surface of the rotator cuff which was carefully examined and noted to be intact.  The sutures were found anteriorly and sequentially pulled out the lateral portal and tied over the rotator interval securing the biceps  to the interval tissue.  The remainder of the rotator cuff was carefully examined and noted to be completely intact but the undersurface of the anterior acromion and coracoacromial ligament was thickened and erythematous indicative of chronic impingement.  We felt that further decompression of this would be beneficial given his anterior symptoms.  Therefore the coracoacromial ligament was peeled off anteriorly and the anterior acromion was smoothed with a bur down to a type I anatomy which was viewed from lateral as well to ensure complete decompression.   The arthroscopic equipment was removed from the joint and the portals were closed with 3-0 nylon in an interrupted fashion. Sterile dressings were then applied including Xeroform 4 x 4's ABDs and tape. The patient was then allowed to awaken from general anesthesia, placed in a sling, transferred to the stretcher and taken to the recovery room in stable condition.   POSTOPERATIVE PLAN: The patient will be discharged home today and will followup in one week for suture removal and wound check.  We will follow a labral protocol.

## 2018-05-11 NOTE — Anesthesia Procedure Notes (Signed)
Procedure Name: LMA Insertion Performed by: Damian Hofstra W, CRNA Pre-anesthesia Checklist: Patient identified, Emergency Drugs available, Suction available and Patient being monitored Patient Re-evaluated:Patient Re-evaluated prior to induction Oxygen Delivery Method: Circle system utilized Preoxygenation: Pre-oxygenation with 100% oxygen Induction Type: IV induction Ventilation: Mask ventilation without difficulty LMA: LMA inserted LMA Size: 4.0 Number of attempts: 1 Placement Confirmation: positive ETCO2 Tube secured with: Tape Dental Injury: Teeth and Oropharynx as per pre-operative assessment        

## 2018-05-11 NOTE — Discharge Instructions (Signed)
Discharge Instructions after Arthroscopic Shoulder Repair   A sling has been provided for you. Remain in your sling at all times. This includes sleeping in your sling.  Use ice on the shoulder intermittently over the first 48 hours after surgery.  Pain medicine has been prescribed for you.  Use your medicine liberally over the first 48 hours, and then you can begin to taper your use. You may take Extra Strength Tylenol or Tylenol only in place of the pain pills. DO NOT take ANY nonsteroidal anti-inflammatory pain medications: Advil, Motrin, Ibuprofen, Aleve, Naproxen, or Narprosyn.  You may remove your dressing after two days. If the incision sites are still moist, place a Band-Aid over the moist site(s). Change Band-Aids daily until dry.  You may shower 5 days after surgery. The incisions CANNOT get wet prior to 5 days. Simply allow the water to wash over the site and then pat dry. Do not rub the incisions. Make sure your axilla (armpit) is completely dry after showering.  Take one aspirin a day for 2 weeks after surgery, unless you have an aspirin sensitivity/ allergy or asthma.   Please call [phone] during normal business hours or [phone] after hours for any problems. Including the following:  - excessive redness of the incisions - drainage for more than 4 days - fever of more than 101.5 F  *Please note that pain medications will not be refilled after hours or on weekends.   Post Anesthesia Home Care Instructions  Activity: Get plenty of rest for the remainder of the day. A responsible individual must stay with you for 24 hours following the procedure.  For the next 24 hours, DO NOT: -Drive a car -Operate machinery -Drink alcoholic beverages -Take any medication unless instructed by your physician -Make any legal decisions or sign important papers.  Meals: Start with liquid foods such as gelatin or soup. Progress to regular foods as tolerated. Avoid greasy, spicy, heavy  foods. If nausea and/or vomiting occur, drink only clear liquids until the nausea and/or vomiting subsides. Call your physician if vomiting continues.  Special Instructions/Symptoms: Your throat may feel dry or sore from the anesthesia or the breathing tube placed in your throat during surgery. If this causes discomfort, gargle with warm salt water. The discomfort should disappear within 24 hours.  If you had a scopolamine patch placed behind your ear for the management of post- operative nausea and/or vomiting:  1. The medication in the patch is effective for 72 hours, after which it should be removed.  Wrap patch in a tissue and discard in the trash. Wash hands thoroughly with soap and water. 2. You may remove the patch earlier than 72 hours if you experience unpleasant side effects which may include dry mouth, dizziness or visual disturbances. 3. Avoid touching the patch. Wash your hands with soap and water after contact with the patch.     Regional Anesthesia Blocks  1. Numbness or the inability to move the "blocked" extremity may last from 3-48 hours after placement. The length of time depends on the medication injected and your individual response to the medication. If the numbness is not going away after 48 hours, call your surgeon.  2. The extremity that is blocked will need to be protected until the numbness is gone and the  Strength has returned. Because you cannot feel it, you will need to take extra care to avoid injury. Because it may be weak, you may have difficulty moving it or using it. You may   not know what position it is in without looking at it while the block is in effect.  3. For blocks in the legs and feet, returning to weight bearing and walking needs to be done carefully. You will need to wait until the numbness is entirely gone and the strength has returned. You should be able to move your leg and foot normally before you try and bear weight or walk. You will need someone  to be with you when you first try to ensure you do not fall and possibly risk injury.  4. Bruising and tenderness at the needle site are common side effects and will resolve in a few days.  5. Persistent numbness or new problems with movement should be communicated to the surgeon or the [facility] ([phone])/ [facility] ([phone]).  Information for Discharge Teaching: EXPAREL (bupivacaine liposome injectable suspension)   Your surgeon or anesthesiologist gave you EXPAREL(bupivacaine) to help control your pain after surgery.   EXPAREL is a local anesthetic that provides pain relief by numbing the tissue around the surgical site.  EXPAREL is designed to release pain medication over time and can control pain for up to 72 hours.  Depending on how you respond to EXPAREL, you may require less pain medication during your recovery.  Possible side effects:  Temporary loss of sensation or ability to move in the area where bupivacaine was injected.  Nausea, vomiting, constipation  Rarely, numbness and tingling in your mouth or lips, lightheadedness, or anxiety may occur.  Call your doctor right away if you think you may be experiencing any of these sensations, or if you have other questions regarding possible side effects.  Follow all other discharge instructions given to you by your surgeon or nurse. Eat a healthy diet and drink plenty of water or other fluids.  If you return to the hospital for any reason within 96 hours following the administration of EXPAREL, it is important for health care providers to know that you have received this anesthetic. A teal colored band has been placed on your arm with the date, time and amount of EXPAREL you have received in order to alert and inform your health care providers. Please leave this armband in place for the full 96 hours following administration, and then you may remove the band.    

## 2018-05-12 ENCOUNTER — Encounter (HOSPITAL_BASED_OUTPATIENT_CLINIC_OR_DEPARTMENT_OTHER): Payer: Self-pay | Admitting: Orthopedic Surgery

## 2018-05-17 DIAGNOSIS — F411 Generalized anxiety disorder: Secondary | ICD-10-CM | POA: Diagnosis not present

## 2018-05-19 DIAGNOSIS — Z9889 Other specified postprocedural states: Secondary | ICD-10-CM | POA: Diagnosis not present

## 2018-05-22 DIAGNOSIS — F4322 Adjustment disorder with anxiety: Secondary | ICD-10-CM | POA: Diagnosis not present

## 2018-06-02 DIAGNOSIS — F4322 Adjustment disorder with anxiety: Secondary | ICD-10-CM | POA: Diagnosis not present

## 2018-06-06 DIAGNOSIS — F4322 Adjustment disorder with anxiety: Secondary | ICD-10-CM | POA: Diagnosis not present

## 2018-06-06 MED FILL — clonazePAM 1 MG TABS: 1 | 30 days supply | Qty: 60 | Fill #5

## 2018-06-07 MED FILL — CLOMIPHENE CITRATE 50 MG TA: 50 | 84 days supply | Qty: 9 | Fill #1

## 2018-06-19 DIAGNOSIS — L905 Scar conditions and fibrosis of skin: Secondary | ICD-10-CM | POA: Diagnosis not present

## 2018-06-19 DIAGNOSIS — L814 Other melanin hyperpigmentation: Secondary | ICD-10-CM | POA: Diagnosis not present

## 2018-06-19 DIAGNOSIS — L821 Other seborrheic keratosis: Secondary | ICD-10-CM | POA: Diagnosis not present

## 2018-06-21 DIAGNOSIS — S43432D Superior glenoid labrum lesion of left shoulder, subsequent encounter: Secondary | ICD-10-CM | POA: Diagnosis not present

## 2018-06-21 DIAGNOSIS — Z9889 Other specified postprocedural states: Secondary | ICD-10-CM | POA: Diagnosis not present

## 2018-06-21 DIAGNOSIS — M25612 Stiffness of left shoulder, not elsewhere classified: Secondary | ICD-10-CM | POA: Diagnosis not present

## 2018-06-22 DIAGNOSIS — S43432D Superior glenoid labrum lesion of left shoulder, subsequent encounter: Secondary | ICD-10-CM | POA: Diagnosis not present

## 2018-06-22 DIAGNOSIS — M25612 Stiffness of left shoulder, not elsewhere classified: Secondary | ICD-10-CM | POA: Diagnosis not present

## 2018-06-27 DIAGNOSIS — M25612 Stiffness of left shoulder, not elsewhere classified: Secondary | ICD-10-CM | POA: Diagnosis not present

## 2018-06-27 DIAGNOSIS — S43432D Superior glenoid labrum lesion of left shoulder, subsequent encounter: Secondary | ICD-10-CM | POA: Diagnosis not present

## 2018-06-28 DIAGNOSIS — M25612 Stiffness of left shoulder, not elsewhere classified: Secondary | ICD-10-CM | POA: Diagnosis not present

## 2018-06-28 DIAGNOSIS — S43432D Superior glenoid labrum lesion of left shoulder, subsequent encounter: Secondary | ICD-10-CM | POA: Diagnosis not present

## 2018-06-30 DIAGNOSIS — S43432D Superior glenoid labrum lesion of left shoulder, subsequent encounter: Secondary | ICD-10-CM | POA: Diagnosis not present

## 2018-06-30 DIAGNOSIS — M25612 Stiffness of left shoulder, not elsewhere classified: Secondary | ICD-10-CM | POA: Diagnosis not present

## 2018-07-03 MED FILL — clonazePAM 1 MG TABS: 1 | 30 days supply | Qty: 60 | Fill #0

## 2018-07-04 DIAGNOSIS — S43432D Superior glenoid labrum lesion of left shoulder, subsequent encounter: Secondary | ICD-10-CM | POA: Diagnosis not present

## 2018-07-04 DIAGNOSIS — M25612 Stiffness of left shoulder, not elsewhere classified: Secondary | ICD-10-CM | POA: Diagnosis not present

## 2018-07-11 DIAGNOSIS — M25612 Stiffness of left shoulder, not elsewhere classified: Secondary | ICD-10-CM | POA: Diagnosis not present

## 2018-07-11 DIAGNOSIS — S43432D Superior glenoid labrum lesion of left shoulder, subsequent encounter: Secondary | ICD-10-CM | POA: Diagnosis not present

## 2018-07-14 DIAGNOSIS — S43432D Superior glenoid labrum lesion of left shoulder, subsequent encounter: Secondary | ICD-10-CM | POA: Diagnosis not present

## 2018-07-14 DIAGNOSIS — M25612 Stiffness of left shoulder, not elsewhere classified: Secondary | ICD-10-CM | POA: Diagnosis not present

## 2018-07-17 DIAGNOSIS — S43432D Superior glenoid labrum lesion of left shoulder, subsequent encounter: Secondary | ICD-10-CM | POA: Diagnosis not present

## 2018-07-17 DIAGNOSIS — M25612 Stiffness of left shoulder, not elsewhere classified: Secondary | ICD-10-CM | POA: Diagnosis not present

## 2018-07-19 DIAGNOSIS — M25612 Stiffness of left shoulder, not elsewhere classified: Secondary | ICD-10-CM | POA: Diagnosis not present

## 2018-07-19 DIAGNOSIS — S43432D Superior glenoid labrum lesion of left shoulder, subsequent encounter: Secondary | ICD-10-CM | POA: Diagnosis not present

## 2018-07-19 DIAGNOSIS — F411 Generalized anxiety disorder: Secondary | ICD-10-CM | POA: Diagnosis not present

## 2018-07-24 DIAGNOSIS — M25612 Stiffness of left shoulder, not elsewhere classified: Secondary | ICD-10-CM | POA: Diagnosis not present

## 2018-07-24 DIAGNOSIS — S43432D Superior glenoid labrum lesion of left shoulder, subsequent encounter: Secondary | ICD-10-CM | POA: Diagnosis not present

## 2018-07-24 DIAGNOSIS — F419 Anxiety disorder, unspecified: Secondary | ICD-10-CM | POA: Diagnosis not present

## 2018-07-26 DIAGNOSIS — M25612 Stiffness of left shoulder, not elsewhere classified: Secondary | ICD-10-CM | POA: Diagnosis not present

## 2018-07-26 DIAGNOSIS — S43432D Superior glenoid labrum lesion of left shoulder, subsequent encounter: Secondary | ICD-10-CM | POA: Diagnosis not present

## 2018-07-27 ENCOUNTER — Telehealth: Payer: Self-pay | Admitting: Internal Medicine

## 2018-07-27 DIAGNOSIS — Z800 Family history of malignant neoplasm of digestive organs: Secondary | ICD-10-CM

## 2018-07-27 NOTE — Telephone Encounter (Signed)
Asking about colonoscopy in 2020 - brother died from colon cancer last year

## 2018-07-28 ENCOUNTER — Encounter: Payer: Self-pay | Admitting: Internal Medicine

## 2018-07-28 DIAGNOSIS — Z800 Family history of malignant neoplasm of digestive organs: Secondary | ICD-10-CM | POA: Insufficient documentation

## 2018-07-28 NOTE — Telephone Encounter (Signed)
Left message for patient to call back  

## 2018-07-28 NOTE — Telephone Encounter (Signed)
Dr. Tommy Medal needs to arrange direct screening colonoscopy  dxs - screening and family history of colon cancer  Please contact him when able to arrange on a date that works for him  He knows he needs a care partner - that could be difficult  I have told him I am willing to do that for him if we do that will need to be last case of the day

## 2018-07-30 MED FILL — clonazePAM 1 MG TABS: 1 | 30 days supply | Qty: 60 | Fill #1

## 2018-07-31 DIAGNOSIS — S43432D Superior glenoid labrum lesion of left shoulder, subsequent encounter: Secondary | ICD-10-CM | POA: Diagnosis not present

## 2018-07-31 DIAGNOSIS — M25612 Stiffness of left shoulder, not elsewhere classified: Secondary | ICD-10-CM | POA: Diagnosis not present

## 2018-07-31 NOTE — Telephone Encounter (Signed)
Patient has been scheduled for colon and pre-visit.

## 2018-08-02 DIAGNOSIS — M25612 Stiffness of left shoulder, not elsewhere classified: Secondary | ICD-10-CM | POA: Diagnosis not present

## 2018-08-02 DIAGNOSIS — S43432D Superior glenoid labrum lesion of left shoulder, subsequent encounter: Secondary | ICD-10-CM | POA: Diagnosis not present

## 2018-08-02 DIAGNOSIS — Z9889 Other specified postprocedural states: Secondary | ICD-10-CM | POA: Diagnosis not present

## 2018-08-07 DIAGNOSIS — F411 Generalized anxiety disorder: Secondary | ICD-10-CM | POA: Diagnosis not present

## 2018-08-07 DIAGNOSIS — F4322 Adjustment disorder with anxiety: Secondary | ICD-10-CM | POA: Diagnosis not present

## 2018-08-07 DIAGNOSIS — M25612 Stiffness of left shoulder, not elsewhere classified: Secondary | ICD-10-CM | POA: Diagnosis not present

## 2018-08-07 DIAGNOSIS — S43432D Superior glenoid labrum lesion of left shoulder, subsequent encounter: Secondary | ICD-10-CM | POA: Diagnosis not present

## 2018-08-09 DIAGNOSIS — M25612 Stiffness of left shoulder, not elsewhere classified: Secondary | ICD-10-CM | POA: Diagnosis not present

## 2018-08-09 DIAGNOSIS — S43432D Superior glenoid labrum lesion of left shoulder, subsequent encounter: Secondary | ICD-10-CM | POA: Diagnosis not present

## 2018-08-14 ENCOUNTER — Ambulatory Visit (AMBULATORY_SURGERY_CENTER): Payer: Self-pay

## 2018-08-14 VITALS — Ht 70.0 in | Wt 157.0 lb

## 2018-08-14 DIAGNOSIS — M25612 Stiffness of left shoulder, not elsewhere classified: Secondary | ICD-10-CM | POA: Diagnosis not present

## 2018-08-14 DIAGNOSIS — S43432D Superior glenoid labrum lesion of left shoulder, subsequent encounter: Secondary | ICD-10-CM | POA: Diagnosis not present

## 2018-08-14 DIAGNOSIS — Z800 Family history of malignant neoplasm of digestive organs: Secondary | ICD-10-CM

## 2018-08-14 MED ORDER — NA SULFATE-K SULFATE-MG SULF 17.5-3.13-1.6 GM/177ML PO SOLN: 1.0000 | Freq: Once | ORAL | 0 refills | Status: AC

## 2018-08-14 MED FILL — SUPREP BOWEL PREP KIT: 17.5-3.13-1 | 1 days supply | Qty: 354 | Fill #0

## 2018-08-14 NOTE — Progress Notes (Signed)
Denies allergies to eggs or soy products. Denies complication of anesthesia or sedation. Denies use of weight loss medication. Denies use of O2.   Emmi instructions declined.   Patient was given Suprep for his prep because it is less expensive for [employer].

## 2018-08-16 DIAGNOSIS — M25612 Stiffness of left shoulder, not elsewhere classified: Secondary | ICD-10-CM | POA: Diagnosis not present

## 2018-08-16 DIAGNOSIS — F4322 Adjustment disorder with anxiety: Secondary | ICD-10-CM | POA: Diagnosis not present

## 2018-08-16 DIAGNOSIS — S43432D Superior glenoid labrum lesion of left shoulder, subsequent encounter: Secondary | ICD-10-CM | POA: Diagnosis not present

## 2018-08-21 ENCOUNTER — Encounter: Payer: Self-pay | Admitting: Internal Medicine

## 2018-08-23 MED FILL — OSELTAMIVIR PHOSPHATE 75 MG: 75 | 10 days supply | Qty: 10 | Fill #0

## 2018-08-23 MED FILL — SUPREP BOWEL PREP KIT: 17.5-3.13-1 | 1 days supply | Qty: 354 | Fill #0

## 2018-08-24 DIAGNOSIS — F4322 Adjustment disorder with anxiety: Secondary | ICD-10-CM | POA: Diagnosis not present

## 2018-08-25 MED FILL — CLOMIPHENE CITRATE 50 MG TA: 50 | 84 days supply | Qty: 9 | Fill #2

## 2018-08-28 DIAGNOSIS — F4322 Adjustment disorder with anxiety: Secondary | ICD-10-CM | POA: Diagnosis not present

## 2018-08-30 DIAGNOSIS — M25612 Stiffness of left shoulder, not elsewhere classified: Secondary | ICD-10-CM | POA: Diagnosis not present

## 2018-08-30 DIAGNOSIS — S43432D Superior glenoid labrum lesion of left shoulder, subsequent encounter: Secondary | ICD-10-CM | POA: Diagnosis not present

## 2018-08-30 MED FILL — clonazePAM 1 MG TABS: 1 | 30 days supply | Qty: 60 | Fill #2

## 2018-09-04 ENCOUNTER — Encounter: Payer: Self-pay | Admitting: Internal Medicine

## 2018-09-04 ENCOUNTER — Ambulatory Visit (AMBULATORY_SURGERY_CENTER): Payer: 59 | Admitting: Internal Medicine

## 2018-09-04 VITALS — BP 100/56 | HR 58 | Temp 98.4°F | Resp 14 | Ht 70.0 in | Wt 157.0 lb

## 2018-09-04 DIAGNOSIS — Z800 Family history of malignant neoplasm of digestive organs: Secondary | ICD-10-CM | POA: Diagnosis not present

## 2018-09-04 DIAGNOSIS — Z1211 Encounter for screening for malignant neoplasm of colon: Secondary | ICD-10-CM

## 2018-09-04 DIAGNOSIS — F419 Anxiety disorder, unspecified: Secondary | ICD-10-CM | POA: Diagnosis not present

## 2018-09-04 DIAGNOSIS — F329 Major depressive disorder, single episode, unspecified: Secondary | ICD-10-CM | POA: Diagnosis not present

## 2018-09-04 HISTORY — PX: COLONOSCOPY: SHX174

## 2018-09-04 MED ORDER — SODIUM CHLORIDE 0.9 % IV SOLN: 500.0000 mL | Freq: Once | INTRAVENOUS | Status: DC

## 2018-09-04 NOTE — Progress Notes (Signed)
No problems noted in the recovery room. maw 

## 2018-09-04 NOTE — Progress Notes (Signed)
Pt's states no medical or surgical changes since previsit or office visit. 

## 2018-09-04 NOTE — Patient Instructions (Addendum)
No polyps or cancer seen.  Did see some diverticulosis as before.  Your next routine colonoscopy should be in 5 years - 2025.  I appreciate the opportunity to care for you. Gatha Mayer, MD, FACG    YOU HAD AN ENDOSCOPIC PROCEDURE TODAY AT [facility]:   Refer to the procedure report that was given to you for any specific questions about what was found during the examination.  If the procedure report does not answer your questions, please call your gastroenterologist to clarify.  If you requested that your care partner not be given the details of your procedure findings, then the procedure report has been included in a sealed envelope for you to review at your convenience later.  YOU SHOULD EXPECT: Some feelings of bloating in the abdomen. Passage of more gas than usual.  Walking can help get rid of the air that was put into your GI tract during the procedure and reduce the bloating. If you had a lower endoscopy (such as a colonoscopy or flexible sigmoidoscopy) you may notice spotting of blood in your stool or on the toilet paper. If you underwent a bowel prep for your procedure, you may not have a normal bowel movement for a few days.  Please Note:  You might notice some irritation and congestion in your nose or some drainage.  This is from the oxygen used during your procedure.  There is no need for concern and it should clear up in a day or so.  SYMPTOMS TO REPORT IMMEDIATELY:   Following lower endoscopy (colonoscopy or flexible sigmoidoscopy):  Excessive amounts of blood in the stool  Significant tenderness or worsening of abdominal pains  Swelling of the abdomen that is new, acute  Fever of 100F or higher   For urgent or emergent issues, a gastroenterologist can be reached at any hour by calling [phone].   DIET:  We do recommend a small meal at first, but then you may proceed to your regular diet.  Drink plenty of fluids but you should avoid  alcoholic beverages for 24 hours.  ACTIVITY:  You should plan to take it easy for the rest of today and you should NOT DRIVE or use heavy machinery until tomorrow (because of the sedation medicines used during the test).    FOLLOW UP: Our staff will call the number listed on your records the next business day following your procedure to check on you and address any questions or concerns that you may have regarding the information given to you following your procedure. If we do not reach you, we will leave a message.  However, if you are feeling well and you are not experiencing any problems, there is no need to return our call.  We will assume that you have returned to your regular daily activities without incident.  If any biopsies were taken you will be contacted by phone or by letter within the next 1-3 weeks.  Please call us at [phone] if you have not heard about the biopsies in 3 weeks.    SIGNATURES/CONFIDENTIALITY: You and/or your care partner have signed paperwork which will be entered into your electronic medical record.  These signatures attest to the fact that that the information above on your After Visit Summary has been reviewed and is understood.  Full responsibility of the confidentiality of this discharge information lies with you and/or your care-partner.    Handout was given to your care partner on diverticulosis. You may  resume your current medications today. Repeat colonoscopy in 5 years per Dr. Carlean Purl. Please call if any questions or concerns.

## 2018-09-04 NOTE — Op Note (Addendum)
[facility] Patient Name: [name] [name] Procedure Date: 09/04/2018 1:52 PM MRN: [id number] Endoscopist: Gatha Mayer , MD Age: 53 Referring MD:  Date of Birth: [birth year] Gender: Male Account #: [id number] Procedure:                Colonoscopy Indications:              Screening in patient at increased risk: Colorectal                            cancer in brother before age 55 Medicines:                Propofol per Anesthesia, Monitored Anesthesia Care Procedure:                Pre-Anesthesia Assessment:                           - Prior to the procedure, a History and Physical                            was performed, and patient medications and                            allergies were reviewed. The patient's tolerance of                            previous anesthesia was also reviewed. The risks                            and benefits of the procedure and the sedation                            options and risks were discussed with the patient.                            All questions were answered, and informed consent                            was obtained. Prior Anticoagulants: The patient has                            taken no previous anticoagulant or antiplatelet                            agents. ASA Grade Assessment: II - A patient with                            mild systemic disease. After reviewing the risks                            and benefits, the patient was deemed in                            satisfactory condition to undergo the procedure.  After obtaining informed consent, the colonoscope                            was passed under direct vision. Throughout the                            procedure, the patient's blood pressure, pulse, and                            oxygen saturations were monitored continuously. The                            Colonoscope was introduced through the anus and   advanced to the the cecum, identified by                            appendiceal orifice and ileocecal valve. The                            colonoscopy was performed without difficulty. The                            patient tolerated the procedure well. The quality                            of the bowel preparation was good. The bowel                            preparation used was Miralax. The ileocecal valve,                            appendiceal orifice, and rectum were photographed. Scope In: 2:04:18 PM Scope Out: 2:16:07 PM Scope Withdrawal Time: 0 hours 9 minutes 46 seconds  Total Procedure Duration: 0 hours 11 minutes 49 seconds  Findings:                 The perianal and digital rectal examinations were                            normal. Pertinent negatives include normal prostate                            (size, shape, and consistency).                           Scattered diverticula were found in the left colon                            and right colon.                           The exam was otherwise without abnormality on                            direct and retroflexion views. Complications:  No immediate complications. Estimated Blood Loss:     Estimated blood loss: none. Impression:               - Diverticulosis in the left colon and in the right                            colon.                           - The examination was otherwise normal on direct                            and retroflexion views.                           - No specimens collected.                           - Family history of colon cancer in brother < 28                           Repeat colonoscopy 5 years 2025 Recommendation:           - Patient has a contact number available for                            emergencies. The signs and symptoms of potential                            delayed complications were discussed with the                            patient. Return to normal  activities tomorrow.                            Written discharge instructions were provided to the                            patient.                           - Resume previous diet. Gatha Mayer, MD 09/04/2018 2:26:55 PM This report has been signed electronically.

## 2018-09-04 NOTE — Progress Notes (Signed)
Report to PACU, RN, vss, BBS= Clear.  

## 2018-09-05 ENCOUNTER — Telehealth: Payer: Self-pay | Admitting: *Deleted

## 2018-09-05 NOTE — Telephone Encounter (Signed)
  Follow up Call-  Call back number 09/04/2018  Post procedure Call Back phone  # [phone]  Permission to leave phone message Yes  Some recent data might be hidden     Patient questions:  Do you have a fever, pain , or abdominal swelling? No. Pain Score  0 *  Have you tolerated food without any problems? Yes.    Have you been able to return to your normal activities? Yes.    Do you have any questions about your discharge instructions: Diet   No. Medications  No. Follow up visit  No.  Do you have questions or concerns about your Care? No.  Actions: * If pain score is 4 or above: No action needed, pain <4.

## 2018-09-06 DIAGNOSIS — M25612 Stiffness of left shoulder, not elsewhere classified: Secondary | ICD-10-CM | POA: Diagnosis not present

## 2018-09-06 DIAGNOSIS — S43432D Superior glenoid labrum lesion of left shoulder, subsequent encounter: Secondary | ICD-10-CM | POA: Diagnosis not present

## 2018-09-07 DIAGNOSIS — F4322 Adjustment disorder with anxiety: Secondary | ICD-10-CM | POA: Diagnosis not present

## 2018-09-13 DIAGNOSIS — S43432D Superior glenoid labrum lesion of left shoulder, subsequent encounter: Secondary | ICD-10-CM | POA: Diagnosis not present

## 2018-09-13 DIAGNOSIS — M25612 Stiffness of left shoulder, not elsewhere classified: Secondary | ICD-10-CM | POA: Diagnosis not present

## 2018-09-15 DIAGNOSIS — Z3189 Encounter for other procreative management: Secondary | ICD-10-CM | POA: Diagnosis not present

## 2018-09-21 DIAGNOSIS — F4322 Adjustment disorder with anxiety: Secondary | ICD-10-CM | POA: Diagnosis not present

## 2018-09-24 DIAGNOSIS — F411 Generalized anxiety disorder: Secondary | ICD-10-CM | POA: Diagnosis not present

## 2018-09-25 DIAGNOSIS — F4322 Adjustment disorder with anxiety: Secondary | ICD-10-CM | POA: Diagnosis not present

## 2018-09-25 DIAGNOSIS — S43432D Superior glenoid labrum lesion of left shoulder, subsequent encounter: Secondary | ICD-10-CM | POA: Diagnosis not present

## 2018-09-25 DIAGNOSIS — M25612 Stiffness of left shoulder, not elsewhere classified: Secondary | ICD-10-CM | POA: Diagnosis not present

## 2018-09-29 MED FILL — clonazePAM 1 MG TABS: 1 | 30 days supply | Qty: 60 | Fill #3

## 2018-10-02 DIAGNOSIS — M25612 Stiffness of left shoulder, not elsewhere classified: Secondary | ICD-10-CM | POA: Diagnosis not present

## 2018-10-02 DIAGNOSIS — S43432D Superior glenoid labrum lesion of left shoulder, subsequent encounter: Secondary | ICD-10-CM | POA: Diagnosis not present

## 2018-10-11 DIAGNOSIS — F4322 Adjustment disorder with anxiety: Secondary | ICD-10-CM | POA: Diagnosis not present

## 2018-10-19 DIAGNOSIS — F4322 Adjustment disorder with anxiety: Secondary | ICD-10-CM | POA: Diagnosis not present

## 2018-11-01 DIAGNOSIS — F411 Generalized anxiety disorder: Secondary | ICD-10-CM | POA: Diagnosis not present

## 2018-11-01 MED FILL — clonazePAM 1 MG TABS: 1 | 30 days supply | Qty: 60 | Fill #4

## 2018-11-09 DIAGNOSIS — F411 Generalized anxiety disorder: Secondary | ICD-10-CM | POA: Diagnosis not present

## 2018-11-10 MED FILL — CLOMIPHENE CITRATE 50 MG TA: 50 | 84 days supply | Qty: 9 | Fill #3

## 2018-11-16 DIAGNOSIS — F411 Generalized anxiety disorder: Secondary | ICD-10-CM | POA: Diagnosis not present

## 2018-11-30 DIAGNOSIS — F411 Generalized anxiety disorder: Secondary | ICD-10-CM | POA: Diagnosis not present

## 2018-12-06 DIAGNOSIS — F411 Generalized anxiety disorder: Secondary | ICD-10-CM | POA: Diagnosis not present

## 2018-12-07 MED FILL — clonazePAM 1 MG TABS: 1 | 30 days supply | Qty: 60 | Fill #5

## 2018-12-19 DIAGNOSIS — F411 Generalized anxiety disorder: Secondary | ICD-10-CM | POA: Diagnosis not present

## 2018-12-20 DIAGNOSIS — F411 Generalized anxiety disorder: Secondary | ICD-10-CM | POA: Diagnosis not present

## 2018-12-25 DIAGNOSIS — F411 Generalized anxiety disorder: Secondary | ICD-10-CM | POA: Diagnosis not present

## 2019-01-03 DIAGNOSIS — F411 Generalized anxiety disorder: Secondary | ICD-10-CM | POA: Diagnosis not present

## 2019-01-04 DIAGNOSIS — F411 Generalized anxiety disorder: Secondary | ICD-10-CM | POA: Diagnosis not present

## 2019-01-04 DIAGNOSIS — Z03818 Encounter for observation for suspected exposure to other biological agents ruled out: Secondary | ICD-10-CM | POA: Diagnosis not present

## 2019-01-05 MED FILL — clonazePAM 1 MG TABS: 1 | 90 days supply | Qty: 180 | Fill #0

## 2019-01-08 DIAGNOSIS — F411 Generalized anxiety disorder: Secondary | ICD-10-CM | POA: Diagnosis not present

## 2019-01-15 MED FILL — clonazePAM 1 MG TABS: 1 | 90 days supply | Qty: 180 | Fill #0

## 2019-01-16 MED FILL — CLOMIPHENE CITRATE 50 MG TA: 50 | 84 days supply | Qty: 9 | Fill #4

## 2019-01-18 DIAGNOSIS — F411 Generalized anxiety disorder: Secondary | ICD-10-CM | POA: Diagnosis not present

## 2019-01-24 DIAGNOSIS — F411 Generalized anxiety disorder: Secondary | ICD-10-CM | POA: Diagnosis not present

## 2019-01-30 DIAGNOSIS — F411 Generalized anxiety disorder: Secondary | ICD-10-CM | POA: Diagnosis not present

## 2019-02-07 DIAGNOSIS — M722 Plantar fascial fibromatosis: Secondary | ICD-10-CM | POA: Diagnosis not present

## 2019-02-07 DIAGNOSIS — Z1339 Encounter for screening examination for other mental health and behavioral disorders: Secondary | ICD-10-CM | POA: Diagnosis not present

## 2019-02-07 DIAGNOSIS — Z1331 Encounter for screening for depression: Secondary | ICD-10-CM | POA: Diagnosis not present

## 2019-02-07 DIAGNOSIS — Z0000 Encounter for general adult medical examination without abnormal findings: Secondary | ICD-10-CM | POA: Diagnosis not present

## 2019-02-07 DIAGNOSIS — E291 Testicular hypofunction: Secondary | ICD-10-CM | POA: Diagnosis not present

## 2019-02-07 DIAGNOSIS — F419 Anxiety disorder, unspecified: Secondary | ICD-10-CM | POA: Diagnosis not present

## 2019-02-08 DIAGNOSIS — F411 Generalized anxiety disorder: Secondary | ICD-10-CM | POA: Diagnosis not present

## 2019-02-14 DIAGNOSIS — F411 Generalized anxiety disorder: Secondary | ICD-10-CM | POA: Diagnosis not present

## 2019-02-16 DIAGNOSIS — M67912 Unspecified disorder of synovium and tendon, left shoulder: Secondary | ICD-10-CM | POA: Diagnosis not present

## 2019-02-21 DIAGNOSIS — M25612 Stiffness of left shoulder, not elsewhere classified: Secondary | ICD-10-CM | POA: Diagnosis not present

## 2019-02-21 DIAGNOSIS — M67922 Unspecified disorder of synovium and tendon, left upper arm: Secondary | ICD-10-CM | POA: Diagnosis not present

## 2019-02-21 DIAGNOSIS — M6281 Muscle weakness (generalized): Secondary | ICD-10-CM | POA: Diagnosis not present

## 2019-02-21 DIAGNOSIS — F411 Generalized anxiety disorder: Secondary | ICD-10-CM | POA: Diagnosis not present

## 2019-03-01 DIAGNOSIS — F411 Generalized anxiety disorder: Secondary | ICD-10-CM | POA: Diagnosis not present

## 2019-03-02 DIAGNOSIS — M67922 Unspecified disorder of synovium and tendon, left upper arm: Secondary | ICD-10-CM | POA: Diagnosis not present

## 2019-03-02 DIAGNOSIS — M6281 Muscle weakness (generalized): Secondary | ICD-10-CM | POA: Diagnosis not present

## 2019-03-02 DIAGNOSIS — M25612 Stiffness of left shoulder, not elsewhere classified: Secondary | ICD-10-CM | POA: Diagnosis not present

## 2019-03-06 DIAGNOSIS — M25612 Stiffness of left shoulder, not elsewhere classified: Secondary | ICD-10-CM | POA: Diagnosis not present

## 2019-03-06 DIAGNOSIS — M6281 Muscle weakness (generalized): Secondary | ICD-10-CM | POA: Diagnosis not present

## 2019-03-06 DIAGNOSIS — M67922 Unspecified disorder of synovium and tendon, left upper arm: Secondary | ICD-10-CM | POA: Diagnosis not present

## 2019-03-08 DIAGNOSIS — R030 Elevated blood-pressure reading, without diagnosis of hypertension: Secondary | ICD-10-CM | POA: Diagnosis not present

## 2019-03-13 DIAGNOSIS — F411 Generalized anxiety disorder: Secondary | ICD-10-CM | POA: Diagnosis not present

## 2019-03-13 DIAGNOSIS — R030 Elevated blood-pressure reading, without diagnosis of hypertension: Secondary | ICD-10-CM | POA: Diagnosis not present

## 2019-03-14 DIAGNOSIS — F411 Generalized anxiety disorder: Secondary | ICD-10-CM | POA: Diagnosis not present

## 2019-03-23 DIAGNOSIS — M67922 Unspecified disorder of synovium and tendon, left upper arm: Secondary | ICD-10-CM | POA: Diagnosis not present

## 2019-03-23 DIAGNOSIS — M6281 Muscle weakness (generalized): Secondary | ICD-10-CM | POA: Diagnosis not present

## 2019-03-23 DIAGNOSIS — M25612 Stiffness of left shoulder, not elsewhere classified: Secondary | ICD-10-CM | POA: Diagnosis not present

## 2019-03-23 MED FILL — MELOXICAM 15 MG TABLET: 15 | 60 days supply | Qty: 60 | Fill #0

## 2019-03-29 DIAGNOSIS — M67922 Unspecified disorder of synovium and tendon, left upper arm: Secondary | ICD-10-CM | POA: Diagnosis not present

## 2019-03-29 DIAGNOSIS — M6281 Muscle weakness (generalized): Secondary | ICD-10-CM | POA: Diagnosis not present

## 2019-03-29 DIAGNOSIS — M25612 Stiffness of left shoulder, not elsewhere classified: Secondary | ICD-10-CM | POA: Diagnosis not present

## 2019-04-03 DIAGNOSIS — M67922 Unspecified disorder of synovium and tendon, left upper arm: Secondary | ICD-10-CM | POA: Diagnosis not present

## 2019-04-03 DIAGNOSIS — M25612 Stiffness of left shoulder, not elsewhere classified: Secondary | ICD-10-CM | POA: Diagnosis not present

## 2019-04-03 DIAGNOSIS — M6281 Muscle weakness (generalized): Secondary | ICD-10-CM | POA: Diagnosis not present

## 2019-04-03 DIAGNOSIS — F411 Generalized anxiety disorder: Secondary | ICD-10-CM | POA: Diagnosis not present

## 2019-04-09 ENCOUNTER — Other Ambulatory Visit: Payer: Self-pay | Admitting: Endocrinology

## 2019-04-09 MED FILL — CLOMIPHENE CITRATE 50 MG TA: 50 | 28 days supply | Qty: 3 | Fill #0

## 2019-04-09 NOTE — Telephone Encounter (Signed)
Please refill x 1 F/u is due  

## 2019-04-09 NOTE — Telephone Encounter (Signed)
LOV 03/01/17. No future appt noted. Please advise.

## 2019-04-10 DIAGNOSIS — M6281 Muscle weakness (generalized): Secondary | ICD-10-CM | POA: Diagnosis not present

## 2019-04-10 DIAGNOSIS — M25612 Stiffness of left shoulder, not elsewhere classified: Secondary | ICD-10-CM | POA: Diagnosis not present

## 2019-04-10 DIAGNOSIS — M67922 Unspecified disorder of synovium and tendon, left upper arm: Secondary | ICD-10-CM | POA: Diagnosis not present

## 2019-04-12 DIAGNOSIS — F411 Generalized anxiety disorder: Secondary | ICD-10-CM | POA: Diagnosis not present

## 2019-04-18 DIAGNOSIS — F411 Generalized anxiety disorder: Secondary | ICD-10-CM | POA: Diagnosis not present

## 2019-04-23 DIAGNOSIS — F411 Generalized anxiety disorder: Secondary | ICD-10-CM | POA: Diagnosis not present

## 2019-04-25 ENCOUNTER — Other Ambulatory Visit: Payer: Self-pay | Admitting: Endocrinology

## 2019-04-25 DIAGNOSIS — F411 Generalized anxiety disorder: Secondary | ICD-10-CM | POA: Diagnosis not present

## 2019-04-26 MED ORDER — CLOMIPHENE CITRATE 50 MG PO TABS: ORAL_TABLET | ORAL | 0 refills | Status: DC

## 2019-04-26 NOTE — Telephone Encounter (Signed)
LOV 03/01/17. Per your office note, pt was advised to f/u 1 month. No future appt noted. Please advise.

## 2019-04-30 MED FILL — CLOMIPHENE CITRATE 50 MG TA: 50 | 28 days supply | Qty: 3 | Fill #0

## 2019-05-01 DIAGNOSIS — E291 Testicular hypofunction: Secondary | ICD-10-CM | POA: Diagnosis not present

## 2019-05-01 DIAGNOSIS — Z0000 Encounter for general adult medical examination without abnormal findings: Secondary | ICD-10-CM | POA: Diagnosis not present

## 2019-05-10 DIAGNOSIS — F411 Generalized anxiety disorder: Secondary | ICD-10-CM | POA: Diagnosis not present

## 2019-05-15 DIAGNOSIS — M6281 Muscle weakness (generalized): Secondary | ICD-10-CM | POA: Diagnosis not present

## 2019-05-15 DIAGNOSIS — M67922 Unspecified disorder of synovium and tendon, left upper arm: Secondary | ICD-10-CM | POA: Diagnosis not present

## 2019-05-15 DIAGNOSIS — M25612 Stiffness of left shoulder, not elsewhere classified: Secondary | ICD-10-CM | POA: Diagnosis not present

## 2019-05-17 DIAGNOSIS — F411 Generalized anxiety disorder: Secondary | ICD-10-CM | POA: Diagnosis not present

## 2019-05-17 DIAGNOSIS — M67922 Unspecified disorder of synovium and tendon, left upper arm: Secondary | ICD-10-CM | POA: Diagnosis not present

## 2019-05-17 DIAGNOSIS — M25612 Stiffness of left shoulder, not elsewhere classified: Secondary | ICD-10-CM | POA: Diagnosis not present

## 2019-05-17 DIAGNOSIS — M6281 Muscle weakness (generalized): Secondary | ICD-10-CM | POA: Diagnosis not present

## 2019-05-22 DIAGNOSIS — F411 Generalized anxiety disorder: Secondary | ICD-10-CM | POA: Diagnosis not present

## 2019-05-24 DIAGNOSIS — M67922 Unspecified disorder of synovium and tendon, left upper arm: Secondary | ICD-10-CM | POA: Diagnosis not present

## 2019-05-24 DIAGNOSIS — M6281 Muscle weakness (generalized): Secondary | ICD-10-CM | POA: Diagnosis not present

## 2019-05-24 DIAGNOSIS — M25612 Stiffness of left shoulder, not elsewhere classified: Secondary | ICD-10-CM | POA: Diagnosis not present

## 2019-05-28 ENCOUNTER — Telehealth: Payer: Self-pay

## 2019-05-28 ENCOUNTER — Other Ambulatory Visit: Payer: Self-pay

## 2019-05-28 DIAGNOSIS — R7989 Other specified abnormal findings of blood chemistry: Secondary | ICD-10-CM

## 2019-05-28 MED ORDER — CLOMIPHENE CITRATE 50 MG PO TABS: ORAL_TABLET | ORAL | 0 refills | Status: DC

## 2019-05-28 MED FILL — CLOMIPHENE CITRATE 50 MG TA: 50 | 28 days supply | Qty: 3 | Fill #0

## 2019-05-28 NOTE — Telephone Encounter (Signed)
Per Dr. Cordelia Pen request, please call to schedule VV. Refill sent x1  clomiPHENE (CLOMID) 50 MG tablet 3 tablet 0 05/28/2019    Sig: TAKE 1/4 TABLET BY MOUTH 3 TIMES PER WEEK. MUST CALL TO SCHEDULE APPT   Sent to pharmacy as: clomiPHENE (CLOMID) 50 MG tablet   E-Prescribing Status: Receipt confirmed by pharmacy (05/28/2019 12:44 PM EST)

## 2019-05-28 NOTE — Telephone Encounter (Signed)
Do you want pt to schedule appt 1st then refill x1?

## 2019-05-28 NOTE — Telephone Encounter (Signed)
OK to refill, and advise VV.  Thank you

## 2019-05-28 NOTE — Telephone Encounter (Signed)
Please refill x 1 Ov is due (VV is OK)

## 2019-05-28 NOTE — Telephone Encounter (Signed)
MEDICATION:  clomiPHENE (CLOMID) 50 MG tablet  PHARMACY:  [place] [place] A 90 DAY SUPPLY :   IS PATIENT OUT OF MEDICATION:   IF NOT; HOW MUCH IS LEFT:   LAST APPOINTMENT DATE: @10 /14/2020  NEXT APPOINTMENT DATE:@11 /25/2020  DO WE HAVE YOUR PERMISSION TO LEAVE A DETAILED MESSAGE:  OTHER COMMENTS:    **Let patient know to contact pharmacy at the end of the day to make sure medication is ready. **  ** Please notify patient to allow 48-72 hours to process**  **Encourage patient to contact the pharmacy for refills or they can request refills through [facility]**

## 2019-05-28 NOTE — Telephone Encounter (Signed)
Please advise 

## 2019-05-29 NOTE — Telephone Encounter (Signed)
LMTCB - patient advised that refill was given, but that a follow up would have to scheduled either VV or in person

## 2019-05-30 DIAGNOSIS — F411 Generalized anxiety disorder: Secondary | ICD-10-CM | POA: Diagnosis not present

## 2019-06-04 DIAGNOSIS — F411 Generalized anxiety disorder: Secondary | ICD-10-CM | POA: Diagnosis not present

## 2019-06-05 ENCOUNTER — Encounter: Payer: Self-pay | Admitting: Endocrinology

## 2019-06-05 NOTE — Telephone Encounter (Signed)
Please review labs and advise about request

## 2019-06-06 ENCOUNTER — Ambulatory Visit (INDEPENDENT_AMBULATORY_CARE_PROVIDER_SITE_OTHER): Payer: 59 | Admitting: Endocrinology

## 2019-06-06 ENCOUNTER — Other Ambulatory Visit: Payer: Self-pay

## 2019-06-06 DIAGNOSIS — R7989 Other specified abnormal findings of blood chemistry: Secondary | ICD-10-CM

## 2019-06-06 MED ORDER — CLOMIPHENE CITRATE 50 MG PO TABS: ORAL_TABLET | ORAL | 3 refills | Status: DC

## 2019-06-06 NOTE — Patient Instructions (Signed)
Please continue the same medications Please come back for a follow-up appointment in 1 year.

## 2019-06-06 NOTE — Progress Notes (Addendum)
Subjective:    Patient ID: [name] [name], male    DOB: Nov 18, 1965, 53 y.o.   MRN: [id number]  HPI  telehealth visit today via doxy video visit.  Alternatives to telehealth are presented to this patient, and the patient agrees to the telehealth visit. Pt is advised of the cost of the visit, and agrees to this, also.   Patient is at home, and I am at the office.   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 pt states he feels well in general.  He takes clomid as rx'ed. Past Medical History:  Diagnosis Date  . Allergy   . Anxiety   . Depression   . Diverticulitis   . Obsessive compulsive disorder   . OCD (obsessive compulsive disorder)   . Tear of left glenoid labrum, posterior 05/13/2016    Past Surgical History:  Procedure Laterality Date  . BICEPT TENODESIS Left 05/11/2018   Procedure: BICEPS TENODESIS;  Surgeon: Tania Ade, MD;  Location: [facility];  Service: Orthopedics;  Laterality: Left;  . COLONOSCOPY    . PARTIAL COLECTOMY  2009   for diverticulitis  . SHOULDER ARTHROSCOPY WITH BANKART REPAIR Left 05/13/2016   Procedure: SHOULDER ARTHROSCOPY WITH POSTERIOR BANKART REPAIR with extensive debridement, left bursectomy;  Surgeon: Marchia Bond, MD;  Location: [facility];  Service: Orthopedics;  Laterality: Left;  pre/Post Op Scalene block  . SHOULDER ARTHROSCOPY WITH LABRAL REPAIR Left 05/11/2018   Procedure: LEFT SHOULDER ARTHROSCOPIC WITH LABRAL REPAIR AND BICEPS TENODESIS;  Surgeon: Tania Ade, MD;  Location: [facility];  Service: Orthopedics;  Laterality: Left;  . SHOULDER ARTHROSCOPY WITH SUBACROMIAL DECOMPRESSION Left 05/11/2018   Procedure: SHOULDER ARTHROSCOPY WITH SUBACROMIAL DECOMPRESSION;  Surgeon: Tania Ade, MD;  Location: [facility];  Service:  Orthopedics;  Laterality: Left;  . WISDOM TOOTH EXTRACTION  1988    Social History   Socioeconomic History  . Marital status: Married    Spouse name: Not on file  . Number of children: Not on file  . Years of education: Not on file  . Highest education level: Not on file  Occupational History  . Not on file  Social Needs  . Financial resource strain: Not on file  . Food insecurity    Worry: Not on file    Inability: Not on file  . Transportation needs    Medical: Not on file    Non-medical: Not on file  Tobacco Use  . Smoking status: Never Smoker  . Smokeless tobacco: Never Used  Substance and Sexual Activity  . Alcohol use: Yes    Alcohol/week: 4.0 standard drinks    Types: 4 Cans of beer per week  . Drug use: No  . Sexual activity: Not on file  Lifestyle  . Physical activity    Days per week: Not on file    Minutes per session: Not on file  . Stress: Not on file  Relationships  . Social Herbalist on phone: Not on file    Gets together: Not on file    Attends religious service: Not on file    Active member of club or organization: Not on file    Attends meetings of clubs or organizations: Not on file    Relationship status: Not on file  . Intimate partner violence    Fear  of current or ex partner: Not on file    Emotionally abused: Not on file    Physically abused: Not on file    Forced sexual activity: Not on file  Other Topics Concern  . Not on file  Social History Narrative  . Not on file    Current Outpatient Medications on File Prior to Visit  Medication Sig Dispense Refill  . Ascorbic Acid (VITAMIN C) 1000 MG tablet Take 1,000 mg by mouth daily.    . Cholecalciferol (VITAMIN D PO) Take 25,000 mcg by mouth daily.    . clonazePAM (KLONOPIN) 1 MG tablet Take 1 mg by mouth at bedtime.     . Cyanocobalamin (VITAMIN B-12 PO) Take 2,000 mcg by mouth daily.    . Multiple Vitamin (MULTIVITAMIN) tablet Take 1 tablet by mouth daily.     No current  facility-administered medications on file prior to visit.     Allergies  Allergen Reactions  . Dextromethorphan Hives and Swelling  . Hydrocodone-Acetaminophen Hives    Hydrocodone - not hydrocodone-acetaminophen    Family History  Problem Relation Age of Onset  . Colon cancer Brother        < 24  . Esophageal cancer Neg Hx   . Rectal cancer Neg Hx   . Stomach cancer Neg Hx     There were no vitals taken for this visit.  Review of Systems Denies decreased urinary stream.     Objective:   Physical Exam   outside test results are reviewed: testosterone =513 PSA=normal    Assessment & Plan:  Low testosterone: well-controlled.    Patient Instructions  Please continue the same medications Please come back for a follow-up appointment in 1 year.

## 2019-06-12 DIAGNOSIS — H5203 Hypermetropia, bilateral: Secondary | ICD-10-CM | POA: Diagnosis not present

## 2019-06-12 DIAGNOSIS — H52223 Regular astigmatism, bilateral: Secondary | ICD-10-CM | POA: Diagnosis not present

## 2019-06-12 DIAGNOSIS — H524 Presbyopia: Secondary | ICD-10-CM | POA: Diagnosis not present

## 2019-06-14 DIAGNOSIS — F411 Generalized anxiety disorder: Secondary | ICD-10-CM | POA: Diagnosis not present

## 2019-06-15 DIAGNOSIS — M25612 Stiffness of left shoulder, not elsewhere classified: Secondary | ICD-10-CM | POA: Diagnosis not present

## 2019-06-15 DIAGNOSIS — M6281 Muscle weakness (generalized): Secondary | ICD-10-CM | POA: Diagnosis not present

## 2019-06-15 DIAGNOSIS — M67922 Unspecified disorder of synovium and tendon, left upper arm: Secondary | ICD-10-CM | POA: Diagnosis not present

## 2019-06-19 DIAGNOSIS — M25612 Stiffness of left shoulder, not elsewhere classified: Secondary | ICD-10-CM | POA: Diagnosis not present

## 2019-06-19 DIAGNOSIS — M67922 Unspecified disorder of synovium and tendon, left upper arm: Secondary | ICD-10-CM | POA: Diagnosis not present

## 2019-06-19 DIAGNOSIS — M6281 Muscle weakness (generalized): Secondary | ICD-10-CM | POA: Diagnosis not present

## 2019-06-19 MED FILL — CLOMIPHENE CITRATE 50 MG TA: 50 | 84 days supply | Qty: 9 | Fill #0

## 2019-06-20 DIAGNOSIS — F411 Generalized anxiety disorder: Secondary | ICD-10-CM | POA: Diagnosis not present

## 2019-06-27 DIAGNOSIS — F411 Generalized anxiety disorder: Secondary | ICD-10-CM | POA: Diagnosis not present

## 2019-06-28 DIAGNOSIS — F411 Generalized anxiety disorder: Secondary | ICD-10-CM | POA: Diagnosis not present

## 2019-07-03 DIAGNOSIS — F411 Generalized anxiety disorder: Secondary | ICD-10-CM | POA: Diagnosis not present

## 2019-07-04 DIAGNOSIS — Z1159 Encounter for screening for other viral diseases: Secondary | ICD-10-CM | POA: Diagnosis not present

## 2019-07-04 MED FILL — clonazePAM 1 MG TABS: 1 | 90 days supply | Qty: 180 | Fill #0

## 2019-07-11 DIAGNOSIS — F411 Generalized anxiety disorder: Secondary | ICD-10-CM | POA: Diagnosis not present

## 2019-07-18 DIAGNOSIS — F411 Generalized anxiety disorder: Secondary | ICD-10-CM | POA: Diagnosis not present

## 2019-07-19 DIAGNOSIS — D485 Neoplasm of uncertain behavior of skin: Secondary | ICD-10-CM | POA: Diagnosis not present

## 2019-07-19 DIAGNOSIS — D225 Melanocytic nevi of trunk: Secondary | ICD-10-CM | POA: Diagnosis not present

## 2019-07-19 DIAGNOSIS — D2271 Melanocytic nevi of right lower limb, including hip: Secondary | ICD-10-CM | POA: Diagnosis not present

## 2019-07-30 DIAGNOSIS — F411 Generalized anxiety disorder: Secondary | ICD-10-CM | POA: Diagnosis not present

## 2019-07-31 DIAGNOSIS — F411 Generalized anxiety disorder: Secondary | ICD-10-CM | POA: Diagnosis not present

## 2019-08-15 DIAGNOSIS — F411 Generalized anxiety disorder: Secondary | ICD-10-CM | POA: Diagnosis not present

## 2019-08-30 DIAGNOSIS — F411 Generalized anxiety disorder: Secondary | ICD-10-CM | POA: Diagnosis not present

## 2019-09-05 DIAGNOSIS — F411 Generalized anxiety disorder: Secondary | ICD-10-CM | POA: Diagnosis not present

## 2019-09-14 ENCOUNTER — Other Ambulatory Visit: Payer: Self-pay

## 2019-09-14 DIAGNOSIS — R7989 Other specified abnormal findings of blood chemistry: Secondary | ICD-10-CM

## 2019-09-14 MED ORDER — CLOMIPHENE CITRATE 50 MG PO TABS: ORAL_TABLET | ORAL | 3 refills | Status: DC

## 2019-09-14 MED FILL — CLOMIPHENE CITRATE 50 MG TA: 50 | 84 days supply | Qty: 9 | Fill #1

## 2019-09-26 DIAGNOSIS — F411 Generalized anxiety disorder: Secondary | ICD-10-CM | POA: Diagnosis not present

## 2019-10-05 ENCOUNTER — Other Ambulatory Visit: Payer: Self-pay

## 2019-10-05 ENCOUNTER — Ambulatory Visit (INDEPENDENT_AMBULATORY_CARE_PROVIDER_SITE_OTHER): Payer: 59 | Admitting: Family Medicine

## 2019-10-05 VITALS — BP 108/68 | Ht 70.0 in | Wt 162.0 lb

## 2019-10-05 DIAGNOSIS — M533 Sacrococcygeal disorders, not elsewhere classified: Secondary | ICD-10-CM | POA: Diagnosis not present

## 2019-10-05 DIAGNOSIS — M545 Low back pain, unspecified: Secondary | ICD-10-CM | POA: Insufficient documentation

## 2019-10-05 DIAGNOSIS — G8929 Other chronic pain: Secondary | ICD-10-CM | POA: Diagnosis not present

## 2019-10-05 NOTE — Assessment & Plan Note (Addendum)
I suspect he has chronic muscle tightness from hamstrings all the way up through the lower back and pelvis.  We will set him up with PT. also gave him some information on Pilates as I think that might help his lack of mobility in his low back and trunk.  He can follow-up with [place] as needed.  He has previously tried orthotics and did not really think they were very helpful.  I did tell him to change his shoes out before they got has worn as they are today.

## 2019-10-05 NOTE — Progress Notes (Signed)
  Unkown Defronzo - 54 y.o. male MRN [id number]  Date of birth: 02-24-1966    SUBJECTIVE:      Chief Complaint:/ HPI:  Low back/buttock pain.  Has had this off and on for about 2 years.  Sometimes it is better than others.  Over the last month or so, since he ran 30 days in a row in December, he has had increasing pain.  To the low back.  Aches.  Does not radiate down into the lower extremity and he has no numbness no giving way, no bowel or bladder incontinence.  He is also having some of his pain when he sits for a long period of time or when he stands.  FH colon cancer in brother at age 8s.Brother deceased.  OBJECTIVE: BP 108/68   Ht 5\' 10"  (1.778 m)   Wt 162 lb (73.5 kg)   BMI 23.24 kg/m   Physical Exam:  Vital signs are reviewed. GENERAL: Well-developed male no acute distress Back: A Little Stiff in Hyperextension in This Causes Some Mild Discomfort.  He Can Bend over to Touch His Toes but Cannot Touch the Floor with His Knee Straight.  Mildly Tender to Palpation in the Paravertebral Muscles LS Spine and down into Bilateral SI Joint Area.  There Is A Lot Of Stiffness Here. EXTREMITY: Normal Lower Extremity Strength 5 out of 5 Symmetrical Hip Knee and Ankle. Gait: Toe out Gait.  Running Gait Trunk Forward, High Knees, Toe out Bilaterally Right Greater Than Left.  Shoe Wear on the Lateral Portion of the Heel Is Fairly Significant.  ASSESSMENT & PLAN:  See problem based charting & AVS for pt instructions. Chronic bilateral low back pain without sciatica I suspect he has chronic muscle tightness from hamstrings all the way up through the lower back and pelvis.  We will set him up with PT. also gave him some information on Pilates as I think that might help his lack of mobility in his low back and trunk.  He can follow-up with [place] as needed.  He has previously tried orthotics and did not really think they were very helpful.  I did tell him to change his shoes out before they got has worn  as they are today.  Sacroiliac joint dysfunction of both sides He has family history of early colon cancer.  Much of his pain is in the SI joint area.  We discussed and decided to get some x-rays.

## 2019-10-05 NOTE — Assessment & Plan Note (Signed)
He has family history of early colon cancer.  Much of his pain is in the SI joint area.  We discussed and decided to get some x-rays.

## 2019-10-10 DIAGNOSIS — R269 Unspecified abnormalities of gait and mobility: Secondary | ICD-10-CM | POA: Diagnosis not present

## 2019-10-10 DIAGNOSIS — F411 Generalized anxiety disorder: Secondary | ICD-10-CM | POA: Diagnosis not present

## 2019-10-10 DIAGNOSIS — M545 Low back pain: Secondary | ICD-10-CM | POA: Diagnosis not present

## 2019-10-16 DIAGNOSIS — R269 Unspecified abnormalities of gait and mobility: Secondary | ICD-10-CM | POA: Diagnosis not present

## 2019-10-16 DIAGNOSIS — M545 Low back pain: Secondary | ICD-10-CM | POA: Diagnosis not present

## 2019-10-19 DIAGNOSIS — R269 Unspecified abnormalities of gait and mobility: Secondary | ICD-10-CM | POA: Diagnosis not present

## 2019-10-19 DIAGNOSIS — M545 Low back pain: Secondary | ICD-10-CM | POA: Diagnosis not present

## 2019-10-23 ENCOUNTER — Other Ambulatory Visit: Payer: Self-pay | Admitting: Family Medicine

## 2019-10-23 DIAGNOSIS — M545 Low back pain: Secondary | ICD-10-CM | POA: Diagnosis not present

## 2019-10-23 DIAGNOSIS — R269 Unspecified abnormalities of gait and mobility: Secondary | ICD-10-CM | POA: Diagnosis not present

## 2019-10-23 MED ORDER — PREDNISONE 20 MG PO TABS: ORAL_TABLET | ORAL | 0 refills | Status: DC

## 2019-10-24 DIAGNOSIS — F411 Generalized anxiety disorder: Secondary | ICD-10-CM | POA: Diagnosis not present

## 2019-10-25 ENCOUNTER — Other Ambulatory Visit: Payer: Self-pay | Admitting: Family Medicine

## 2019-10-25 DIAGNOSIS — R269 Unspecified abnormalities of gait and mobility: Secondary | ICD-10-CM | POA: Diagnosis not present

## 2019-10-25 DIAGNOSIS — M545 Low back pain: Secondary | ICD-10-CM | POA: Diagnosis not present

## 2019-10-25 MED ORDER — PREDNISONE 20 MG PO TABS: ORAL_TABLET | ORAL | 0 refills | Status: DC

## 2019-10-25 MED FILL — predniSONE 20 MG TABS: 20 | 3 days supply | Qty: 6 | Fill #0

## 2019-10-30 DIAGNOSIS — R269 Unspecified abnormalities of gait and mobility: Secondary | ICD-10-CM | POA: Diagnosis not present

## 2019-10-30 DIAGNOSIS — M545 Low back pain: Secondary | ICD-10-CM | POA: Diagnosis not present

## 2019-11-01 DIAGNOSIS — M545 Low back pain: Secondary | ICD-10-CM | POA: Diagnosis not present

## 2019-11-01 DIAGNOSIS — R269 Unspecified abnormalities of gait and mobility: Secondary | ICD-10-CM | POA: Diagnosis not present

## 2019-11-05 DIAGNOSIS — F411 Generalized anxiety disorder: Secondary | ICD-10-CM | POA: Diagnosis not present

## 2019-11-06 DIAGNOSIS — R269 Unspecified abnormalities of gait and mobility: Secondary | ICD-10-CM | POA: Diagnosis not present

## 2019-11-06 DIAGNOSIS — M545 Low back pain: Secondary | ICD-10-CM | POA: Diagnosis not present

## 2019-11-08 DIAGNOSIS — R269 Unspecified abnormalities of gait and mobility: Secondary | ICD-10-CM | POA: Diagnosis not present

## 2019-11-08 DIAGNOSIS — M545 Low back pain: Secondary | ICD-10-CM | POA: Diagnosis not present

## 2019-11-11 ENCOUNTER — Other Ambulatory Visit: Payer: Self-pay | Admitting: Family Medicine

## 2019-11-11 MED ORDER — TRAMADOL HCL 50 MG PO TABS: 50.0000 mg | ORAL_TABLET | Freq: Four times a day (QID) | ORAL | 0 refills | Status: DC | PRN

## 2019-11-11 NOTE — Progress Notes (Signed)
Pt contacted me via text. Back pain much worse despite PT and pilates. Has not been running. Has been standing at desk and pain is progressing. Today he is having trouble ambulating and getting out of bed secondary to pain. He has not gotten plain X rays yet. He agrees to get them Monday. At this point, I am concerned some disc pathology or similar may be happening so we will likely need MRI which I can order after I see plain films. He is on call the next two weeks and walking around hospital may increase pain so we will try low dose tramadol which he has taken once before without problem. He is aware of potential sedation side effects and will use accordingly.

## 2019-11-12 ENCOUNTER — Ambulatory Visit
Admission: RE | Admit: 2019-11-12 | Discharge: 2019-11-12 | Disposition: A | Discharge status: Home / Self Care | Payer: 59 | Source: Ambulatory Visit | Attending: Family Medicine | Admitting: Family Medicine

## 2019-11-12 ENCOUNTER — Other Ambulatory Visit: Payer: Self-pay

## 2019-11-12 DIAGNOSIS — M533 Sacrococcygeal disorders, not elsewhere classified: Secondary | ICD-10-CM

## 2019-11-12 DIAGNOSIS — M545 Low back pain: Secondary | ICD-10-CM

## 2019-11-12 DIAGNOSIS — M5136 Other intervertebral disc degeneration, lumbar region: Secondary | ICD-10-CM | POA: Diagnosis not present

## 2019-11-12 DIAGNOSIS — G8929 Other chronic pain: Secondary | ICD-10-CM

## 2019-11-12 DIAGNOSIS — M5127 Other intervertebral disc displacement, lumbosacral region: Secondary | ICD-10-CM | POA: Diagnosis not present

## 2019-11-12 MED FILL — clonazePAM 1 MG TABS: 1 | 90 days supply | Qty: 180 | Fill #1

## 2019-11-15 DIAGNOSIS — M47816 Spondylosis without myelopathy or radiculopathy, lumbar region: Secondary | ICD-10-CM | POA: Diagnosis not present

## 2019-11-15 DIAGNOSIS — M5136 Other intervertebral disc degeneration, lumbar region: Secondary | ICD-10-CM | POA: Diagnosis not present

## 2019-11-15 MED FILL — GABAPENTIN 300 MG CAPSULE: 300 | 30 days supply | Qty: 60 | Fill #0

## 2019-11-17 ENCOUNTER — Ambulatory Visit (HOSPITAL_BASED_OUTPATIENT_CLINIC_OR_DEPARTMENT_OTHER): Payer: 59

## 2019-11-19 DIAGNOSIS — M5416 Radiculopathy, lumbar region: Secondary | ICD-10-CM | POA: Diagnosis not present

## 2019-11-19 DIAGNOSIS — M545 Low back pain: Secondary | ICD-10-CM | POA: Diagnosis not present

## 2019-11-19 DIAGNOSIS — M5126 Other intervertebral disc displacement, lumbar region: Secondary | ICD-10-CM | POA: Diagnosis not present

## 2019-11-19 DIAGNOSIS — M5136 Other intervertebral disc degeneration, lumbar region: Secondary | ICD-10-CM | POA: Diagnosis not present

## 2019-12-04 DIAGNOSIS — F411 Generalized anxiety disorder: Secondary | ICD-10-CM | POA: Diagnosis not present

## 2019-12-06 DIAGNOSIS — M532X7 Spinal instabilities, lumbosacral region: Secondary | ICD-10-CM | POA: Diagnosis not present

## 2019-12-07 MED FILL — CLOMIPHENE CITRATE 50 MG TA: 50 | 84 days supply | Qty: 9 | Fill #2

## 2019-12-11 DIAGNOSIS — M532X7 Spinal instabilities, lumbosacral region: Secondary | ICD-10-CM | POA: Diagnosis not present

## 2019-12-12 MED FILL — CLOMIPHENE CITRATE 50 MG TA: 50 | 84 days supply | Qty: 9 | Fill #3

## 2019-12-13 DIAGNOSIS — M532X7 Spinal instabilities, lumbosacral region: Secondary | ICD-10-CM | POA: Diagnosis not present

## 2019-12-18 DIAGNOSIS — M532X7 Spinal instabilities, lumbosacral region: Secondary | ICD-10-CM | POA: Diagnosis not present

## 2019-12-19 DIAGNOSIS — M532X7 Spinal instabilities, lumbosacral region: Secondary | ICD-10-CM | POA: Diagnosis not present

## 2019-12-20 DIAGNOSIS — M532X7 Spinal instabilities, lumbosacral region: Secondary | ICD-10-CM | POA: Diagnosis not present

## 2019-12-20 DIAGNOSIS — F411 Generalized anxiety disorder: Secondary | ICD-10-CM | POA: Diagnosis not present

## 2019-12-25 DIAGNOSIS — M532X7 Spinal instabilities, lumbosacral region: Secondary | ICD-10-CM | POA: Diagnosis not present

## 2019-12-25 DIAGNOSIS — M5136 Other intervertebral disc degeneration, lumbar region: Secondary | ICD-10-CM | POA: Diagnosis not present

## 2019-12-27 DIAGNOSIS — M532X7 Spinal instabilities, lumbosacral region: Secondary | ICD-10-CM | POA: Diagnosis not present

## 2019-12-28 DIAGNOSIS — M532X7 Spinal instabilities, lumbosacral region: Secondary | ICD-10-CM | POA: Diagnosis not present

## 2020-01-01 DIAGNOSIS — M532X7 Spinal instabilities, lumbosacral region: Secondary | ICD-10-CM | POA: Diagnosis not present

## 2020-01-02 DIAGNOSIS — E291 Testicular hypofunction: Secondary | ICD-10-CM | POA: Diagnosis not present

## 2020-01-02 DIAGNOSIS — R82998 Other abnormal findings in urine: Secondary | ICD-10-CM | POA: Diagnosis not present

## 2020-01-02 DIAGNOSIS — Z125 Encounter for screening for malignant neoplasm of prostate: Secondary | ICD-10-CM | POA: Diagnosis not present

## 2020-01-02 DIAGNOSIS — M722 Plantar fascial fibromatosis: Secondary | ICD-10-CM | POA: Diagnosis not present

## 2020-01-02 DIAGNOSIS — M532X7 Spinal instabilities, lumbosacral region: Secondary | ICD-10-CM | POA: Diagnosis not present

## 2020-01-02 DIAGNOSIS — Z0000 Encounter for general adult medical examination without abnormal findings: Secondary | ICD-10-CM | POA: Diagnosis not present

## 2020-01-02 DIAGNOSIS — Z1331 Encounter for screening for depression: Secondary | ICD-10-CM | POA: Diagnosis not present

## 2020-01-02 DIAGNOSIS — F419 Anxiety disorder, unspecified: Secondary | ICD-10-CM | POA: Diagnosis not present

## 2020-01-03 DIAGNOSIS — F411 Generalized anxiety disorder: Secondary | ICD-10-CM | POA: Diagnosis not present

## 2020-01-04 DIAGNOSIS — M532X7 Spinal instabilities, lumbosacral region: Secondary | ICD-10-CM | POA: Diagnosis not present

## 2020-01-07 DIAGNOSIS — D539 Nutritional anemia, unspecified: Secondary | ICD-10-CM | POA: Diagnosis not present

## 2020-01-08 DIAGNOSIS — M532X7 Spinal instabilities, lumbosacral region: Secondary | ICD-10-CM | POA: Diagnosis not present

## 2020-01-09 DIAGNOSIS — Z3189 Encounter for other procreative management: Secondary | ICD-10-CM | POA: Diagnosis not present

## 2020-01-10 DIAGNOSIS — M532X7 Spinal instabilities, lumbosacral region: Secondary | ICD-10-CM | POA: Diagnosis not present

## 2020-01-11 DIAGNOSIS — M532X7 Spinal instabilities, lumbosacral region: Secondary | ICD-10-CM | POA: Diagnosis not present

## 2020-01-15 MED FILL — TIVICAY 50 MG TABLET: 50 | 23 days supply | Qty: 23 | Fill #0

## 2020-01-15 MED FILL — DESCOVY 200-25 MG TABS: 200-25 | 23 days supply | Qty: 23 | Fill #0

## 2020-01-16 DIAGNOSIS — M532X7 Spinal instabilities, lumbosacral region: Secondary | ICD-10-CM | POA: Diagnosis not present

## 2020-01-17 DIAGNOSIS — M532X7 Spinal instabilities, lumbosacral region: Secondary | ICD-10-CM | POA: Diagnosis not present

## 2020-01-18 DIAGNOSIS — M532X7 Spinal instabilities, lumbosacral region: Secondary | ICD-10-CM | POA: Diagnosis not present

## 2020-01-29 DIAGNOSIS — M532X7 Spinal instabilities, lumbosacral region: Secondary | ICD-10-CM | POA: Diagnosis not present

## 2020-01-31 DIAGNOSIS — M532X7 Spinal instabilities, lumbosacral region: Secondary | ICD-10-CM | POA: Diagnosis not present

## 2020-02-01 DIAGNOSIS — M532X7 Spinal instabilities, lumbosacral region: Secondary | ICD-10-CM | POA: Diagnosis not present

## 2020-02-04 DIAGNOSIS — M532X7 Spinal instabilities, lumbosacral region: Secondary | ICD-10-CM | POA: Diagnosis not present

## 2020-02-05 DIAGNOSIS — M532X7 Spinal instabilities, lumbosacral region: Secondary | ICD-10-CM | POA: Diagnosis not present

## 2020-02-06 DIAGNOSIS — F411 Generalized anxiety disorder: Secondary | ICD-10-CM | POA: Diagnosis not present

## 2020-02-06 MED FILL — DULOXETINE HCL 20 MG CPEP: 20 | 25 days supply | Qty: 60 | Fill #0

## 2020-02-07 DIAGNOSIS — M532X7 Spinal instabilities, lumbosacral region: Secondary | ICD-10-CM | POA: Diagnosis not present

## 2020-02-08 MED FILL — CLONAZEPAM 1 MG TABS: 1 | 90 days supply | Qty: 180 | Fill #0

## 2020-02-11 DIAGNOSIS — M532X7 Spinal instabilities, lumbosacral region: Secondary | ICD-10-CM | POA: Diagnosis not present

## 2020-02-13 DIAGNOSIS — M532X7 Spinal instabilities, lumbosacral region: Secondary | ICD-10-CM | POA: Diagnosis not present

## 2020-02-15 DIAGNOSIS — M532X7 Spinal instabilities, lumbosacral region: Secondary | ICD-10-CM | POA: Diagnosis not present

## 2020-02-15 MED FILL — DULOXETINE HCL 20 MG CPEP: 20 | 25 days supply | Qty: 60 | Fill #0

## 2020-02-19 DIAGNOSIS — M532X7 Spinal instabilities, lumbosacral region: Secondary | ICD-10-CM | POA: Diagnosis not present

## 2020-02-21 DIAGNOSIS — F411 Generalized anxiety disorder: Secondary | ICD-10-CM | POA: Diagnosis not present

## 2020-02-21 DIAGNOSIS — M532X7 Spinal instabilities, lumbosacral region: Secondary | ICD-10-CM | POA: Diagnosis not present

## 2020-02-26 DIAGNOSIS — M532X7 Spinal instabilities, lumbosacral region: Secondary | ICD-10-CM | POA: Diagnosis not present

## 2020-02-28 DIAGNOSIS — M532X7 Spinal instabilities, lumbosacral region: Secondary | ICD-10-CM | POA: Diagnosis not present

## 2020-03-03 DIAGNOSIS — M545 Low back pain: Secondary | ICD-10-CM | POA: Diagnosis not present

## 2020-03-05 DIAGNOSIS — M532X7 Spinal instabilities, lumbosacral region: Secondary | ICD-10-CM | POA: Diagnosis not present

## 2020-03-06 DIAGNOSIS — F411 Generalized anxiety disorder: Secondary | ICD-10-CM | POA: Diagnosis not present

## 2020-03-11 DIAGNOSIS — M9902 Segmental and somatic dysfunction of thoracic region: Secondary | ICD-10-CM | POA: Diagnosis not present

## 2020-03-11 DIAGNOSIS — M5441 Lumbago with sciatica, right side: Secondary | ICD-10-CM | POA: Diagnosis not present

## 2020-03-11 DIAGNOSIS — M9901 Segmental and somatic dysfunction of cervical region: Secondary | ICD-10-CM | POA: Diagnosis not present

## 2020-03-11 DIAGNOSIS — M532X7 Spinal instabilities, lumbosacral region: Secondary | ICD-10-CM | POA: Diagnosis not present

## 2020-03-11 DIAGNOSIS — M9903 Segmental and somatic dysfunction of lumbar region: Secondary | ICD-10-CM | POA: Diagnosis not present

## 2020-03-13 DIAGNOSIS — M532X7 Spinal instabilities, lumbosacral region: Secondary | ICD-10-CM | POA: Diagnosis not present

## 2020-03-18 DIAGNOSIS — M532X7 Spinal instabilities, lumbosacral region: Secondary | ICD-10-CM | POA: Diagnosis not present

## 2020-03-20 DIAGNOSIS — M9901 Segmental and somatic dysfunction of cervical region: Secondary | ICD-10-CM | POA: Diagnosis not present

## 2020-03-20 DIAGNOSIS — M9902 Segmental and somatic dysfunction of thoracic region: Secondary | ICD-10-CM | POA: Diagnosis not present

## 2020-03-20 DIAGNOSIS — M5441 Lumbago with sciatica, right side: Secondary | ICD-10-CM | POA: Diagnosis not present

## 2020-03-20 DIAGNOSIS — M9903 Segmental and somatic dysfunction of lumbar region: Secondary | ICD-10-CM | POA: Diagnosis not present

## 2020-03-25 DIAGNOSIS — M532X7 Spinal instabilities, lumbosacral region: Secondary | ICD-10-CM | POA: Diagnosis not present

## 2020-03-27 DIAGNOSIS — F411 Generalized anxiety disorder: Secondary | ICD-10-CM | POA: Diagnosis not present

## 2020-03-28 DIAGNOSIS — M532X7 Spinal instabilities, lumbosacral region: Secondary | ICD-10-CM | POA: Diagnosis not present

## 2020-04-01 DIAGNOSIS — M532X7 Spinal instabilities, lumbosacral region: Secondary | ICD-10-CM | POA: Diagnosis not present

## 2020-04-03 DIAGNOSIS — M532X7 Spinal instabilities, lumbosacral region: Secondary | ICD-10-CM | POA: Diagnosis not present

## 2020-04-07 DIAGNOSIS — M532X7 Spinal instabilities, lumbosacral region: Secondary | ICD-10-CM | POA: Diagnosis not present

## 2020-04-07 DIAGNOSIS — M5441 Lumbago with sciatica, right side: Secondary | ICD-10-CM | POA: Diagnosis not present

## 2020-04-07 DIAGNOSIS — M9901 Segmental and somatic dysfunction of cervical region: Secondary | ICD-10-CM | POA: Diagnosis not present

## 2020-04-07 DIAGNOSIS — M9903 Segmental and somatic dysfunction of lumbar region: Secondary | ICD-10-CM | POA: Diagnosis not present

## 2020-04-07 DIAGNOSIS — M9902 Segmental and somatic dysfunction of thoracic region: Secondary | ICD-10-CM | POA: Diagnosis not present

## 2020-04-08 DIAGNOSIS — M532X7 Spinal instabilities, lumbosacral region: Secondary | ICD-10-CM | POA: Diagnosis not present

## 2020-04-09 DIAGNOSIS — M532X7 Spinal instabilities, lumbosacral region: Secondary | ICD-10-CM | POA: Diagnosis not present

## 2020-04-10 DIAGNOSIS — M532X7 Spinal instabilities, lumbosacral region: Secondary | ICD-10-CM | POA: Diagnosis not present

## 2020-04-14 DIAGNOSIS — M532X7 Spinal instabilities, lumbosacral region: Secondary | ICD-10-CM | POA: Diagnosis not present

## 2020-04-15 DIAGNOSIS — M9902 Segmental and somatic dysfunction of thoracic region: Secondary | ICD-10-CM | POA: Diagnosis not present

## 2020-04-15 DIAGNOSIS — M532X7 Spinal instabilities, lumbosacral region: Secondary | ICD-10-CM | POA: Diagnosis not present

## 2020-04-15 DIAGNOSIS — M9903 Segmental and somatic dysfunction of lumbar region: Secondary | ICD-10-CM | POA: Diagnosis not present

## 2020-04-15 DIAGNOSIS — M5441 Lumbago with sciatica, right side: Secondary | ICD-10-CM | POA: Diagnosis not present

## 2020-04-15 DIAGNOSIS — M9901 Segmental and somatic dysfunction of cervical region: Secondary | ICD-10-CM | POA: Diagnosis not present

## 2020-04-16 DIAGNOSIS — M532X7 Spinal instabilities, lumbosacral region: Secondary | ICD-10-CM | POA: Diagnosis not present

## 2020-04-16 DIAGNOSIS — F411 Generalized anxiety disorder: Secondary | ICD-10-CM | POA: Diagnosis not present

## 2020-04-17 DIAGNOSIS — M532X7 Spinal instabilities, lumbosacral region: Secondary | ICD-10-CM | POA: Diagnosis not present

## 2020-04-18 DIAGNOSIS — M532X7 Spinal instabilities, lumbosacral region: Secondary | ICD-10-CM | POA: Diagnosis not present

## 2020-04-21 ENCOUNTER — Other Ambulatory Visit (HOSPITAL_COMMUNITY): Payer: Self-pay | Admitting: Internal Medicine

## 2020-04-21 DIAGNOSIS — M532X7 Spinal instabilities, lumbosacral region: Secondary | ICD-10-CM | POA: Diagnosis not present

## 2020-04-21 MED FILL — tiZANidine HCL 4 MG TABS: 4 | 15 days supply | Qty: 45 | Fill #0

## 2020-04-24 DIAGNOSIS — M9903 Segmental and somatic dysfunction of lumbar region: Secondary | ICD-10-CM | POA: Diagnosis not present

## 2020-04-24 DIAGNOSIS — M5441 Lumbago with sciatica, right side: Secondary | ICD-10-CM | POA: Diagnosis not present

## 2020-04-24 DIAGNOSIS — M9902 Segmental and somatic dysfunction of thoracic region: Secondary | ICD-10-CM | POA: Diagnosis not present

## 2020-04-24 DIAGNOSIS — M9901 Segmental and somatic dysfunction of cervical region: Secondary | ICD-10-CM | POA: Diagnosis not present

## 2020-04-25 DIAGNOSIS — M532X7 Spinal instabilities, lumbosacral region: Secondary | ICD-10-CM | POA: Diagnosis not present

## 2020-04-28 ENCOUNTER — Other Ambulatory Visit: Payer: Self-pay | Admitting: Family Medicine

## 2020-04-28 DIAGNOSIS — G8929 Other chronic pain: Secondary | ICD-10-CM

## 2020-04-29 ENCOUNTER — Ambulatory Visit
Admission: RE | Admit: 2020-04-29 | Discharge: 2020-04-29 | Disposition: A | Discharge status: Home / Self Care | Payer: 59 | Source: Ambulatory Visit | Attending: Family Medicine | Admitting: Family Medicine

## 2020-04-29 ENCOUNTER — Other Ambulatory Visit: Payer: Self-pay | Admitting: Family Medicine

## 2020-04-29 DIAGNOSIS — G8929 Other chronic pain: Secondary | ICD-10-CM

## 2020-04-29 DIAGNOSIS — M532X7 Spinal instabilities, lumbosacral region: Secondary | ICD-10-CM | POA: Diagnosis not present

## 2020-04-29 DIAGNOSIS — M4184 Other forms of scoliosis, thoracic region: Secondary | ICD-10-CM | POA: Diagnosis not present

## 2020-04-29 DIAGNOSIS — M47814 Spondylosis without myelopathy or radiculopathy, thoracic region: Secondary | ICD-10-CM | POA: Diagnosis not present

## 2020-04-29 NOTE — Progress Notes (Signed)
Sent results thru [facility]

## 2020-05-01 DIAGNOSIS — M9902 Segmental and somatic dysfunction of thoracic region: Secondary | ICD-10-CM | POA: Diagnosis not present

## 2020-05-01 DIAGNOSIS — M9901 Segmental and somatic dysfunction of cervical region: Secondary | ICD-10-CM | POA: Diagnosis not present

## 2020-05-01 DIAGNOSIS — M9903 Segmental and somatic dysfunction of lumbar region: Secondary | ICD-10-CM | POA: Diagnosis not present

## 2020-05-01 DIAGNOSIS — M5441 Lumbago with sciatica, right side: Secondary | ICD-10-CM | POA: Diagnosis not present

## 2020-05-06 DIAGNOSIS — M9902 Segmental and somatic dysfunction of thoracic region: Secondary | ICD-10-CM | POA: Diagnosis not present

## 2020-05-06 DIAGNOSIS — M5441 Lumbago with sciatica, right side: Secondary | ICD-10-CM | POA: Diagnosis not present

## 2020-05-06 DIAGNOSIS — M9903 Segmental and somatic dysfunction of lumbar region: Secondary | ICD-10-CM | POA: Diagnosis not present

## 2020-05-06 DIAGNOSIS — M9901 Segmental and somatic dysfunction of cervical region: Secondary | ICD-10-CM | POA: Diagnosis not present

## 2020-05-08 DIAGNOSIS — F411 Generalized anxiety disorder: Secondary | ICD-10-CM | POA: Diagnosis not present

## 2020-05-09 ENCOUNTER — Other Ambulatory Visit (HOSPITAL_COMMUNITY): Payer: Self-pay | Admitting: Psychiatry

## 2020-05-09 DIAGNOSIS — F411 Generalized anxiety disorder: Secondary | ICD-10-CM | POA: Diagnosis not present

## 2020-05-09 MED FILL — CLONAZEPAM 1 MG TABS: 1 | 90 days supply | Qty: 180 | Fill #0

## 2020-05-13 DIAGNOSIS — M9902 Segmental and somatic dysfunction of thoracic region: Secondary | ICD-10-CM | POA: Diagnosis not present

## 2020-05-13 DIAGNOSIS — M9901 Segmental and somatic dysfunction of cervical region: Secondary | ICD-10-CM | POA: Diagnosis not present

## 2020-05-13 DIAGNOSIS — M9903 Segmental and somatic dysfunction of lumbar region: Secondary | ICD-10-CM | POA: Diagnosis not present

## 2020-05-13 DIAGNOSIS — M5441 Lumbago with sciatica, right side: Secondary | ICD-10-CM | POA: Diagnosis not present

## 2020-05-16 DIAGNOSIS — M532X7 Spinal instabilities, lumbosacral region: Secondary | ICD-10-CM | POA: Diagnosis not present

## 2020-05-21 DIAGNOSIS — M532X7 Spinal instabilities, lumbosacral region: Secondary | ICD-10-CM | POA: Diagnosis not present

## 2020-05-22 DIAGNOSIS — M532X7 Spinal instabilities, lumbosacral region: Secondary | ICD-10-CM | POA: Diagnosis not present

## 2020-05-29 DIAGNOSIS — M9902 Segmental and somatic dysfunction of thoracic region: Secondary | ICD-10-CM | POA: Diagnosis not present

## 2020-05-29 DIAGNOSIS — M9901 Segmental and somatic dysfunction of cervical region: Secondary | ICD-10-CM | POA: Diagnosis not present

## 2020-05-29 DIAGNOSIS — F411 Generalized anxiety disorder: Secondary | ICD-10-CM | POA: Diagnosis not present

## 2020-05-29 DIAGNOSIS — M5441 Lumbago with sciatica, right side: Secondary | ICD-10-CM | POA: Diagnosis not present

## 2020-05-29 DIAGNOSIS — M9903 Segmental and somatic dysfunction of lumbar region: Secondary | ICD-10-CM | POA: Diagnosis not present

## 2020-06-09 MED FILL — CLOMIPHENE CITRATE 50 MG TA: 50 | 84 days supply | Qty: 9 | Fill #0

## 2020-06-12 DIAGNOSIS — F411 Generalized anxiety disorder: Secondary | ICD-10-CM | POA: Diagnosis not present

## 2020-06-13 ENCOUNTER — Other Ambulatory Visit: Payer: Self-pay | Admitting: Family Medicine

## 2020-06-13 MED ORDER — NITROGLYCERIN 0.2 MG/HR TD PT24
MEDICATED_PATCH | TRANSDERMAL | 1 refills | Status: DC
Start: 2020-06-13 — End: 2020-06-13

## 2020-06-13 MED FILL — NITROGLYCERIN 0.2 MG/HR PTC: 0.2 | 88 days supply | Qty: 22 | Fill #0

## 2020-06-13 NOTE — Progress Notes (Signed)
Wants to try NTG patch for hamstring strain which I think is reasonable. No hx migraines, no CVdz. Dorcas Mcmurray

## 2020-06-18 DIAGNOSIS — M9903 Segmental and somatic dysfunction of lumbar region: Secondary | ICD-10-CM | POA: Diagnosis not present

## 2020-06-18 DIAGNOSIS — M9902 Segmental and somatic dysfunction of thoracic region: Secondary | ICD-10-CM | POA: Diagnosis not present

## 2020-06-18 DIAGNOSIS — M5441 Lumbago with sciatica, right side: Secondary | ICD-10-CM | POA: Diagnosis not present

## 2020-06-18 DIAGNOSIS — M9901 Segmental and somatic dysfunction of cervical region: Secondary | ICD-10-CM | POA: Diagnosis not present

## 2020-06-25 DIAGNOSIS — Z1152 Encounter for screening for COVID-19: Secondary | ICD-10-CM | POA: Diagnosis not present

## 2020-06-25 DIAGNOSIS — M5441 Lumbago with sciatica, right side: Secondary | ICD-10-CM | POA: Diagnosis not present

## 2020-06-25 DIAGNOSIS — F411 Generalized anxiety disorder: Secondary | ICD-10-CM | POA: Diagnosis not present

## 2020-06-25 DIAGNOSIS — M9901 Segmental and somatic dysfunction of cervical region: Secondary | ICD-10-CM | POA: Diagnosis not present

## 2020-06-25 DIAGNOSIS — M9902 Segmental and somatic dysfunction of thoracic region: Secondary | ICD-10-CM | POA: Diagnosis not present

## 2020-06-25 DIAGNOSIS — M9903 Segmental and somatic dysfunction of lumbar region: Secondary | ICD-10-CM | POA: Diagnosis not present

## 2020-07-15 DIAGNOSIS — M9903 Segmental and somatic dysfunction of lumbar region: Secondary | ICD-10-CM | POA: Diagnosis not present

## 2020-07-15 DIAGNOSIS — M9901 Segmental and somatic dysfunction of cervical region: Secondary | ICD-10-CM | POA: Diagnosis not present

## 2020-07-15 DIAGNOSIS — M9902 Segmental and somatic dysfunction of thoracic region: Secondary | ICD-10-CM | POA: Diagnosis not present

## 2020-07-15 DIAGNOSIS — M5441 Lumbago with sciatica, right side: Secondary | ICD-10-CM | POA: Diagnosis not present

## 2020-07-17 DIAGNOSIS — M532X7 Spinal instabilities, lumbosacral region: Secondary | ICD-10-CM | POA: Diagnosis not present

## 2020-07-17 DIAGNOSIS — Z516 Encounter for desensitization to allergens: Secondary | ICD-10-CM | POA: Diagnosis not present

## 2020-07-22 DIAGNOSIS — M532X7 Spinal instabilities, lumbosacral region: Secondary | ICD-10-CM | POA: Diagnosis not present

## 2020-07-25 DIAGNOSIS — M532X7 Spinal instabilities, lumbosacral region: Secondary | ICD-10-CM | POA: Diagnosis not present

## 2020-07-29 DIAGNOSIS — M9903 Segmental and somatic dysfunction of lumbar region: Secondary | ICD-10-CM | POA: Diagnosis not present

## 2020-07-29 DIAGNOSIS — M9902 Segmental and somatic dysfunction of thoracic region: Secondary | ICD-10-CM | POA: Diagnosis not present

## 2020-07-29 DIAGNOSIS — M5441 Lumbago with sciatica, right side: Secondary | ICD-10-CM | POA: Diagnosis not present

## 2020-07-29 DIAGNOSIS — M9901 Segmental and somatic dysfunction of cervical region: Secondary | ICD-10-CM | POA: Diagnosis not present

## 2020-07-31 DIAGNOSIS — M532X7 Spinal instabilities, lumbosacral region: Secondary | ICD-10-CM | POA: Diagnosis not present

## 2020-08-07 DIAGNOSIS — M532X7 Spinal instabilities, lumbosacral region: Secondary | ICD-10-CM | POA: Diagnosis not present

## 2020-08-12 ENCOUNTER — Other Ambulatory Visit (HOSPITAL_COMMUNITY): Payer: Self-pay | Admitting: Psychiatry

## 2020-08-12 DIAGNOSIS — H5203 Hypermetropia, bilateral: Secondary | ICD-10-CM | POA: Diagnosis not present

## 2020-08-12 DIAGNOSIS — H524 Presbyopia: Secondary | ICD-10-CM | POA: Diagnosis not present

## 2020-08-12 DIAGNOSIS — H52223 Regular astigmatism, bilateral: Secondary | ICD-10-CM | POA: Diagnosis not present

## 2020-08-12 MED FILL — CLONAZEPAM 1 MG TABS: 1 | 90 days supply | Qty: 180 | Fill #0

## 2020-08-14 DIAGNOSIS — M9902 Segmental and somatic dysfunction of thoracic region: Secondary | ICD-10-CM | POA: Diagnosis not present

## 2020-08-14 DIAGNOSIS — M9903 Segmental and somatic dysfunction of lumbar region: Secondary | ICD-10-CM | POA: Diagnosis not present

## 2020-08-14 DIAGNOSIS — M5441 Lumbago with sciatica, right side: Secondary | ICD-10-CM | POA: Diagnosis not present

## 2020-08-14 DIAGNOSIS — M9901 Segmental and somatic dysfunction of cervical region: Secondary | ICD-10-CM | POA: Diagnosis not present

## 2020-08-15 DIAGNOSIS — M532X7 Spinal instabilities, lumbosacral region: Secondary | ICD-10-CM | POA: Diagnosis not present

## 2020-08-19 DIAGNOSIS — F411 Generalized anxiety disorder: Secondary | ICD-10-CM | POA: Diagnosis not present

## 2020-08-22 DIAGNOSIS — M532X7 Spinal instabilities, lumbosacral region: Secondary | ICD-10-CM | POA: Diagnosis not present

## 2020-09-03 DIAGNOSIS — M532X7 Spinal instabilities, lumbosacral region: Secondary | ICD-10-CM | POA: Diagnosis not present

## 2020-09-11 MED FILL — CLOMIPHENE CITRATE 50 MG TA: 50 | 84 days supply | Qty: 9 | Fill #1

## 2020-09-12 DIAGNOSIS — M532X7 Spinal instabilities, lumbosacral region: Secondary | ICD-10-CM | POA: Diagnosis not present

## 2020-09-17 DIAGNOSIS — M532X7 Spinal instabilities, lumbosacral region: Secondary | ICD-10-CM | POA: Diagnosis not present

## 2020-09-18 DIAGNOSIS — F411 Generalized anxiety disorder: Secondary | ICD-10-CM | POA: Diagnosis not present

## 2020-09-22 DIAGNOSIS — M532X7 Spinal instabilities, lumbosacral region: Secondary | ICD-10-CM | POA: Diagnosis not present

## 2020-10-07 DIAGNOSIS — F411 Generalized anxiety disorder: Secondary | ICD-10-CM | POA: Diagnosis not present

## 2020-10-09 DIAGNOSIS — M532X7 Spinal instabilities, lumbosacral region: Secondary | ICD-10-CM | POA: Diagnosis not present

## 2020-10-10 DIAGNOSIS — M532X7 Spinal instabilities, lumbosacral region: Secondary | ICD-10-CM | POA: Diagnosis not present

## 2020-10-20 DIAGNOSIS — M532X7 Spinal instabilities, lumbosacral region: Secondary | ICD-10-CM | POA: Diagnosis not present

## 2020-10-24 DIAGNOSIS — M532X7 Spinal instabilities, lumbosacral region: Secondary | ICD-10-CM | POA: Diagnosis not present

## 2020-10-27 DIAGNOSIS — M532X7 Spinal instabilities, lumbosacral region: Secondary | ICD-10-CM | POA: Diagnosis not present

## 2020-10-29 DIAGNOSIS — F411 Generalized anxiety disorder: Secondary | ICD-10-CM | POA: Diagnosis not present

## 2020-11-17 ENCOUNTER — Other Ambulatory Visit: Payer: Self-pay | Admitting: Endocrinology

## 2020-11-17 ENCOUNTER — Other Ambulatory Visit (HOSPITAL_COMMUNITY): Payer: Self-pay

## 2020-11-17 DIAGNOSIS — R7989 Other specified abnormal findings of blood chemistry: Secondary | ICD-10-CM

## 2020-11-17 MED ORDER — CLONAZEPAM 1 MG PO TABS
ORAL_TABLET | ORAL | 1 refills | Status: AC
  Filled 2020-11-17: qty 180, 90d supply, fill #0
  Filled 2021-02-25: qty 180, 90d supply, fill #1

## 2020-11-18 ENCOUNTER — Other Ambulatory Visit (HOSPITAL_COMMUNITY): Payer: Self-pay

## 2020-11-25 ENCOUNTER — Other Ambulatory Visit (HOSPITAL_COMMUNITY): Payer: Self-pay

## 2020-11-25 DIAGNOSIS — M532X7 Spinal instabilities, lumbosacral region: Secondary | ICD-10-CM | POA: Diagnosis not present

## 2020-12-04 DIAGNOSIS — F411 Generalized anxiety disorder: Secondary | ICD-10-CM | POA: Diagnosis not present

## 2020-12-04 DIAGNOSIS — M532X7 Spinal instabilities, lumbosacral region: Secondary | ICD-10-CM | POA: Diagnosis not present

## 2020-12-09 ENCOUNTER — Other Ambulatory Visit (HOSPITAL_COMMUNITY): Payer: Self-pay

## 2020-12-09 ENCOUNTER — Other Ambulatory Visit: Payer: Self-pay | Admitting: Endocrinology

## 2020-12-09 DIAGNOSIS — R7989 Other specified abnormal findings of blood chemistry: Secondary | ICD-10-CM

## 2020-12-10 ENCOUNTER — Other Ambulatory Visit: Payer: Self-pay | Admitting: Endocrinology

## 2020-12-10 ENCOUNTER — Encounter: Payer: Self-pay | Admitting: Endocrinology

## 2020-12-10 ENCOUNTER — Other Ambulatory Visit (HOSPITAL_COMMUNITY): Payer: Self-pay

## 2020-12-10 DIAGNOSIS — R7989 Other specified abnormal findings of blood chemistry: Secondary | ICD-10-CM

## 2020-12-10 MED ORDER — CLOMIPHENE CITRATE 50 MG PO TABS: ORAL_TABLET | ORAL | 3 refills | Status: DC

## 2020-12-10 MED ORDER — CLOMIPHENE CITRATE 50 MG PO TABS
ORAL_TABLET | ORAL | 3 refills | Status: DC
  Filled 2020-12-10: qty 9, 84d supply, fill #0
  Filled 2021-04-01: qty 9, 84d supply, fill #1
  Filled 2021-07-29 – 2021-08-06 (×2): qty 9, 84d supply, fill #2

## 2020-12-11 ENCOUNTER — Other Ambulatory Visit (HOSPITAL_COMMUNITY): Payer: Self-pay

## 2020-12-11 DIAGNOSIS — M532X7 Spinal instabilities, lumbosacral region: Secondary | ICD-10-CM | POA: Diagnosis not present

## 2020-12-11 MED ORDER — CARESTART COVID-19 HOME TEST VI KIT
PACK | 0 refills | Status: DC
  Filled 2020-12-11: qty 4, 4d supply, fill #0

## 2020-12-12 ENCOUNTER — Other Ambulatory Visit: Payer: Self-pay

## 2020-12-16 DIAGNOSIS — M532X7 Spinal instabilities, lumbosacral region: Secondary | ICD-10-CM | POA: Diagnosis not present

## 2020-12-23 DIAGNOSIS — M532X7 Spinal instabilities, lumbosacral region: Secondary | ICD-10-CM | POA: Diagnosis not present

## 2021-01-13 DIAGNOSIS — Z125 Encounter for screening for malignant neoplasm of prostate: Secondary | ICD-10-CM | POA: Diagnosis not present

## 2021-01-13 DIAGNOSIS — D539 Nutritional anemia, unspecified: Secondary | ICD-10-CM | POA: Diagnosis not present

## 2021-01-13 DIAGNOSIS — F419 Anxiety disorder, unspecified: Secondary | ICD-10-CM | POA: Diagnosis not present

## 2021-01-13 DIAGNOSIS — E785 Hyperlipidemia, unspecified: Secondary | ICD-10-CM | POA: Diagnosis not present

## 2021-01-13 DIAGNOSIS — R030 Elevated blood-pressure reading, without diagnosis of hypertension: Secondary | ICD-10-CM | POA: Diagnosis not present

## 2021-01-13 DIAGNOSIS — Z0000 Encounter for general adult medical examination without abnormal findings: Secondary | ICD-10-CM | POA: Diagnosis not present

## 2021-01-13 DIAGNOSIS — R82998 Other abnormal findings in urine: Secondary | ICD-10-CM | POA: Diagnosis not present

## 2021-01-13 DIAGNOSIS — M722 Plantar fascial fibromatosis: Secondary | ICD-10-CM | POA: Diagnosis not present

## 2021-01-13 DIAGNOSIS — R52 Pain, unspecified: Secondary | ICD-10-CM | POA: Diagnosis not present

## 2021-01-13 DIAGNOSIS — Z1331 Encounter for screening for depression: Secondary | ICD-10-CM | POA: Diagnosis not present

## 2021-01-13 DIAGNOSIS — Z1389 Encounter for screening for other disorder: Secondary | ICD-10-CM | POA: Diagnosis not present

## 2021-01-13 DIAGNOSIS — E291 Testicular hypofunction: Secondary | ICD-10-CM | POA: Diagnosis not present

## 2021-01-20 DIAGNOSIS — M25551 Pain in right hip: Secondary | ICD-10-CM | POA: Diagnosis not present

## 2021-01-20 DIAGNOSIS — R2689 Other abnormalities of gait and mobility: Secondary | ICD-10-CM | POA: Diagnosis not present

## 2021-01-20 DIAGNOSIS — M545 Low back pain, unspecified: Secondary | ICD-10-CM | POA: Diagnosis not present

## 2021-01-21 ENCOUNTER — Other Ambulatory Visit: Payer: Self-pay | Admitting: Internal Medicine

## 2021-01-21 DIAGNOSIS — E785 Hyperlipidemia, unspecified: Secondary | ICD-10-CM

## 2021-01-27 DIAGNOSIS — M545 Low back pain, unspecified: Secondary | ICD-10-CM | POA: Diagnosis not present

## 2021-01-29 DIAGNOSIS — M25551 Pain in right hip: Secondary | ICD-10-CM | POA: Diagnosis not present

## 2021-01-29 DIAGNOSIS — M545 Low back pain, unspecified: Secondary | ICD-10-CM | POA: Diagnosis not present

## 2021-01-29 DIAGNOSIS — R2689 Other abnormalities of gait and mobility: Secondary | ICD-10-CM | POA: Diagnosis not present

## 2021-02-04 DIAGNOSIS — R2689 Other abnormalities of gait and mobility: Secondary | ICD-10-CM | POA: Diagnosis not present

## 2021-02-04 DIAGNOSIS — M545 Low back pain, unspecified: Secondary | ICD-10-CM | POA: Diagnosis not present

## 2021-02-04 DIAGNOSIS — M25551 Pain in right hip: Secondary | ICD-10-CM | POA: Diagnosis not present

## 2021-02-07 ENCOUNTER — Other Ambulatory Visit (HOSPITAL_COMMUNITY): Payer: Self-pay | Admitting: Pharmacist

## 2021-02-09 ENCOUNTER — Other Ambulatory Visit (HOSPITAL_COMMUNITY): Payer: Self-pay

## 2021-02-13 DIAGNOSIS — M545 Low back pain, unspecified: Secondary | ICD-10-CM | POA: Diagnosis not present

## 2021-02-13 DIAGNOSIS — M25551 Pain in right hip: Secondary | ICD-10-CM | POA: Diagnosis not present

## 2021-02-13 DIAGNOSIS — R2689 Other abnormalities of gait and mobility: Secondary | ICD-10-CM | POA: Diagnosis not present

## 2021-02-24 ENCOUNTER — Ambulatory Visit
Admission: RE | Admit: 2021-02-24 | Discharge: 2021-02-24 | Disposition: A | Discharge status: Home / Self Care | Payer: No Typology Code available for payment source | Source: Ambulatory Visit | Attending: Internal Medicine | Admitting: Internal Medicine

## 2021-02-24 DIAGNOSIS — E785 Hyperlipidemia, unspecified: Secondary | ICD-10-CM

## 2021-02-26 ENCOUNTER — Other Ambulatory Visit (HOSPITAL_COMMUNITY): Payer: Self-pay

## 2021-02-27 ENCOUNTER — Other Ambulatory Visit (HOSPITAL_COMMUNITY): Payer: Self-pay

## 2021-03-03 DIAGNOSIS — R2689 Other abnormalities of gait and mobility: Secondary | ICD-10-CM | POA: Diagnosis not present

## 2021-03-03 DIAGNOSIS — M25551 Pain in right hip: Secondary | ICD-10-CM | POA: Diagnosis not present

## 2021-03-03 DIAGNOSIS — M545 Low back pain, unspecified: Secondary | ICD-10-CM | POA: Diagnosis not present

## 2021-03-10 ENCOUNTER — Other Ambulatory Visit (HOSPITAL_COMMUNITY): Payer: Self-pay

## 2021-03-10 ENCOUNTER — Ambulatory Visit (INDEPENDENT_AMBULATORY_CARE_PROVIDER_SITE_OTHER): Payer: 59 | Admitting: Family Medicine

## 2021-03-10 ENCOUNTER — Other Ambulatory Visit: Payer: Self-pay

## 2021-03-10 ENCOUNTER — Encounter: Payer: Self-pay | Admitting: Family Medicine

## 2021-03-10 VITALS — BP 122/84 | HR 68 | Ht 70.0 in | Wt 178.0 lb

## 2021-03-10 DIAGNOSIS — M533 Sacrococcygeal disorders, not elsewhere classified: Secondary | ICD-10-CM | POA: Diagnosis not present

## 2021-03-10 DIAGNOSIS — G8929 Other chronic pain: Secondary | ICD-10-CM | POA: Diagnosis not present

## 2021-03-10 DIAGNOSIS — M5416 Radiculopathy, lumbar region: Secondary | ICD-10-CM

## 2021-03-10 DIAGNOSIS — M9902 Segmental and somatic dysfunction of thoracic region: Secondary | ICD-10-CM

## 2021-03-10 DIAGNOSIS — M9904 Segmental and somatic dysfunction of sacral region: Secondary | ICD-10-CM | POA: Diagnosis not present

## 2021-03-10 DIAGNOSIS — M545 Low back pain, unspecified: Secondary | ICD-10-CM

## 2021-03-10 DIAGNOSIS — M9903 Segmental and somatic dysfunction of lumbar region: Secondary | ICD-10-CM | POA: Diagnosis not present

## 2021-03-10 DIAGNOSIS — M9901 Segmental and somatic dysfunction of cervical region: Secondary | ICD-10-CM

## 2021-03-10 MED ORDER — GABAPENTIN 100 MG PO CAPS
200.0000 mg | ORAL_CAPSULE | Freq: Every day | ORAL | 0 refills | Status: DC
Start: 2021-03-10 — End: 2021-12-01
  Filled 2021-03-10: qty 180, 90d supply, fill #0

## 2021-03-10 NOTE — Assessment & Plan Note (Signed)
Manipulation today.  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  We will try facet injection.  If continuing to have pain can be a candidate for possible sacroiliac injections as well.  Started on gabapentin and I think patient will do relatively well with discussed other over-the-counter medications.  Follow-up with me again in 4 to 8 weeks.

## 2021-03-10 NOTE — Progress Notes (Signed)
[place] Phone: [phone] Subjective:   [name] [name], am serving as a scribe for Dr. Hulan Saas. This visit occurred during the SARS-CoV-2 public health emergency.  Safety protocols were in place, including screening questions prior to the visit, additional usage of staff PPE, and extensive cleaning of exam room while observing appropriate contact time as indicated for disinfecting solutions.   I'm seeing this patient by the request  of:  Crist Infante, MD  CC: back and piriformis   [id number]  [name] [name] is a 55 y.o. male coming in with complaint of back pain that radiates into the thoracic spine bilatreally. Has had 2 shoulder surgeries. Has also had B piriformis pain that would worsen with sitting. Patient does PT which has helped. Running has been helping and hurting depending on the day. Patient uses LAX ball to help with his pain. Also tried epidural injection which did not help at all. Denies any radiating symptoms into LE.   Patient did have an MRI of the lumbar spine done in May 2021.  This was independently visualized by me showing the patient did have pain with disc protrusion noted at L5-S1 causing a left-sided L5 nerve root impingement.  Patient also had facet arthropathy at L5 and S1.     Past Medical History:  Diagnosis Date   Allergy    Anxiety    Depression    Diverticulitis    Obsessive compulsive disorder    OCD (obsessive compulsive disorder)    Tear of left glenoid labrum, posterior 05/13/2016   Past Surgical History:  Procedure Laterality Date   BICEPT TENODESIS Left 05/11/2018   Procedure: BICEPS TENODESIS;  Surgeon: Tania Ade, MD;  Location: [facility];  Service: Orthopedics;  Laterality: Left;   COLONOSCOPY     PARTIAL COLECTOMY  2009   for diverticulitis   SHOULDER ARTHROSCOPY WITH BANKART REPAIR Left 05/13/2016   Procedure: SHOULDER  ARTHROSCOPY WITH POSTERIOR BANKART REPAIR with extensive debridement, left bursectomy;  Surgeon: Marchia Bond, MD;  Location: [facility];  Service: Orthopedics;  Laterality: Left;  pre/Post Op [place] block   SHOULDER ARTHROSCOPY WITH LABRAL REPAIR Left 05/11/2018   Procedure: LEFT SHOULDER ARTHROSCOPIC WITH LABRAL REPAIR AND BICEPS TENODESIS;  Surgeon: Tania Ade, MD;  Location: [facility];  Service: Orthopedics;  Laterality: Left;   SHOULDER ARTHROSCOPY WITH SUBACROMIAL DECOMPRESSION Left 05/11/2018   Procedure: SHOULDER ARTHROSCOPY WITH SUBACROMIAL DECOMPRESSION;  Surgeon: Tania Ade, MD;  Location: [facility];  Service: Orthopedics;  Laterality: Left;   WISDOM TOOTH EXTRACTION  1988   Social History   Socioeconomic History   Marital status: Divorced    Spouse name: Not on file   Number of children: Not on file   Years of education: Not on file   Highest education level: Not on file  Occupational History   Not on file  Tobacco Use   Smoking status: Never   Smokeless tobacco: Never  Vaping Use   Vaping Use: Never used  Substance and Sexual Activity   Alcohol use: Yes    Alcohol/week: 4.0 standard drinks    Types: 4 Cans of beer per week   Drug use: [name]   Sexual activity: Not on file  Other Topics Concern   Not on file  Social History Narrative   Not on file   Social Determinants of Health   Financial Resource Strain: Not on file  Food Insecurity: Not on file  Transportation Needs: Not on file  Physical Activity: Not on file  Stress: Not on file  Social Connections: Not on file   Allergies  Allergen Reactions   Dextromethorphan Hives and Swelling   Hydrocodone-Acetaminophen Hives    Hydrocodone - not hydrocodone-acetaminophen   Family History  Problem Relation Age of Onset   Colon cancer Brother        < 60   Esophageal cancer Neg Hx    Rectal cancer Neg Hx    Stomach cancer Neg Hx     Current  Outpatient Medications (Endocrine & Metabolic):    clomiPHENE (CLOMID) 50 MG tablet, TAKE 1/4 TABLET BY MOUTH 3 TIMES A WEEK   predniSONE (DELTASONE) 20 MG tablet, Take 2 tabs by mouth daily with food for 3 days  Current Outpatient Medications (Cardiovascular):    nitroGLYCERIN (NITRODUR - DOSED IN MG/24 HR) 0.2 mg/hr patch, PLACE 1/4 PATCH OVER AFFECTED AREA ONCE A DAY AS DIRECTED.   Current Outpatient Medications (Analgesics):    traMADol (ULTRAM) 50 MG tablet, Take 1 tablet (50 mg total) by mouth every 6 (six) hours as needed for moderate pain.  Current Outpatient Medications (Hematological):    Cyanocobalamin (VITAMIN B-12 PO), Take 2,000 mcg by mouth daily.  Current Outpatient Medications (Other):    Ascorbic Acid (VITAMIN C) 1000 MG tablet, Take 1,000 mg by mouth daily.   Cholecalciferol (VITAMIN D PO), Take 25,000 mcg by mouth daily.   clonazePAM (KLONOPIN) 1 MG tablet, Take 1 mg by mouth at bedtime.    clonazePAM (KLONOPIN) 1 MG tablet, Take 1 and 1/2 tablets by mouth at bedtime and 1/2 tablet during the day if needed.   COVID-19 At Home Antigen Test [facility] COVID-19 HOME TEST) KIT, Use as directed   gabapentin (NEURONTIN) 100 MG capsule, Take 2 capsules by mouth at bedtime.   Multiple Vitamin (MULTIVITAMIN) tablet, Take 1 tablet by mouth daily.   tiZANidine (ZANAFLEX) 4 MG tablet, TAKE 1 TABLET BY MOUTH UP TO 3 TIMES DAILY AS NEEDED FOR MUSCLE PAIN OR SPASMS   clonazePAM (KLONOPIN) 1 MG tablet, TAKE 1 AND 1/2 TABLETS BY MOUTH AT BEDTIME AND TAKE 1/2 TABLET BY MOUTH DURING THE DAY AS NEEDED..   clonazePAM (KLONOPIN) 1 MG tablet, TAKE 1 AND 1/2 TABLETS BY MOUTH AT BEDTIME AND TAKE 1/2 TABLET BY MOUTH DURING THE DAY AS NEEDED.   Reviewed prior external information including notes and imaging from  primary care provider As well as notes that were available from care everywhere and other healthcare systems.  Past medical history, social, surgical and family history all reviewed  in electronic medical record.  [name] pertanent information unless stated regarding to the chief complaint.   Review of Systems:  [name] headache, visual changes, nausea, vomiting, diarrhea, constipation, dizziness, abdominal pain, skin rash, fevers, chills, night sweats, weight loss, swollen lymph nodes, body aches, joint swelling, chest pain, shortness of breath, mood changes. POSITIVE muscle aches  Objective  Blood pressure 122/84, pulse 68, height '5\' 10"'  (1.778 m), weight 178 lb (80.7 kg), SpO2 98 %.   General: [name] apparent distress alert and oriented x3 mood and affect normal, dressed appropriately.  HEENT: Pupils equal, extraocular movements intact  Respiratory: Patient's speak in full sentences and does not appear short of breath  Cardiovascular: [name] lower extremity edema, non tender, [name] erythema  Gait normal with good balance and coordination.  MSK:  Low back exam shows the patient does have significant loss of lordosis.  Significant tightness of the hamstring in the piriformis right greater than left.  Has negative radicular symptoms or with straight leg test.  Worsening pain with extension noted.  Tender to palpation more on the right greater than the left.  Osteopathic findings C3 flexed rotated and side bent left T5 extended rotated and side bent left L1 flexed rotated and side bent right Sacrum right on right     Impression and Recommendations:     The above documentation has been reviewed and is accurate and complete Lyndal Pulley, DO

## 2021-03-10 NOTE — Patient Instructions (Signed)
Gabapentin '200mg'$  at night Tart Cherry Extract '1200mg'$  at night Ice 20 min 2x a day Do machines more than free weights [facility] for facet injections: [phone] See me again in 4-6 weeks

## 2021-03-10 NOTE — Assessment & Plan Note (Signed)
   Decision today to treat with OMT was based on Physical Exam  After verbal consent patient was treated with HVLA, ME, FPR techniques in cervical, thoracic,  lumbar and sacral areas, all areas are chronic   Patient tolerated the procedure well with improvement in symptoms  Patient given exercises, stretches and lifestyle modifications  See medications in patient instructions if given  Patient will follow up in 4-8 weeks 

## 2021-03-12 ENCOUNTER — Other Ambulatory Visit (HOSPITAL_COMMUNITY): Payer: Self-pay

## 2021-03-12 DIAGNOSIS — M25551 Pain in right hip: Secondary | ICD-10-CM | POA: Diagnosis not present

## 2021-03-12 DIAGNOSIS — F411 Generalized anxiety disorder: Secondary | ICD-10-CM | POA: Diagnosis not present

## 2021-03-12 DIAGNOSIS — R2689 Other abnormalities of gait and mobility: Secondary | ICD-10-CM | POA: Diagnosis not present

## 2021-03-12 DIAGNOSIS — M545 Low back pain, unspecified: Secondary | ICD-10-CM | POA: Diagnosis not present

## 2021-03-12 MED ORDER — DULOXETINE HCL 20 MG PO CPEP
ORAL_CAPSULE | ORAL | 0 refills | Status: DC
  Filled 2021-03-12: qty 60, 27d supply, fill #0

## 2021-03-19 DIAGNOSIS — M545 Low back pain, unspecified: Secondary | ICD-10-CM | POA: Diagnosis not present

## 2021-03-19 DIAGNOSIS — M25551 Pain in right hip: Secondary | ICD-10-CM | POA: Diagnosis not present

## 2021-03-19 DIAGNOSIS — R2689 Other abnormalities of gait and mobility: Secondary | ICD-10-CM | POA: Diagnosis not present

## 2021-03-26 ENCOUNTER — Ambulatory Visit
Admission: RE | Admit: 2021-03-26 | Discharge: 2021-03-26 | Disposition: A | Discharge status: Home / Self Care | Payer: 59 | Source: Ambulatory Visit | Attending: Family Medicine | Admitting: Family Medicine

## 2021-03-26 ENCOUNTER — Other Ambulatory Visit: Payer: Self-pay

## 2021-03-26 DIAGNOSIS — M5416 Radiculopathy, lumbar region: Secondary | ICD-10-CM

## 2021-03-26 DIAGNOSIS — M47817 Spondylosis without myelopathy or radiculopathy, lumbosacral region: Secondary | ICD-10-CM | POA: Diagnosis not present

## 2021-03-26 MED ORDER — METHYLPREDNISOLONE ACETATE 40 MG/ML INJ SUSP (RADIOLOG
80.0000 mg | Freq: Once | INTRAMUSCULAR | Status: AC
  Administered 2021-03-26: 80 mg via INTRA_ARTICULAR

## 2021-03-26 MED ORDER — IOPAMIDOL (ISOVUE-M 200) INJECTION 41%
1.0000 mL | Freq: Once | INTRAMUSCULAR | Status: AC
  Administered 2021-03-26: 1 mL via INTRA_ARTICULAR

## 2021-03-26 NOTE — Discharge Instructions (Signed)

## 2021-04-02 ENCOUNTER — Other Ambulatory Visit (HOSPITAL_COMMUNITY): Payer: Self-pay

## 2021-04-02 DIAGNOSIS — F411 Generalized anxiety disorder: Secondary | ICD-10-CM | POA: Diagnosis not present

## 2021-04-08 ENCOUNTER — Other Ambulatory Visit (HOSPITAL_COMMUNITY): Payer: Self-pay

## 2021-04-08 MED ORDER — PAXLOVID (300/100) 20 X 150 MG & 10 X 100MG PO TBPK
ORAL_TABLET | ORAL | 0 refills | Status: DC
  Filled 2021-04-08: qty 30, 5d supply, fill #0

## 2021-04-08 MED ORDER — PAXLOVID (300/100) 20 X 150 MG & 10 X 100MG PO TBPK: ORAL_TABLET | ORAL | 0 refills | Status: DC

## 2021-04-09 NOTE — Progress Notes (Deleted)
  [name] [name] Phone: [phone] Subjective:    I'm seeing this patient by the request  of:  Crist Infante, MD  CC:   [id number]  [name] [name] is a 55 y.o. male coming in with complaint of back and neck pain. OMT on 03/10/2021. Patient states   Medications patient has been prescribed: Gabapentin  Taking:         Reviewed prior external information including notes and imaging from previsou exam, outside providers and external EMR if available.   As well as notes that were available from care everywhere and other healthcare systems.  Past medical history, social, surgical and family history all reviewed in electronic medical record.  No pertanent information unless stated regarding to the chief complaint.   Past Medical History:  Diagnosis Date   Allergy    Anxiety    Depression    Diverticulitis    Obsessive compulsive disorder    OCD (obsessive compulsive disorder)    Tear of left glenoid labrum, posterior 05/13/2016    Allergies  Allergen Reactions   Dextromethorphan Hives and Swelling   Hydrocodone-Acetaminophen Hives    Hydrocodone - not hydrocodone-acetaminophen     Review of Systems:  No headache, visual changes, nausea, vomiting, diarrhea, constipation, dizziness, abdominal pain, skin rash, fevers, chills, night sweats, weight loss, swollen lymph nodes, body aches, joint swelling, chest pain, shortness of breath, mood changes. POSITIVE muscle aches  Objective  There were no vitals taken for this visit.   General: No apparent distress alert and oriented x3 mood and affect normal, dressed appropriately.  HEENT: Pupils equal, extraocular movements intact  Respiratory: Patient's speak in full sentences and does not appear short of breath  Cardiovascular: No lower extremity edema, non tender, no erythema  Neuro: Cranial nerves II through XII are intact, neurovascularly intact in all  extremities with 2+ DTRs and 2+ pulses.  Gait normal with good balance and coordination.  MSK:  Non tender with full range of motion and good stability and symmetric strength and tone of shoulders, elbows, wrist, hip, knee and ankles bilaterally.  Back - Normal skin, Spine with normal alignment and no deformity.  No tenderness to vertebral process palpation.  Paraspinous muscles are not tender and without spasm.   Range of motion is full at neck and lumbar sacral regions  Osteopathic findings  C2 flexed rotated and side bent right C6 flexed rotated and side bent left T3 extended rotated and side bent right inhaled rib T9 extended rotated and side bent left L2 flexed rotated and side bent right Sacrum right on right       Assessment and Plan:    Nonallopathic problems  Decision today to treat with OMT was based on Physical Exam  After verbal consent patient was treated with HVLA, ME, FPR techniques in cervical, rib, thoracic, lumbar, and sacral  areas  Patient tolerated the procedure well with improvement in symptoms  Patient given exercises, stretches and lifestyle modifications  See medications in patient instructions if given  Patient will follow up in 4-8 weeks      The above documentation has been reviewed and is accurate and complete Belva Agee       Note: This dictation was prepared with Diplomatic Services operational officer dictation along with smaller Company secretary. Any transcriptional errors that result from this process are unintentional.

## 2021-04-12 ENCOUNTER — Encounter: Payer: Self-pay | Admitting: Family Medicine

## 2021-04-13 DIAGNOSIS — F411 Generalized anxiety disorder: Secondary | ICD-10-CM | POA: Diagnosis not present

## 2021-04-14 ENCOUNTER — Ambulatory Visit: Payer: No Typology Code available for payment source | Admitting: Family Medicine

## 2021-04-23 DIAGNOSIS — F411 Generalized anxiety disorder: Secondary | ICD-10-CM | POA: Diagnosis not present

## 2021-04-28 ENCOUNTER — Other Ambulatory Visit (HOSPITAL_COMMUNITY): Payer: Self-pay

## 2021-04-28 MED ORDER — CARESTART COVID-19 HOME TEST VI KIT
PACK | 0 refills | Status: DC
  Filled 2021-04-28 – 2021-05-07 (×2): qty 4, 4d supply, fill #0

## 2021-05-05 ENCOUNTER — Ambulatory Visit: Payer: Self-pay

## 2021-05-05 ENCOUNTER — Other Ambulatory Visit: Payer: Self-pay

## 2021-05-05 ENCOUNTER — Ambulatory Visit (INDEPENDENT_AMBULATORY_CARE_PROVIDER_SITE_OTHER): Payer: 59 | Admitting: Sports Medicine

## 2021-05-05 VITALS — BP 128/84 | HR 63 | Ht 70.0 in | Wt 172.0 lb

## 2021-05-05 DIAGNOSIS — G8929 Other chronic pain: Secondary | ICD-10-CM

## 2021-05-05 DIAGNOSIS — M545 Low back pain, unspecified: Secondary | ICD-10-CM | POA: Diagnosis not present

## 2021-05-05 DIAGNOSIS — M533 Sacrococcygeal disorders, not elsewhere classified: Secondary | ICD-10-CM | POA: Diagnosis not present

## 2021-05-05 NOTE — Patient Instructions (Addendum)
Injection today Do prescribed exercises at least 3x a week See you again in 2-4 weeks

## 2021-05-05 NOTE — Progress Notes (Signed)
[name] [name] D.[employer] [place] Phone: [phone]   Assessment and Plan:    1. Chronic bilateral low back pain without sciatica -Chronic with exacerbation, subsequent sports medicine visit - Unclear etiology of chronic low back pain - Patient has tried multiple treatments for low back pain including OMT, lumbar facet injections, lumbar epidural that have only provided short-term ("maybe a few days") pain relief - Patient has history of running for exercise, and acute and significant increase in his running frequency and distance, physical exam findings that could be consistent with a labral tear.  Patient's pain is most significant and consistent on the right side, so patient elected to trial a CSI to intra-articular right hip to see if this improves his pain - We elected to not inject SI joint today based on patient's not having pain with SI compression on physical exam, and unremarkable appearing SI joints from MRI in 2020.  If today's injection provides no benefit, could consider SI joint injection at future visit - [place] LIMITED JOINT SPACE STRUCTURES LOW RIGHT(NO LINKED CHARGES)    Procedure: Ultrasound Guided Hip Acetabulofemoral Joint Injection Side: Right Diagnosis: Bilateral low back pain with HPI and physical exam findings suspicious for labral tear [place] Indication:  - accuracy is paramount for diagnosis - to ensure therapeutic efficacy or procedural success - to reduce procedural risk  After PARQ discussed and consent was given verbally. The site was cleaned with chlorhexidine prep. An ultrasound transducer was placed on the anterior thigh/hip.   The acetabular joint, labrum, and femoral shaft were identified.  The neurovascular structures were identified and an approach was found specifically avoiding these structures.  A steroid injection was performed under ultrasound guidance with sterile technique using 100m of 1% lidocaine  without epinephrine and 40 mg of triamcinolone (KENALOG) 453mml. This was well tolerated and resulted in some relief.  Needle was removed and dressing placed and post injection instructions were given including  a discussion of likely return of pain today after the anesthetic wears off (with the possibility of worsened pain) until the steroid starts to work in 1-3 days.   Pt was advised to call or return to clinic if these symptoms worsen or fail to improve as anticipated.   Pertinent previous records reviewed include MRI lumbar spine from 5/21 11/12/2019, x-ray SI joints from 11/12/2019, x-ray lumbar spine from 11/12/2019   Follow Up: In 2 to 4 weeks for reevaluation.  Could consider repeat OMT versus SI joint injection at that time   Subjective:   [name] [name], [name] [name] serving as a scEducation administratoror [name] [name] Chief Complaint: LBP  HPI:   05/05/21 SI joint injection was talked about last visit, was given facet injections. Pain today is in the low back and on the higher end of the pain scale. Pain in mid spine butt and SI. Sharp pain with movement, constant throbbing pain. Wants to know what are the next steps  03/10/2021 Relevant Historical Information: Manipulation today.  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  We will try facet injection.  If continuing to have pain can be a candidate for possible sacroiliac injections as well.  Started on gabapentin and [name] [name] think patient will do relatively well with discussed other over-the-counter medications.  Follow-up with me again in 4 to 8 weeks.  Decision today to treat with OMT was based on  Physical Exam   After verbal consent patient was treated with HVLA, ME, FPR techniques in cervical, thoracic, lumbar and sacral areas, all areas are chronic    Patient tolerated the procedure well with improvement in symptoms   Patient given exercises, stretches and lifestyle modifications    See medications in patient instructions if given   Patient will follow up in 4-8 weeks  Additional pertinent review of systems negative.  Current Outpatient Medications  Medication Sig Dispense Refill   Ascorbic Acid (VITAMIN C) 1000 MG tablet Take 1,000 mg by mouth daily.     Cholecalciferol (VITAMIN D PO) Take 25,000 mcg by mouth daily.     clomiPHENE (CLOMID) 50 MG tablet TAKE 1/4 TABLET BY MOUTH 3 TIMES A WEEK 12 tablet 3   clonazePAM (KLONOPIN) 1 MG tablet Take 1 mg by mouth at bedtime.      clonazePAM (KLONOPIN) 1 MG tablet TAKE 1 AND 1/2 TABLETS BY MOUTH AT BEDTIME AND TAKE 1/2 TABLET BY MOUTH DURING THE DAY AS NEEDED.. 180 tablet 0   clonazePAM (KLONOPIN) 1 MG tablet TAKE 1 AND 1/2 TABLETS BY MOUTH AT BEDTIME AND TAKE 1/2 TABLET BY MOUTH DURING THE DAY AS NEEDED. 180 tablet 0   clonazePAM (KLONOPIN) 1 MG tablet Take 1 and 1/2 tablets by mouth at bedtime and 1/2 tablet during the day if needed. 180 tablet 1   COVID-19 At Home Antigen Test ([facility] COVID-19 HOME TEST) KIT Use as directed 4 each 0   COVID-19 At Home Antigen Test ([facility] COVID-19 HOME TEST) KIT Use as directed within package instructions. 4 each 0   Cyanocobalamin (VITAMIN B-12 PO) Take 2,000 mcg by mouth daily.     DULoxetine (CYMBALTA) 20 MG capsule Take 1capsule by mouth in the morning for 7 days, then 2 capsules in the morning  for  7 days, then 3 capsules in the morning 60 capsule 0   gabapentin (NEURONTIN) 100 MG capsule Take 2 capsules by mouth at bedtime. 180 capsule 0   Multiple Vitamin (MULTIVITAMIN) tablet Take 1 tablet by mouth daily.     nirmatrelvir & ritonavir (PAXLOVID, 300/100,) 20 x 150 MG & 10 x 100MG TBPK Use as directed within package 30 tablet 0   nitroGLYCERIN (NITRODUR - DOSED IN MG/24 HR) 0.2 mg/hr patch PLACE 1/4 PATCH OVER AFFECTED AREA ONCE A DAY AS DIRECTED. 30 patch 1   predniSONE (DELTASONE) 20 MG tablet Take 2 tabs by mouth daily with food for 3 days 6 tablet 0   traMADol (ULTRAM) 50  MG tablet Take 1 tablet (50 mg total) by mouth every 6 (six) hours as needed for moderate pain. 50 tablet 0   No current facility-administered medications for this visit.      Objective:     Vitals:   05/05/21 1303  BP: 128/84  Pulse: 63  SpO2: 99%  Weight: 172 lb (78 kg)  Height: _0  (1.778 m)      Body mass index is 24.68 kg/m.    Physical Exam:     Gen: Appears well, nad, nontoxic and pleasant Psych: Alert and oriented, appropriate mood and affect Neuro: sensation intact, strength is 5/5 in upper and lower extremities, muscle tone wnl Skin: no susupicious lesions or rashes  Back - Normal skin, Spine with normal alignment and no deformity.   No tenderness to vertebral process palpation.   Paraspinous muscles are mildly tender and without spasm Straight leg raise negative Trendelenberg positive on right  General: awake, alert, and oriented  no acute distress, nontoxic Skin: no suspicious lesions or rashes Neuro:sensation intact distally with no dificits, normal muscle tone, no atrophy, strength 5/5 in all tested lower ext groups Psych: normal mood and affect, speech clear  Bilateral hip: No deformity, swelling or wasting ROM Fexion 90, ext 30, IR 45, ER 45 TTP gluteal musculature, greater troches NTTP over the hip flexor, si joint, lumbar spine Negative log roll with FROM Positive FABER on right with posterior pain Positive FADIR on right with posterior pain Negative Piriformis test  Gait normal   Electronically signed by:  [name] [name] D.[facility] 5:06 PM 05/05/21

## 2021-05-06 ENCOUNTER — Ambulatory Visit: Payer: No Typology Code available for payment source | Admitting: Sports Medicine

## 2021-05-06 ENCOUNTER — Other Ambulatory Visit (HOSPITAL_COMMUNITY): Payer: Self-pay

## 2021-05-07 ENCOUNTER — Other Ambulatory Visit (HOSPITAL_COMMUNITY): Payer: Self-pay

## 2021-05-07 MED ORDER — TRAMADOL HCL 50 MG PO TABS
ORAL_TABLET | ORAL | 1 refills | Status: DC
  Filled 2021-05-07: qty 90, 30d supply, fill #0

## 2021-05-07 MED ORDER — METHOCARBAMOL 500 MG PO TABS
ORAL_TABLET | ORAL | 1 refills | Status: DC
  Filled 2021-05-07: qty 45, 12d supply, fill #0

## 2021-05-08 ENCOUNTER — Other Ambulatory Visit: Payer: Self-pay | Admitting: Internal Medicine

## 2021-05-08 DIAGNOSIS — M5136 Other intervertebral disc degeneration, lumbar region: Secondary | ICD-10-CM

## 2021-05-12 ENCOUNTER — Ambulatory Visit
Admission: RE | Admit: 2021-05-12 | Discharge: 2021-05-12 | Disposition: A | Discharge status: Home / Self Care | Payer: 59 | Source: Ambulatory Visit | Attending: Internal Medicine | Admitting: Internal Medicine

## 2021-05-12 DIAGNOSIS — M5136 Other intervertebral disc degeneration, lumbar region: Secondary | ICD-10-CM

## 2021-05-12 DIAGNOSIS — M48061 Spinal stenosis, lumbar region without neurogenic claudication: Secondary | ICD-10-CM | POA: Diagnosis not present

## 2021-05-12 DIAGNOSIS — M47816 Spondylosis without myelopathy or radiculopathy, lumbar region: Secondary | ICD-10-CM | POA: Diagnosis not present

## 2021-05-14 DIAGNOSIS — M9902 Segmental and somatic dysfunction of thoracic region: Secondary | ICD-10-CM | POA: Diagnosis not present

## 2021-05-14 DIAGNOSIS — M9903 Segmental and somatic dysfunction of lumbar region: Secondary | ICD-10-CM | POA: Diagnosis not present

## 2021-05-14 DIAGNOSIS — M9901 Segmental and somatic dysfunction of cervical region: Secondary | ICD-10-CM | POA: Diagnosis not present

## 2021-05-14 DIAGNOSIS — M5441 Lumbago with sciatica, right side: Secondary | ICD-10-CM | POA: Diagnosis not present

## 2021-05-19 DIAGNOSIS — M9901 Segmental and somatic dysfunction of cervical region: Secondary | ICD-10-CM | POA: Diagnosis not present

## 2021-05-19 DIAGNOSIS — F411 Generalized anxiety disorder: Secondary | ICD-10-CM | POA: Diagnosis not present

## 2021-05-19 DIAGNOSIS — M9902 Segmental and somatic dysfunction of thoracic region: Secondary | ICD-10-CM | POA: Diagnosis not present

## 2021-05-19 DIAGNOSIS — M5441 Lumbago with sciatica, right side: Secondary | ICD-10-CM | POA: Diagnosis not present

## 2021-05-19 DIAGNOSIS — M9903 Segmental and somatic dysfunction of lumbar region: Secondary | ICD-10-CM | POA: Diagnosis not present

## 2021-05-25 NOTE — Progress Notes (Signed)
Benito Mccreedy D.[place] Phone: [phone]   Assessment and Plan:     1. Chronic bilateral low back pain without sciatica 2. Lumbar radiculopathy 3. Somatic dysfunction of thoracic region 4. Somatic dysfunction of lumbar region 5. Somatic dysfunction of pelvic region -Chronic with exacerbation, subsequent visit - Unclear etiology of chronic low back pain - Patient received roughly 24 hours relief after corticosteroid injection to intra-articular hip, so hip pathology does not appear to be a primary pain generator for patient - Discussed that intermittent pain may be due to generalized core and low back muscle weakness after discontinuing certain exercises over the past 1+ year the patient thought were aggravating his pain.  Encouraged to restart running gradually, and start core and posterior chain HEP.  Handout provided - Repeat lumbar MRI reviewed with patient in the room showing minimal progression with L4-5 disc compression and mild L5-S1 disc compression.  These could be a pain trigger for patient, however they have progressed minimally number 1 years time since previous MRI, and patient has not responded to facet and epidural injections.  Patient plans on following up with neurosurgery to discuss further treatment options - Discussed the possibility of SI joint injections, however patient does not have pain located over SI joints on physical exam, and no abnormality seen on either MRI.  If no improvement or worsening of symptoms at follow-up visit, could consider SI joint injection to see if this provides patient with relief - Patient has received some relief with OMT in the past.  Elects for repeat OMT today.  Tolerated well per note below. - Decision today to treat with OMT was based on Physical Exam   After verbal consent patient was treated with HVLA (high velocity low amplitude), ME (muscle energy), FPR (flex positional  release), ST (soft tissue), PC/PD (Pelvic Compression/ Pelvic Decompression) techniques in thoracic, lumbar, and pelvic areas. Patient tolerated the procedure well with improvement in symptoms.  Patient educated on potential side effects of soreness and recommended to rest, hydrate, and use Tylenol as needed for pain control.   Pertinent previous records reviewed include lumbar MRI 05/12/2021, lumbar MRI 11/12/2019, facet injection report 03/26/2021   Follow Up: 4 weeks for repeat OMT. Discussed the possibility of SI joint injections, however patient does not have pain located over SI joints on physical exam, and no abnormality seen on either MRI.  If no improvement or worsening of symptoms at follow-up visit, could consider SI joint injection to see if this provides patient with relief   Subjective:   I, [name] [name], am serving as a scribe for Dr. Glennon Mac  Chief Complaint: low back pain follow up   HPI:   05/05/21 SI joint injection was talked about last visit, was given facet injections. Pain today is in the low back and on the higher end of the pain scale. Pain in mid spine butt and SI. Sharp pain with movement, constant throbbing pain. Wants to know what are the next steps   03/10/2021 Relevant Historical Information: Manipulation today.  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  We will try facet injection.  If continuing to have pain can be a candidate for possible sacroiliac injections as well.  Started on gabapentin and I think patient will do relatively well with discussed other over-the-counter medications.  Follow-up with me again in 4 to 8 weeks.  05/26/21 Patient states that his back has not been doing great and he had gotten some robaxin and tramadol and had a repeat MRI of his spine, and has an appointment with neurosurgery for Thursday. Injection helped the day of but not much more.   Relevant  Historical Information: None pertinent  Additional pertinent review of systems negative.  Current Outpatient Medications  Medication Sig Dispense Refill   Ascorbic Acid (VITAMIN C) 1000 MG tablet Take 1,000 mg by mouth daily.     Cholecalciferol (VITAMIN D PO) Take 25,000 mcg by mouth daily.     clomiPHENE (CLOMID) 50 MG tablet TAKE 1/4 TABLET BY MOUTH 3 TIMES A WEEK 12 tablet 3   clonazePAM (KLONOPIN) 1 MG tablet Take 1 mg by mouth at bedtime.      clonazePAM (KLONOPIN) 1 MG tablet Take 1 and 1/2 tablets by mouth at bedtime and 1/2 tablet during the day if needed. 180 tablet 1   COVID-19 At Home Antigen Test (CARESTART COVID-19 HOME TEST) KIT Use as directed 4 each 0   COVID-19 At Home Antigen Test (CARESTART COVID-19 HOME TEST) KIT Use as directed within package instructions. 4 each 0   Cyanocobalamin (VITAMIN B-12 PO) Take 2,000 mcg by mouth daily.     DULoxetine (CYMBALTA) 20 MG capsule Take 1capsule by mouth in the morning for 7 days, then 2 capsules in the morning  for  7 days, then 3 capsules in the morning 60 capsule 0   gabapentin (NEURONTIN) 100 MG capsule Take 2 capsules by mouth at bedtime. 180 capsule 0   methocarbamol (ROBAXIN) 500 MG tablet Take 1 tablet by mouth every 6 hours as needed. 45 tablet 1   Multiple Vitamin (MULTIVITAMIN) tablet Take 1 tablet by mouth daily.     traMADol (ULTRAM) 50 MG tablet Take 1 tablet (50 mg total) by mouth every 6 (six) hours as needed for moderate pain. 50 tablet 0   traMADol (ULTRAM) 50 MG tablet Take 1-2 tablets by mouth every 6 hours as needed (30 day supply). 90 tablet 1   clonazePAM (KLONOPIN) 1 MG tablet TAKE 1 AND 1/2 TABLETS BY MOUTH AT BEDTIME AND TAKE 1/2 TABLET BY MOUTH DURING THE DAY AS NEEDED.. 180 tablet 0   clonazePAM (KLONOPIN) 1 MG tablet TAKE 1 AND 1/2 TABLETS BY MOUTH AT BEDTIME AND TAKE 1/2 TABLET BY MOUTH DURING THE DAY AS NEEDED. 180 tablet 0   No current facility-administered medications for this visit.      Objective:      Vitals:   05/26/21 1347  BP: 106/70  Pulse: 75  SpO2: 98%  Weight: 172 lb (78 kg)  Height: '5\' 10"'  (1.778 m)      Body mass index is 24.68 kg/m.    Physical Exam:     General: Well-appearing, cooperative, sitting comfortably in no acute distress.   OMT Physical Exam:  ASIS Compression Test: Positive Right Thoracic: TTP paraspinal, T4-9 RRSL Lumbar: TTP paraspinal, significantly limited rotation in either direction throughout lumbar spine Pelvis: Right anterior innominate  Electronically signed by:  Benito Mccreedy D.[facility] 3:25 PM 05/26/21

## 2021-05-26 ENCOUNTER — Ambulatory Visit (INDEPENDENT_AMBULATORY_CARE_PROVIDER_SITE_OTHER): Payer: 59 | Admitting: Sports Medicine

## 2021-05-26 ENCOUNTER — Other Ambulatory Visit: Payer: Self-pay

## 2021-05-26 VITALS — BP 106/70 | HR 75 | Ht 70.0 in | Wt 172.0 lb

## 2021-05-26 DIAGNOSIS — M545 Low back pain, unspecified: Secondary | ICD-10-CM

## 2021-05-26 DIAGNOSIS — M9902 Segmental and somatic dysfunction of thoracic region: Secondary | ICD-10-CM | POA: Diagnosis not present

## 2021-05-26 DIAGNOSIS — M5416 Radiculopathy, lumbar region: Secondary | ICD-10-CM

## 2021-05-26 DIAGNOSIS — M9903 Segmental and somatic dysfunction of lumbar region: Secondary | ICD-10-CM | POA: Diagnosis not present

## 2021-05-26 DIAGNOSIS — M9905 Segmental and somatic dysfunction of pelvic region: Secondary | ICD-10-CM | POA: Diagnosis not present

## 2021-05-26 DIAGNOSIS — G8929 Other chronic pain: Secondary | ICD-10-CM

## 2021-05-26 NOTE — Patient Instructions (Addendum)
Good to see you  Low back posterior chain and core exercises given  Follow up in 4 weeks for repeat OMT

## 2021-05-27 DIAGNOSIS — F411 Generalized anxiety disorder: Secondary | ICD-10-CM | POA: Diagnosis not present

## 2021-05-28 ENCOUNTER — Other Ambulatory Visit (HOSPITAL_COMMUNITY): Payer: Self-pay

## 2021-05-28 DIAGNOSIS — M545 Low back pain, unspecified: Secondary | ICD-10-CM | POA: Diagnosis not present

## 2021-05-28 DIAGNOSIS — R030 Elevated blood-pressure reading, without diagnosis of hypertension: Secondary | ICD-10-CM | POA: Diagnosis not present

## 2021-05-28 MED ORDER — TIZANIDINE HCL 4 MG PO CAPS
ORAL_CAPSULE | ORAL | 2 refills | Status: DC
  Filled 2021-05-28: qty 90, 30d supply, fill #0
  Filled 2021-07-02: qty 90, 15d supply, fill #0

## 2021-06-05 ENCOUNTER — Other Ambulatory Visit (HOSPITAL_COMMUNITY): Payer: Self-pay

## 2021-06-10 DIAGNOSIS — F411 Generalized anxiety disorder: Secondary | ICD-10-CM | POA: Diagnosis not present

## 2021-06-22 NOTE — Progress Notes (Deleted)
[name] [name] D.[place] Phone: [phone]   Assessment and Plan:     There are no diagnoses linked to this encounter.  ***   Pertinent previous records reviewed include ***   Follow Up: ***     Subjective:    I, [name] [name], am serving as a Education administrator for [employer]  Chief Complaint: chronic low back pain   HPI:  05/05/21 SI joint injection was talked about last visit, was given facet injections. Pain today is in the low back and on the higher end of the pain scale. Pain in mid spine butt and SI. Sharp pain with movement, constant throbbing pain. Wants to know what are the next steps   03/10/2021 Relevant Historical Information: Manipulation today.  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  We will try facet injection.  If continuing to have pain can be a candidate for possible sacroiliac injections as well.  Started on gabapentin and I think patient will do relatively well with discussed other over-the-counter medications.  Follow-up with me again in 4 to 8 weeks.   05/26/21 Patient states that his back has not been doing great and he had gotten some robaxin and tramadol and had a repeat MRI of his spine, and has an appointment with neurosurgery for Thursday. Injection helped the day of but not much more.   06/23/2021 Patient states   Relevant Historical Information: ***  Additional pertinent review of systems negative.   Current Outpatient Medications:    Ascorbic Acid (VITAMIN C) 1000 MG tablet, Take 1,000 mg by mouth daily., Disp: , Rfl:    Cholecalciferol (VITAMIN D PO), Take 25,000 mcg by mouth daily., Disp: , Rfl:    clomiPHENE (CLOMID) 50 MG tablet, TAKE 1/4 TABLET BY MOUTH 3 TIMES A WEEK, Disp: 12 tablet, Rfl: 3   clonazePAM (KLONOPIN) 1 MG tablet, Take 1 mg by mouth at bedtime. , Disp: , Rfl:     clonazePAM (KLONOPIN) 1 MG tablet, TAKE 1 AND 1/2 TABLETS BY MOUTH AT BEDTIME AND TAKE 1/2 TABLET BY MOUTH DURING THE DAY AS NEEDED.., Disp: 180 tablet, Rfl: 0   clonazePAM (KLONOPIN) 1 MG tablet, TAKE 1 AND 1/2 TABLETS BY MOUTH AT BEDTIME AND TAKE 1/2 TABLET BY MOUTH DURING THE DAY AS NEEDED., Disp: 180 tablet, Rfl: 0   clonazePAM (KLONOPIN) 1 MG tablet, Take 1 and 1/2 tablets by mouth at bedtime and 1/2 tablet during the day if needed., Disp: 180 tablet, Rfl: 1   COVID-19 At Home Antigen Test (CARESTART COVID-19 HOME TEST) KIT, Use as directed, Disp: 4 each, Rfl: 0   COVID-19 At Home Antigen Test (CARESTART COVID-19 HOME TEST) KIT, Use as directed within package instructions., Disp: 4 each, Rfl: 0   Cyanocobalamin (VITAMIN B-12 PO), Take 2,000 mcg by mouth daily., Disp: , Rfl:    DULoxetine (CYMBALTA) 20 MG capsule, Take 1capsule by mouth in the morning for 7 days, then 2 capsules in the morning  for  7 days, then 3 capsules in the morning, Disp: 60 capsule, Rfl: 0   gabapentin (NEURONTIN) 100 MG capsule, Take 2 capsules by mouth at bedtime., Disp: 180 capsule, Rfl: 0   methocarbamol (ROBAXIN) 500 MG tablet, Take 1 tablet by mouth every 6 hours as needed., Disp: 45 tablet, Rfl: 1   Multiple Vitamin (MULTIVITAMIN) tablet, Take 1 tablet by mouth daily.,  Disp: , Rfl:    tiZANidine (ZANAFLEX) 4 MG capsule, Take 1 capsule by mouth every 6-8 hours as needed for spasms * Do not exceed 3 doses in 24 hours, Disp: 90 capsule, Rfl: 2   traMADol (ULTRAM) 50 MG tablet, Take 1 tablet (50 mg total) by mouth every 6 (six) hours as needed for moderate pain., Disp: 50 tablet, Rfl: 0   traMADol (ULTRAM) 50 MG tablet, Take 1-2 tablets by mouth every 6 hours as needed (30 day supply)., Disp: 90 tablet, Rfl: 1   Objective:     There were no vitals filed for this visit.    There is no height or weight on file to calculate BMI.    Physical Exam:    ***   Electronically signed by:  [name] [name] D.[facility] 9:38 AM 06/22/21

## 2021-06-23 ENCOUNTER — Ambulatory Visit: Payer: No Typology Code available for payment source | Admitting: Sports Medicine

## 2021-06-29 ENCOUNTER — Ambulatory Visit (INDEPENDENT_AMBULATORY_CARE_PROVIDER_SITE_OTHER): Payer: 59 | Admitting: Sports Medicine

## 2021-06-29 ENCOUNTER — Other Ambulatory Visit: Payer: Self-pay

## 2021-06-29 VITALS — BP 110/80 | HR 78 | Ht 70.0 in | Wt 175.0 lb

## 2021-06-29 DIAGNOSIS — M545 Low back pain, unspecified: Secondary | ICD-10-CM | POA: Diagnosis not present

## 2021-06-29 DIAGNOSIS — G8929 Other chronic pain: Secondary | ICD-10-CM

## 2021-06-29 DIAGNOSIS — M5416 Radiculopathy, lumbar region: Secondary | ICD-10-CM | POA: Diagnosis not present

## 2021-06-29 NOTE — Patient Instructions (Addendum)
Good to see you Core/ posterior chain HEP Duloxetine 30 mg  Restart running as tolerated start low go slow  Continue PT Tylenol, NSAIDs as needed for pain flares 4 week follow up

## 2021-06-29 NOTE — Progress Notes (Signed)
Benito Mccreedy D.[place] Phone: [phone]   Assessment and Plan:     1. Chronic bilateral low back pain without sciatica 2. Lumbar radiculopathy -Chronic with exacerbation, subsequent visit - Unclear etiology of chronic lower back pain now progressing to thoracic back pain - Lumbar MRI from 05/12/2021 showing moderate impingement at L5-S1, however patient does not have radicular symptoms has not received relief from prior epidural/facet injections - Advised to start more consistent HEP working on core strengthening and posterior chain strengthening - Use Tylenol/NSAIDs as needed for pain control - Patient has seen a psychiatrist and has tried Cymbalta at different times and still has some medication left over.  Has not tried this medication in several months.  Advised that he could start duloxetine 30 mg daily to see if this helps decrease chronic pain -Encouraged to restart physical activity including running activities.  Advised to start low and go slow  Pertinent previous records reviewed include lumbar MRI 05/12/2021   Follow Up: 4 weeks for reevaluation.   Subjective:   I, Pincus Badder, am serving as a Education administrator for [employer]  Chief Complaint: low back pain   HPI:  05/05/21 SI joint injection was talked about last visit, was given facet injections. Pain today is in the low back and on the higher end of the pain scale. Pain in mid spine butt and SI. Sharp pain with movement, constant throbbing pain. Wants to know what are the next steps   03/10/2021 Relevant Historical Information: Manipulation today.  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  We will try facet injection.  If continuing to have pain can be a candidate for possible sacroiliac injections as well.  Started on gabapentin and I think patient will do relatively  well with discussed other over-the-counter medications.  Follow-up with me again in 4 to 8 weeks.   05/26/21 Patient states that his back has not been doing great and he had gotten some robaxin and tramadol and had a repeat MRI of his spine, and has an appointment with neurosurgery for Thursday. Injection helped the day of but not much more.   06/29/21 Patient states not a whole lot of progress was able to go running he felt physically better but has had to take meds for pain at night 4 times the past two weeks for pain, haven't been able to go to PT, had a massage but didn't help out a lot. Pt is frustrated dealing with pain    Relevant Historical Information: None pertinent  Additional pertinent review of systems negative.   Current Outpatient Medications:    Ascorbic Acid (VITAMIN C) 1000 MG tablet, Take 1,000 mg by mouth daily., Disp: , Rfl:    Cholecalciferol (VITAMIN D PO), Take 25,000 mcg by mouth daily., Disp: , Rfl:    clomiPHENE (CLOMID) 50 MG tablet, TAKE 1/4 TABLET BY MOUTH 3 TIMES A WEEK, Disp: 12 tablet, Rfl: 3   clonazePAM (KLONOPIN) 1 MG tablet, Take 1 mg by mouth at bedtime. , Disp: , Rfl:    clonazePAM (KLONOPIN) 1 MG tablet, Take 1 and 1/2 tablets by mouth at bedtime and 1/2 tablet during the day if needed., Disp: 180 tablet, Rfl: 1   COVID-19 At Home Antigen Test (CARESTART COVID-19 HOME TEST) KIT, Use as directed, Disp: 4 each, Rfl: 0   COVID-19 At Home Antigen Test (  CARESTART COVID-19 HOME TEST) KIT, Use as directed within package instructions., Disp: 4 each, Rfl: 0   Cyanocobalamin (VITAMIN B-12 PO), Take 2,000 mcg by mouth daily., Disp: , Rfl:    gabapentin (NEURONTIN) 100 MG capsule, Take 2 capsules by mouth at bedtime., Disp: 180 capsule, Rfl: 0   Multiple Vitamin (MULTIVITAMIN) tablet, Take 1 tablet by mouth daily., Disp: , Rfl:    tiZANidine (ZANAFLEX) 4 MG capsule, Take 1 capsule by mouth every 6-8 hours as needed for spasms * Do not exceed 3 doses in 24 hours,  Disp: 90 capsule, Rfl: 2   traMADol (ULTRAM) 50 MG tablet, Take 1 tablet (50 mg total) by mouth every 6 (six) hours as needed for moderate pain., Disp: 50 tablet, Rfl: 0   traMADol (ULTRAM) 50 MG tablet, Take 1-2 tablets by mouth every 6 hours as needed (30 day supply)., Disp: 90 tablet, Rfl: 1   clonazePAM (KLONOPIN) 1 MG tablet, TAKE 1 AND 1/2 TABLETS BY MOUTH AT BEDTIME AND TAKE 1/2 TABLET BY MOUTH DURING THE DAY AS NEEDED.., Disp: 180 tablet, Rfl: 0   clonazePAM (KLONOPIN) 1 MG tablet, TAKE 1 AND 1/2 TABLETS BY MOUTH AT BEDTIME AND TAKE 1/2 TABLET BY MOUTH DURING THE DAY AS NEEDED., Disp: 180 tablet, Rfl: 0   DULoxetine (CYMBALTA) 20 MG capsule, Take 1capsule by mouth in the morning for 7 days, then 2 capsules in the morning  for  7 days, then 3 capsules in the morning, Disp: 60 capsule, Rfl: 0   methocarbamol (ROBAXIN) 500 MG tablet, Take 1 tablet by mouth every 6 hours as needed., Disp: 45 tablet, Rfl: 1   Objective:     Vitals:   06/29/21 1355  BP: 110/80  Pulse: 78  SpO2: 98%  Weight: 175 lb (79.4 kg)  Height: _0  (1.778 m)      Body mass index is 25.11 kg/m.    Physical Exam:    Gen: Appears well, nad, nontoxic and pleasant Psych: Alert and oriented, appropriate mood and affect Neuro: sensation intact, strength is 5/5 in upper and lower extremities, muscle tone wnl Skin: no susupicious lesions or rashes  Back - Normal skin, Spine with normal alignment and no deformity.   No tenderness to vertebral process palpation.   Paraspinous muscles are  tender and without spasm Straight leg raise negative Trendelenberg negative    Electronically signed by:  Benito Mccreedy D.[facility] 3:35 PM 06/29/21

## 2021-07-01 DIAGNOSIS — F411 Generalized anxiety disorder: Secondary | ICD-10-CM | POA: Diagnosis not present

## 2021-07-02 ENCOUNTER — Other Ambulatory Visit (HOSPITAL_COMMUNITY): Payer: Self-pay

## 2021-07-03 ENCOUNTER — Other Ambulatory Visit (HOSPITAL_COMMUNITY): Payer: Self-pay

## 2021-07-14 ENCOUNTER — Other Ambulatory Visit (HOSPITAL_COMMUNITY): Payer: Self-pay

## 2021-07-15 ENCOUNTER — Other Ambulatory Visit (HOSPITAL_COMMUNITY): Payer: Self-pay

## 2021-07-15 MED ORDER — CLONAZEPAM 1 MG PO TABS
ORAL_TABLET | ORAL | 1 refills | Status: DC
  Filled 2021-07-15: qty 180, 90d supply, fill #0
  Filled 2021-10-11: qty 180, 90d supply, fill #1

## 2021-07-16 ENCOUNTER — Other Ambulatory Visit (HOSPITAL_COMMUNITY): Payer: Self-pay

## 2021-07-16 DIAGNOSIS — M532X7 Spinal instabilities, lumbosacral region: Secondary | ICD-10-CM | POA: Diagnosis not present

## 2021-07-16 DIAGNOSIS — M25651 Stiffness of right hip, not elsewhere classified: Secondary | ICD-10-CM | POA: Diagnosis not present

## 2021-07-16 DIAGNOSIS — M25551 Pain in right hip: Secondary | ICD-10-CM | POA: Diagnosis not present

## 2021-07-16 DIAGNOSIS — M25652 Stiffness of left hip, not elsewhere classified: Secondary | ICD-10-CM | POA: Diagnosis not present

## 2021-07-17 ENCOUNTER — Other Ambulatory Visit (HOSPITAL_COMMUNITY): Payer: Self-pay

## 2021-07-17 DIAGNOSIS — M545 Low back pain, unspecified: Secondary | ICD-10-CM | POA: Diagnosis not present

## 2021-07-17 MED ORDER — MELOXICAM 15 MG PO TABS
ORAL_TABLET | ORAL | 1 refills | Status: DC
  Filled 2021-07-17: qty 30, 30d supply, fill #0

## 2021-07-17 MED ORDER — CYCLOBENZAPRINE HCL 10 MG PO TABS
ORAL_TABLET | ORAL | 0 refills | Status: AC
  Filled 2021-07-17: qty 30, 10d supply, fill #0

## 2021-07-18 ENCOUNTER — Other Ambulatory Visit (HOSPITAL_COMMUNITY): Payer: Self-pay

## 2021-07-18 MED ORDER — CLONAZEPAM 1 MG PO TABS
ORAL_TABLET | ORAL | 0 refills | Status: DC
  Filled 2021-07-18: qty 180, 90d supply, fill #0

## 2021-07-20 ENCOUNTER — Other Ambulatory Visit (HOSPITAL_COMMUNITY): Payer: Self-pay

## 2021-07-20 DIAGNOSIS — M25652 Stiffness of left hip, not elsewhere classified: Secondary | ICD-10-CM | POA: Diagnosis not present

## 2021-07-20 DIAGNOSIS — M25651 Stiffness of right hip, not elsewhere classified: Secondary | ICD-10-CM | POA: Diagnosis not present

## 2021-07-20 DIAGNOSIS — M25551 Pain in right hip: Secondary | ICD-10-CM | POA: Diagnosis not present

## 2021-07-20 DIAGNOSIS — M532X7 Spinal instabilities, lumbosacral region: Secondary | ICD-10-CM | POA: Diagnosis not present

## 2021-07-20 MED ORDER — DIAZEPAM 5 MG PO TABS
ORAL_TABLET | ORAL | 0 refills | Status: DC
  Filled 2021-07-20: qty 2, 1d supply, fill #0

## 2021-07-21 DIAGNOSIS — M5416 Radiculopathy, lumbar region: Secondary | ICD-10-CM | POA: Diagnosis not present

## 2021-07-23 DIAGNOSIS — M25651 Stiffness of right hip, not elsewhere classified: Secondary | ICD-10-CM | POA: Diagnosis not present

## 2021-07-23 DIAGNOSIS — M25652 Stiffness of left hip, not elsewhere classified: Secondary | ICD-10-CM | POA: Diagnosis not present

## 2021-07-23 DIAGNOSIS — M25551 Pain in right hip: Secondary | ICD-10-CM | POA: Diagnosis not present

## 2021-07-23 DIAGNOSIS — M532X7 Spinal instabilities, lumbosacral region: Secondary | ICD-10-CM | POA: Diagnosis not present

## 2021-07-27 ENCOUNTER — Ambulatory Visit: Payer: No Typology Code available for payment source | Admitting: Sports Medicine

## 2021-07-27 DIAGNOSIS — M25652 Stiffness of left hip, not elsewhere classified: Secondary | ICD-10-CM | POA: Diagnosis not present

## 2021-07-27 DIAGNOSIS — M25551 Pain in right hip: Secondary | ICD-10-CM | POA: Diagnosis not present

## 2021-07-27 DIAGNOSIS — M532X7 Spinal instabilities, lumbosacral region: Secondary | ICD-10-CM | POA: Diagnosis not present

## 2021-07-27 DIAGNOSIS — M25651 Stiffness of right hip, not elsewhere classified: Secondary | ICD-10-CM | POA: Diagnosis not present

## 2021-07-28 DIAGNOSIS — M9902 Segmental and somatic dysfunction of thoracic region: Secondary | ICD-10-CM | POA: Diagnosis not present

## 2021-07-28 DIAGNOSIS — M9903 Segmental and somatic dysfunction of lumbar region: Secondary | ICD-10-CM | POA: Diagnosis not present

## 2021-07-28 DIAGNOSIS — M5441 Lumbago with sciatica, right side: Secondary | ICD-10-CM | POA: Diagnosis not present

## 2021-07-28 DIAGNOSIS — M9901 Segmental and somatic dysfunction of cervical region: Secondary | ICD-10-CM | POA: Diagnosis not present

## 2021-07-29 ENCOUNTER — Other Ambulatory Visit (HOSPITAL_COMMUNITY): Payer: Self-pay

## 2021-07-29 DIAGNOSIS — M532X7 Spinal instabilities, lumbosacral region: Secondary | ICD-10-CM | POA: Diagnosis not present

## 2021-07-29 DIAGNOSIS — M25652 Stiffness of left hip, not elsewhere classified: Secondary | ICD-10-CM | POA: Diagnosis not present

## 2021-07-29 DIAGNOSIS — M25651 Stiffness of right hip, not elsewhere classified: Secondary | ICD-10-CM | POA: Diagnosis not present

## 2021-07-29 DIAGNOSIS — M25551 Pain in right hip: Secondary | ICD-10-CM | POA: Diagnosis not present

## 2021-07-30 DIAGNOSIS — F411 Generalized anxiety disorder: Secondary | ICD-10-CM | POA: Diagnosis not present

## 2021-08-03 DIAGNOSIS — M9902 Segmental and somatic dysfunction of thoracic region: Secondary | ICD-10-CM | POA: Diagnosis not present

## 2021-08-03 DIAGNOSIS — M5441 Lumbago with sciatica, right side: Secondary | ICD-10-CM | POA: Diagnosis not present

## 2021-08-03 DIAGNOSIS — M9901 Segmental and somatic dysfunction of cervical region: Secondary | ICD-10-CM | POA: Diagnosis not present

## 2021-08-03 DIAGNOSIS — M9903 Segmental and somatic dysfunction of lumbar region: Secondary | ICD-10-CM | POA: Diagnosis not present

## 2021-08-04 DIAGNOSIS — M532X7 Spinal instabilities, lumbosacral region: Secondary | ICD-10-CM | POA: Diagnosis not present

## 2021-08-04 DIAGNOSIS — M25651 Stiffness of right hip, not elsewhere classified: Secondary | ICD-10-CM | POA: Diagnosis not present

## 2021-08-04 DIAGNOSIS — M25551 Pain in right hip: Secondary | ICD-10-CM | POA: Diagnosis not present

## 2021-08-04 DIAGNOSIS — M25652 Stiffness of left hip, not elsewhere classified: Secondary | ICD-10-CM | POA: Diagnosis not present

## 2021-08-06 ENCOUNTER — Other Ambulatory Visit (HOSPITAL_COMMUNITY): Payer: Self-pay

## 2021-08-06 DIAGNOSIS — M5416 Radiculopathy, lumbar region: Secondary | ICD-10-CM | POA: Diagnosis not present

## 2021-08-06 DIAGNOSIS — M532X7 Spinal instabilities, lumbosacral region: Secondary | ICD-10-CM | POA: Diagnosis not present

## 2021-08-06 DIAGNOSIS — M25651 Stiffness of right hip, not elsewhere classified: Secondary | ICD-10-CM | POA: Diagnosis not present

## 2021-08-06 DIAGNOSIS — M25652 Stiffness of left hip, not elsewhere classified: Secondary | ICD-10-CM | POA: Diagnosis not present

## 2021-08-06 DIAGNOSIS — M25551 Pain in right hip: Secondary | ICD-10-CM | POA: Diagnosis not present

## 2021-08-06 MED ORDER — DIAZEPAM 5 MG PO TABS
ORAL_TABLET | ORAL | 0 refills | Status: DC
  Filled 2021-08-06: qty 2, 1d supply, fill #0

## 2021-08-11 DIAGNOSIS — M5416 Radiculopathy, lumbar region: Secondary | ICD-10-CM | POA: Diagnosis not present

## 2021-08-12 DIAGNOSIS — F411 Generalized anxiety disorder: Secondary | ICD-10-CM | POA: Diagnosis not present

## 2021-08-13 DIAGNOSIS — M9902 Segmental and somatic dysfunction of thoracic region: Secondary | ICD-10-CM | POA: Diagnosis not present

## 2021-08-13 DIAGNOSIS — M9903 Segmental and somatic dysfunction of lumbar region: Secondary | ICD-10-CM | POA: Diagnosis not present

## 2021-08-13 DIAGNOSIS — M5441 Lumbago with sciatica, right side: Secondary | ICD-10-CM | POA: Diagnosis not present

## 2021-08-13 DIAGNOSIS — M9901 Segmental and somatic dysfunction of cervical region: Secondary | ICD-10-CM | POA: Diagnosis not present

## 2021-08-17 DIAGNOSIS — M25652 Stiffness of left hip, not elsewhere classified: Secondary | ICD-10-CM | POA: Diagnosis not present

## 2021-08-17 DIAGNOSIS — M532X7 Spinal instabilities, lumbosacral region: Secondary | ICD-10-CM | POA: Diagnosis not present

## 2021-08-17 DIAGNOSIS — M25551 Pain in right hip: Secondary | ICD-10-CM | POA: Diagnosis not present

## 2021-08-17 DIAGNOSIS — M25651 Stiffness of right hip, not elsewhere classified: Secondary | ICD-10-CM | POA: Diagnosis not present

## 2021-08-18 DIAGNOSIS — M9903 Segmental and somatic dysfunction of lumbar region: Secondary | ICD-10-CM | POA: Diagnosis not present

## 2021-08-18 DIAGNOSIS — M9901 Segmental and somatic dysfunction of cervical region: Secondary | ICD-10-CM | POA: Diagnosis not present

## 2021-08-18 DIAGNOSIS — M5441 Lumbago with sciatica, right side: Secondary | ICD-10-CM | POA: Diagnosis not present

## 2021-08-18 DIAGNOSIS — M9902 Segmental and somatic dysfunction of thoracic region: Secondary | ICD-10-CM | POA: Diagnosis not present

## 2021-08-25 DIAGNOSIS — F411 Generalized anxiety disorder: Secondary | ICD-10-CM | POA: Diagnosis not present

## 2021-08-26 ENCOUNTER — Other Ambulatory Visit (HOSPITAL_COMMUNITY): Payer: Self-pay

## 2021-08-26 DIAGNOSIS — F411 Generalized anxiety disorder: Secondary | ICD-10-CM | POA: Diagnosis not present

## 2021-08-26 MED ORDER — ONDANSETRON HCL 8 MG PO TABS
8.0000 mg | ORAL_TABLET | Freq: Three times a day (TID) | ORAL | 2 refills | Status: DC | PRN
  Filled 2021-08-26: qty 30, 10d supply, fill #0

## 2021-08-27 DIAGNOSIS — M9903 Segmental and somatic dysfunction of lumbar region: Secondary | ICD-10-CM | POA: Diagnosis not present

## 2021-08-27 DIAGNOSIS — M5441 Lumbago with sciatica, right side: Secondary | ICD-10-CM | POA: Diagnosis not present

## 2021-08-27 DIAGNOSIS — M9902 Segmental and somatic dysfunction of thoracic region: Secondary | ICD-10-CM | POA: Diagnosis not present

## 2021-08-27 DIAGNOSIS — M9901 Segmental and somatic dysfunction of cervical region: Secondary | ICD-10-CM | POA: Diagnosis not present

## 2021-08-28 DIAGNOSIS — M5416 Radiculopathy, lumbar region: Secondary | ICD-10-CM | POA: Diagnosis not present

## 2021-09-09 DIAGNOSIS — M9902 Segmental and somatic dysfunction of thoracic region: Secondary | ICD-10-CM | POA: Diagnosis not present

## 2021-09-09 DIAGNOSIS — M9903 Segmental and somatic dysfunction of lumbar region: Secondary | ICD-10-CM | POA: Diagnosis not present

## 2021-09-09 DIAGNOSIS — M9901 Segmental and somatic dysfunction of cervical region: Secondary | ICD-10-CM | POA: Diagnosis not present

## 2021-09-09 DIAGNOSIS — M5441 Lumbago with sciatica, right side: Secondary | ICD-10-CM | POA: Diagnosis not present

## 2021-09-10 DIAGNOSIS — F411 Generalized anxiety disorder: Secondary | ICD-10-CM | POA: Diagnosis not present

## 2021-09-15 DIAGNOSIS — M25551 Pain in right hip: Secondary | ICD-10-CM | POA: Diagnosis not present

## 2021-09-15 DIAGNOSIS — M532X7 Spinal instabilities, lumbosacral region: Secondary | ICD-10-CM | POA: Diagnosis not present

## 2021-09-15 DIAGNOSIS — M25652 Stiffness of left hip, not elsewhere classified: Secondary | ICD-10-CM | POA: Diagnosis not present

## 2021-09-15 DIAGNOSIS — M25651 Stiffness of right hip, not elsewhere classified: Secondary | ICD-10-CM | POA: Diagnosis not present

## 2021-09-16 DIAGNOSIS — M9902 Segmental and somatic dysfunction of thoracic region: Secondary | ICD-10-CM | POA: Diagnosis not present

## 2021-09-16 DIAGNOSIS — M5441 Lumbago with sciatica, right side: Secondary | ICD-10-CM | POA: Diagnosis not present

## 2021-09-16 DIAGNOSIS — M9903 Segmental and somatic dysfunction of lumbar region: Secondary | ICD-10-CM | POA: Diagnosis not present

## 2021-09-16 DIAGNOSIS — M9901 Segmental and somatic dysfunction of cervical region: Secondary | ICD-10-CM | POA: Diagnosis not present

## 2021-09-23 DIAGNOSIS — M532X7 Spinal instabilities, lumbosacral region: Secondary | ICD-10-CM | POA: Diagnosis not present

## 2021-09-23 DIAGNOSIS — M25652 Stiffness of left hip, not elsewhere classified: Secondary | ICD-10-CM | POA: Diagnosis not present

## 2021-09-23 DIAGNOSIS — M25651 Stiffness of right hip, not elsewhere classified: Secondary | ICD-10-CM | POA: Diagnosis not present

## 2021-09-23 DIAGNOSIS — M25551 Pain in right hip: Secondary | ICD-10-CM | POA: Diagnosis not present

## 2021-09-29 DIAGNOSIS — M5441 Lumbago with sciatica, right side: Secondary | ICD-10-CM | POA: Diagnosis not present

## 2021-09-29 DIAGNOSIS — M9903 Segmental and somatic dysfunction of lumbar region: Secondary | ICD-10-CM | POA: Diagnosis not present

## 2021-09-29 DIAGNOSIS — M9902 Segmental and somatic dysfunction of thoracic region: Secondary | ICD-10-CM | POA: Diagnosis not present

## 2021-09-29 DIAGNOSIS — M9901 Segmental and somatic dysfunction of cervical region: Secondary | ICD-10-CM | POA: Diagnosis not present

## 2021-10-07 DIAGNOSIS — F411 Generalized anxiety disorder: Secondary | ICD-10-CM | POA: Diagnosis not present

## 2021-10-08 DIAGNOSIS — F411 Generalized anxiety disorder: Secondary | ICD-10-CM | POA: Diagnosis not present

## 2021-10-12 ENCOUNTER — Other Ambulatory Visit (HOSPITAL_COMMUNITY): Payer: Self-pay

## 2021-10-13 DIAGNOSIS — M25551 Pain in right hip: Secondary | ICD-10-CM | POA: Diagnosis not present

## 2021-10-13 DIAGNOSIS — M25651 Stiffness of right hip, not elsewhere classified: Secondary | ICD-10-CM | POA: Diagnosis not present

## 2021-10-13 DIAGNOSIS — M532X7 Spinal instabilities, lumbosacral region: Secondary | ICD-10-CM | POA: Diagnosis not present

## 2021-10-13 DIAGNOSIS — M25652 Stiffness of left hip, not elsewhere classified: Secondary | ICD-10-CM | POA: Diagnosis not present

## 2021-10-14 ENCOUNTER — Telehealth: Payer: Self-pay | Admitting: Internal Medicine

## 2021-10-14 NOTE — Telephone Encounter (Signed)
Dr. Terance Ice having some decreased caliber of stools.  Several weeks now.  Has a family history of colon cancer. ? ? ?We have decided to schedule a colonoscopy to evaluate this. ? ?Diagnosis #1 change in bowel habits ?Diagnosis #2 family history of colon cancer ? ?Please arrange a direct colonoscopy. ?

## 2021-10-19 ENCOUNTER — Other Ambulatory Visit: Payer: Self-pay

## 2021-10-19 DIAGNOSIS — R194 Change in bowel habit: Secondary | ICD-10-CM

## 2021-10-19 DIAGNOSIS — Z85038 Personal history of other malignant neoplasm of large intestine: Secondary | ICD-10-CM

## 2021-10-19 NOTE — Telephone Encounter (Signed)
Spoke with Dr. Terance Ice ?Pt was scheduled for a Direct Colonoscopy on 12/01/2021 at 11:30 AM with Dr. Carlean Purl: Pt made aware ?Prep instructions were created for pt and sent to pt via My Chart: Pt made aware; ?Ambulatory referral placed for pt:  ?Pt verbalized understanding with all questions answered.  ? ?

## 2021-10-29 DIAGNOSIS — M25651 Stiffness of right hip, not elsewhere classified: Secondary | ICD-10-CM | POA: Diagnosis not present

## 2021-10-29 DIAGNOSIS — M25652 Stiffness of left hip, not elsewhere classified: Secondary | ICD-10-CM | POA: Diagnosis not present

## 2021-10-29 DIAGNOSIS — M25551 Pain in right hip: Secondary | ICD-10-CM | POA: Diagnosis not present

## 2021-10-29 DIAGNOSIS — M532X7 Spinal instabilities, lumbosacral region: Secondary | ICD-10-CM | POA: Diagnosis not present

## 2021-11-05 DIAGNOSIS — F411 Generalized anxiety disorder: Secondary | ICD-10-CM | POA: Diagnosis not present

## 2021-11-10 DIAGNOSIS — M532X7 Spinal instabilities, lumbosacral region: Secondary | ICD-10-CM | POA: Diagnosis not present

## 2021-11-10 DIAGNOSIS — F411 Generalized anxiety disorder: Secondary | ICD-10-CM | POA: Diagnosis not present

## 2021-11-10 DIAGNOSIS — M25651 Stiffness of right hip, not elsewhere classified: Secondary | ICD-10-CM | POA: Diagnosis not present

## 2021-11-10 DIAGNOSIS — M25652 Stiffness of left hip, not elsewhere classified: Secondary | ICD-10-CM | POA: Diagnosis not present

## 2021-11-10 DIAGNOSIS — M25551 Pain in right hip: Secondary | ICD-10-CM | POA: Diagnosis not present

## 2021-11-12 DIAGNOSIS — M25551 Pain in right hip: Secondary | ICD-10-CM | POA: Diagnosis not present

## 2021-11-12 DIAGNOSIS — M25652 Stiffness of left hip, not elsewhere classified: Secondary | ICD-10-CM | POA: Diagnosis not present

## 2021-11-12 DIAGNOSIS — M532X7 Spinal instabilities, lumbosacral region: Secondary | ICD-10-CM | POA: Diagnosis not present

## 2021-11-12 DIAGNOSIS — M25651 Stiffness of right hip, not elsewhere classified: Secondary | ICD-10-CM | POA: Diagnosis not present

## 2021-11-19 DIAGNOSIS — M25651 Stiffness of right hip, not elsewhere classified: Secondary | ICD-10-CM | POA: Diagnosis not present

## 2021-11-19 DIAGNOSIS — M532X7 Spinal instabilities, lumbosacral region: Secondary | ICD-10-CM | POA: Diagnosis not present

## 2021-11-19 DIAGNOSIS — M25551 Pain in right hip: Secondary | ICD-10-CM | POA: Diagnosis not present

## 2021-11-19 DIAGNOSIS — M25652 Stiffness of left hip, not elsewhere classified: Secondary | ICD-10-CM | POA: Diagnosis not present

## 2021-11-24 ENCOUNTER — Encounter: Payer: Self-pay | Admitting: Internal Medicine

## 2021-11-30 DIAGNOSIS — M25652 Stiffness of left hip, not elsewhere classified: Secondary | ICD-10-CM | POA: Diagnosis not present

## 2021-11-30 DIAGNOSIS — M25651 Stiffness of right hip, not elsewhere classified: Secondary | ICD-10-CM | POA: Diagnosis not present

## 2021-11-30 DIAGNOSIS — M25551 Pain in right hip: Secondary | ICD-10-CM | POA: Diagnosis not present

## 2021-11-30 DIAGNOSIS — M532X7 Spinal instabilities, lumbosacral region: Secondary | ICD-10-CM | POA: Diagnosis not present

## 2021-12-01 ENCOUNTER — Other Ambulatory Visit (HOSPITAL_COMMUNITY): Payer: Self-pay

## 2021-12-01 ENCOUNTER — Encounter: Payer: Self-pay | Admitting: Internal Medicine

## 2021-12-01 ENCOUNTER — Ambulatory Visit (AMBULATORY_SURGERY_CENTER): Payer: 59 | Admitting: Internal Medicine

## 2021-12-01 VITALS — BP 98/57 | HR 58 | Temp 98.4°F | Resp 11 | Ht 70.0 in | Wt 177.8 lb

## 2021-12-01 DIAGNOSIS — F32A Depression, unspecified: Secondary | ICD-10-CM | POA: Diagnosis not present

## 2021-12-01 DIAGNOSIS — R194 Change in bowel habit: Secondary | ICD-10-CM | POA: Diagnosis not present

## 2021-12-01 DIAGNOSIS — F419 Anxiety disorder, unspecified: Secondary | ICD-10-CM | POA: Diagnosis not present

## 2021-12-01 DIAGNOSIS — Z800 Family history of malignant neoplasm of digestive organs: Secondary | ICD-10-CM | POA: Diagnosis not present

## 2021-12-01 DIAGNOSIS — K635 Polyp of colon: Secondary | ICD-10-CM | POA: Diagnosis not present

## 2021-12-01 DIAGNOSIS — D124 Benign neoplasm of descending colon: Secondary | ICD-10-CM

## 2021-12-01 DIAGNOSIS — K648 Other hemorrhoids: Secondary | ICD-10-CM | POA: Diagnosis not present

## 2021-12-01 DIAGNOSIS — K5730 Diverticulosis of large intestine without perforation or abscess without bleeding: Secondary | ICD-10-CM

## 2021-12-01 MED ORDER — CLOMIPHENE CITRATE 50 MG PO TABS
ORAL_TABLET | ORAL | 3 refills | Status: DC
  Filled 2021-12-01: qty 9, 84d supply, fill #0
  Filled 2021-12-29: qty 10, 90d supply, fill #0
  Filled 2022-03-31: qty 10, 90d supply, fill #1
  Filled 2022-11-10: qty 10, 90d supply, fill #2

## 2021-12-01 MED ORDER — SODIUM CHLORIDE 0.9 % IV SOLN: 500.0000 mL | Freq: Once | INTRAVENOUS | Status: DC

## 2021-12-01 NOTE — Progress Notes (Signed)
PT taken to PACU. Monitors in place. VSS. Report given to RN. 

## 2021-12-01 NOTE — Progress Notes (Signed)
Called to room to assist during endoscopic procedure.  Patient ID and intended procedure confirmed with present staff. Received instructions for my participation in the procedure from the performing physician.  

## 2021-12-01 NOTE — Patient Instructions (Addendum)
There was a 2 mm polyp in the descending colon - removed.  Scattered diverticulosis as before.  Hemorrhoids a little prominent today - common after prep.  Nothing bad going on.  I will let you know pathology results and when to have another routine colonoscopy by mail and/or My Chart. Should be 5 years for next one.  I appreciate the opportunity to care for you. Gatha Mayer, MD, FACG  YOU HAD AN ENDOSCOPIC PROCEDURE TODAY AT [facility]:   Refer to the procedure report that was given to you for any specific questions about what was found during the examination.  If the procedure report does not answer your questions, please call your gastroenterologist to clarify.  If you requested that your care partner not be given the details of your procedure findings, then the procedure report has been included in a sealed envelope for you to review at your convenience later.  YOU SHOULD EXPECT: Some feelings of bloating in the abdomen. Passage of more gas than usual.  Walking can help get rid of the air that was put into your GI tract during the procedure and reduce the bloating. If you had a lower endoscopy (such as a colonoscopy or flexible sigmoidoscopy) you may notice spotting of blood in your stool or on the toilet paper. If you underwent a bowel prep for your procedure, you may not have a normal bowel movement for a few days.  Please Note:  You might notice some irritation and congestion in your nose or some drainage.  This is from the oxygen used during your procedure.  There is no need for concern and it should clear up in a day or so.  SYMPTOMS TO REPORT IMMEDIATELY:  Following lower endoscopy (colonoscopy or flexible sigmoidoscopy):  Excessive amounts of blood in the stool  Significant tenderness or worsening of abdominal pains  Swelling of the abdomen that is new, acute  Fever of 100F or higher  For urgent or emergent issues, a gastroenterologist can be reached at  any hour by calling [phone]. Do not use MyChart messaging for urgent concerns.    DIET:  We do recommend a small meal at first, but then you may proceed to your regular diet.  Drink plenty of fluids but you should avoid alcoholic beverages for 24 hours.  ACTIVITY:  You should plan to take it easy for the rest of today and you should NOT DRIVE or use heavy machinery until tomorrow (because of the sedation medicines used during the test).    FOLLOW UP: Our staff will call the number listed on your records 48-72 hours following your procedure to check on you and address any questions or concerns that you may have regarding the information given to you following your procedure. If we do not reach you, we will leave a message.  We will attempt to reach you two times.  During this call, we will ask if you have developed any symptoms of COVID 19. If you develop any symptoms (ie: fever, flu-like symptoms, shortness of breath, cough etc.) before then, please call [phone].  If you test positive for Covid 19 in the 2 weeks post procedure, please call and report this information to [place].    If any biopsies were taken you will be contacted by phone or by letter within the next 1-3 weeks.  Please call us at [phone] if you have not heard about the biopsies in 3 weeks.    SIGNATURES/CONFIDENTIALITY: You and/or  your care partner have signed paperwork which will be entered into your electronic medical record.  These signatures attest to the fact that that the information above on your After Visit Summary has been reviewed and is understood.  Full responsibility of the confidentiality of this discharge information lies with you and/or your care-partner.

## 2021-12-01 NOTE — Op Note (Signed)
[place] Patient Name: [name] [name] Dam Procedure Date: 12/01/2021 11:35 AM MRN: [id number] Endoscopist: Gatha Mayer , MD Age: 56 Referring MD:  Date of Birth: 1965/09/05 Gender: Male Account #: [id number] Procedure:                Colonoscopy Indications:              Change in bowel habits, family Hx CRCA; last                            colonoscopy 2020 Medicines:                Monitored Anesthesia Care Procedure:                Pre-Anesthesia Assessment:                           - Prior to the procedure, a History and Physical                            was performed, and patient medications and                            allergies were reviewed. The patient's tolerance of                            previous anesthesia was also reviewed. The risks                            and benefits of the procedure and the sedation                            options and risks were discussed with the patient.                            All questions were answered, and informed consent                            was obtained. Prior Anticoagulants: The patient has                            taken no previous anticoagulant or antiplatelet                            agents. ASA Grade Assessment: II - A patient with                            mild systemic disease. After reviewing the risks                            and benefits, the patient was deemed in                            satisfactory condition to undergo the procedure.  After obtaining informed consent, the colonoscope                            was passed under direct vision. Throughout the                            procedure, the patient's blood pressure, pulse, and                            oxygen saturations were monitored continuously. The                            CF [id number] #[id number] was introduced through the anus                            and advanced to the the cecum, identified by                             appendiceal orifice and ileocecal valve. The                            colonoscopy was performed without difficulty. The                            patient tolerated the procedure well. The quality                            of the bowel preparation was good. The ileocecal                            valve, appendiceal orifice, and rectum were                            photographed. The bowel preparation used was                            Miralax via split dose instruction. Scope In: 11:51:07 AM Scope Out: 12:01:44 PM Scope Withdrawal Time: 0 hours 8 minutes 42 seconds  Total Procedure Duration: 0 hours 10 minutes 37 seconds  Findings:                 The perianal and digital rectal examinations were                            normal. Pertinent negatives include normal prostate                            (size, shape, and consistency).                           A 2 mm polyp was found in the distal descending                            colon. The polyp was sessile. The polyp was removed  with a cold snare. Resection and retrieval were                            complete. Verification of patient identification                            for the specimen was done. Estimated blood loss was                            minimal.                           Scattered diverticula were found in the entire                            colon.                           There was evidence of a prior end-to-end                            colo-colonic anastomosis in the sigmoid colon. This                            was patent and was characterized by healthy                            appearing mucosa.                           External and internal hemorrhoids were found.                           The exam was otherwise without abnormality on                            direct and retroflexion views. Complications:            No immediate  complications. Estimated Blood Loss:     Estimated blood loss was minimal. Impression:               - One 2 mm polyp in the distal descending colon,                            removed with a cold snare. Resected and retrieved.                           - Diverticulosis in the entire examined colon.                           - Patent end-to-end colo-colonic anastomosis,                            characterized by healthy appearing mucosa.                           -  External and internal hemorrhoids.                           - The examination was otherwise normal on direct                            and retroflexion views.                           - family Hx CRCA brother < 60 Recommendation:           - Patient has a contact number available for                            emergencies. The signs and symptoms of potential                            delayed complications were discussed with the                            patient. Return to normal activities tomorrow.                            Written discharge instructions were provided to the                            patient.                           - Resume previous diet.                           - Continue present medications.                           - Repeat colonoscopy is recommended. The                            colonoscopy date will be determined after pathology                            results from today's exam become available for                            review. Gatha Mayer, MD 12/01/2021 12:09:00 PM This report has been signed electronically.

## 2021-12-01 NOTE — Progress Notes (Signed)
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 He also has FHx CRCA in brother and a personal history of complicated diverticulitis s/p segmental resection   Past Medical History:  Diagnosis Date   Allergy    Anxiety    Depression    Diverticulitis    Obsessive compulsive disorder    OCD (obsessive compulsive disorder)    Tear of left glenoid labrum, posterior 05/13/2016    Past Surgical History:  Procedure Laterality Date   BICEPT TENODESIS Left 05/11/2018   Procedure: BICEPS TENODESIS;  Surgeon: Tania Ade, MD;  Location: [place];  Service: Orthopedics;  Laterality: Left;   COLONOSCOPY  09/04/2018   PARTIAL COLECTOMY  07/13/2007   for diverticulitis   SHOULDER ARTHROSCOPY WITH BANKART REPAIR Left 05/13/2016   Procedure: SHOULDER ARTHROSCOPY WITH POSTERIOR BANKART REPAIR with extensive debridement, left bursectomy;  Surgeon: Marchia Bond, MD;  Location: [place];  Service: Orthopedics;  Laterality: Left;  pre/Post Op Scalene block   SHOULDER ARTHROSCOPY WITH LABRAL REPAIR Left 05/11/2018   Procedure: LEFT SHOULDER ARTHROSCOPIC WITH LABRAL REPAIR AND BICEPS TENODESIS;  Surgeon: Tania Ade, MD;  Location: [place];  Service: Orthopedics;  Laterality: Left;   SHOULDER ARTHROSCOPY WITH SUBACROMIAL DECOMPRESSION Left 05/11/2018   Procedure: SHOULDER ARTHROSCOPY WITH SUBACROMIAL DECOMPRESSION;  Surgeon: Tania Ade, MD;  Location: [place];  Service: Orthopedics;  Laterality: Left;   WISDOM TOOTH EXTRACTION  07/12/1986    Prior to Admission medications   Medication Sig Start Date End Date Taking? Authorizing Provider  Ascorbic Acid (VITAMIN C) 1000 MG tablet Take 1,000 mg by mouth daily.    Yes [provider]  Cholecalciferol (VITAMIN D PO) Take 25,000 mcg by mouth daily.   Yes [provider]  clonazePAM (KLONOPIN) 1 MG tablet Take 1 and 1/2 tablets by mouth at bedtime and 1/2 tablet during the day if needed. 11/16/20  Yes   Cyanocobalamin (VITAMIN B-12 PO) Take 2,000 mcg by mouth daily.   Yes [provider]  cyclobenzaprine (FLEXERIL) 10 MG tablet Take 1 tablet by mouth three times a day as needed for pain. 07/17/21  Yes   Multiple Vitamin (MULTIVITAMIN) tablet Take 1 tablet by mouth daily.   Yes [provider]  clomiPHENE (CLOMID) 50 MG tablet TAKE 1/4 TABLET BY MOUTH 3 TIMES A WEEK 12/10/20 12/10/21  Renato Shin, MD  clonazePAM (KLONOPIN) 1 MG tablet TAKE 1 AND 1/2 TABLETS BY MOUTH AT BEDTIME AND TAKE 1/2 TABLET BY MOUTH DURING THE DAY AS NEEDED.. 08/12/20 02/08/21  Sandi Mealy, MD  clonazePAM (KLONOPIN) 1 MG tablet TAKE 1 AND 1/2 TABLETS BY MOUTH AT BEDTIME AND TAKE 1/2 TABLET BY MOUTH DURING THE DAY AS NEEDED. 05/09/20 11/05/20  Sandi Mealy, MD  traMADol (ULTRAM) 50 MG tablet Take by mouth every 6 (six) hours as needed.    [provider]    Current Outpatient Medications  Medication Sig Dispense Refill   Ascorbic Acid (VITAMIN C) 1000 MG tablet Take 1,000 mg by mouth daily.     Cholecalciferol (VITAMIN D PO) Take 25,000 mcg by mouth daily.     clonazePAM (KLONOPIN) 1 MG tablet Take 1 and 1/2 tablets by mouth at bedtime and 1/2 tablet  during the day if needed. 180 tablet 1   Cyanocobalamin (VITAMIN B-12 PO) Take 2,000 mcg by mouth daily.     cyclobenzaprine (FLEXERIL) 10 MG tablet Take 1 tablet by mouth three times a day as needed for pain. 30 tablet 0   Multiple Vitamin (MULTIVITAMIN) tablet Take 1 tablet by mouth daily.     clomiPHENE (CLOMID) 50 MG tablet TAKE 1/4 TABLET BY MOUTH 3 TIMES A WEEK 12 tablet 3   clonazePAM (KLONOPIN) 1 MG tablet TAKE 1 AND 1/2 TABLETS BY MOUTH AT BEDTIME AND TAKE 1/2 TABLET BY MOUTH DURING THE DAY AS  NEEDED.. 180 tablet 0   clonazePAM (KLONOPIN) 1 MG tablet TAKE 1 AND 1/2 TABLETS BY MOUTH AT BEDTIME AND TAKE 1/2 TABLET BY MOUTH DURING THE DAY AS NEEDED. 180 tablet 0   traMADol (ULTRAM) 50 MG tablet Take by mouth every 6 (six) hours as needed.     Current Facility-Administered Medications  Medication Dose Route Frequency Provider Last Rate Last Admin   0.9 %  sodium chloride infusion  500 mL Intravenous Once Gatha Mayer, MD        Allergies as of 12/01/2021 - Review Complete 12/01/2021  Allergen Reaction Noted   Dextromethorphan Hives, Swelling, and Other (See Comments) 01/12/2012    Family History  Problem Relation Age of Onset   Colon cancer Brother        < 60   Esophageal cancer Neg Hx    Rectal cancer Neg Hx    Stomach cancer Neg Hx     Social History   Socioeconomic History   Marital status: Divorced    Spouse name: Not on file   Number of children: Not on file   Years of education: Not on file   Highest education level: Not on file  Occupational History   Not on file  Tobacco Use   Smoking status: Never   Smokeless tobacco: Never  Vaping Use   Vaping Use: Never used  Substance and Sexual Activity   Alcohol use: Yes    Alcohol/week: 4.0 standard drinks    Types: 4 Cans of beer per week   Drug use: Never   Sexual activity: Not on file  Other Topics Concern   Not on file  Social History Narrative   Not on file   Social Determinants of Health   Financial Resource Strain: Not on file  Food Insecurity: Not on file  Transportation Needs: Not on file  Physical Activity: Not on file  Stress: Not on file  Social Connections: Not on file  Intimate Partner Violence: Not on file    Review of Systems:  All other review of systems negative except as mentioned in the HPI.  Physical Exam: Vital signs BP 106/60   Pulse 62   Temp 98.4 F (36.9 C) (Temporal)   Ht '5\' 10"'$  (1.778 m)   Wt 177 lb 12.8 oz (80.6 kg)   SpO2 98%   BMI 25.51 kg/m   General:    Alert,  Well-developed, well-nourished, pleasant and cooperative in NAD Lungs:  Clear throughout to auscultation.   Heart:  Regular rate and rhythm; no murmurs, clicks, rubs,  or gallops. Abdomen:  Soft, nontender and nondistended. Normal bowel sounds.   Neuro/Psych:  Alert and cooperative. Normal mood and affect. A and O x 3   '@Latoshia Monrroy'$  Simonne Maffucci, MD, [facility] [phone] (pager) 12/01/2021 11:36 AM@

## 2021-12-02 ENCOUNTER — Telehealth: Payer: Self-pay | Admitting: *Deleted

## 2021-12-02 ENCOUNTER — Telehealth: Payer: Self-pay

## 2021-12-02 NOTE — Telephone Encounter (Signed)
Attempted f/u phone call. No answer. Left message. °

## 2021-12-02 NOTE — Telephone Encounter (Signed)
2nd f/u call attempted, message left. SChaplin, RN,BSN

## 2021-12-09 DIAGNOSIS — F411 Generalized anxiety disorder: Secondary | ICD-10-CM | POA: Diagnosis not present

## 2021-12-10 ENCOUNTER — Other Ambulatory Visit (HOSPITAL_COMMUNITY): Payer: Self-pay

## 2021-12-29 ENCOUNTER — Other Ambulatory Visit (HOSPITAL_COMMUNITY): Payer: Self-pay

## 2021-12-30 DIAGNOSIS — F411 Generalized anxiety disorder: Secondary | ICD-10-CM | POA: Diagnosis not present

## 2021-12-31 DIAGNOSIS — F411 Generalized anxiety disorder: Secondary | ICD-10-CM | POA: Diagnosis not present

## 2022-01-05 DIAGNOSIS — H5203 Hypermetropia, bilateral: Secondary | ICD-10-CM | POA: Diagnosis not present

## 2022-01-05 DIAGNOSIS — H524 Presbyopia: Secondary | ICD-10-CM | POA: Diagnosis not present

## 2022-01-05 DIAGNOSIS — H52221 Regular astigmatism, right eye: Secondary | ICD-10-CM | POA: Diagnosis not present

## 2022-01-25 ENCOUNTER — Other Ambulatory Visit (HOSPITAL_COMMUNITY): Payer: Self-pay

## 2022-01-25 DIAGNOSIS — F331 Major depressive disorder, recurrent, moderate: Secondary | ICD-10-CM | POA: Diagnosis not present

## 2022-01-25 MED ORDER — CLONAZEPAM 1 MG PO TABS
ORAL_TABLET | ORAL | 0 refills | Status: AC
  Filled 2022-01-25: qty 180, 90d supply, fill #0

## 2022-01-26 ENCOUNTER — Other Ambulatory Visit (HOSPITAL_COMMUNITY): Payer: Self-pay

## 2022-01-28 DIAGNOSIS — F411 Generalized anxiety disorder: Secondary | ICD-10-CM | POA: Diagnosis not present

## 2022-02-01 ENCOUNTER — Encounter: Payer: Self-pay | Admitting: Internal Medicine

## 2022-02-02 DIAGNOSIS — F331 Major depressive disorder, recurrent, moderate: Secondary | ICD-10-CM | POA: Diagnosis not present

## 2022-02-03 ENCOUNTER — Other Ambulatory Visit (HOSPITAL_COMMUNITY): Payer: Self-pay

## 2022-02-03 MED ORDER — TRIAMCINOLONE ACETONIDE 0.5 % EX CREA
TOPICAL_CREAM | CUTANEOUS | 3 refills | Status: AC
Start: 2022-02-03 — End: ?
  Filled 2022-02-03: qty 30, 90d supply, fill #0
  Filled 2022-05-01: qty 30, 90d supply, fill #1

## 2022-02-04 DIAGNOSIS — F411 Generalized anxiety disorder: Secondary | ICD-10-CM | POA: Diagnosis not present

## 2022-02-08 DIAGNOSIS — F331 Major depressive disorder, recurrent, moderate: Secondary | ICD-10-CM | POA: Diagnosis not present

## 2022-02-09 DIAGNOSIS — F419 Anxiety disorder, unspecified: Secondary | ICD-10-CM | POA: Diagnosis not present

## 2022-02-09 DIAGNOSIS — E785 Hyperlipidemia, unspecified: Secondary | ICD-10-CM | POA: Diagnosis not present

## 2022-02-09 DIAGNOSIS — Z125 Encounter for screening for malignant neoplasm of prostate: Secondary | ICD-10-CM | POA: Diagnosis not present

## 2022-02-09 DIAGNOSIS — R7989 Other specified abnormal findings of blood chemistry: Secondary | ICD-10-CM | POA: Diagnosis not present

## 2022-02-09 DIAGNOSIS — E291 Testicular hypofunction: Secondary | ICD-10-CM | POA: Diagnosis not present

## 2022-02-16 DIAGNOSIS — M5136 Other intervertebral disc degeneration, lumbar region: Secondary | ICD-10-CM | POA: Diagnosis not present

## 2022-02-16 DIAGNOSIS — Z0000 Encounter for general adult medical examination without abnormal findings: Secondary | ICD-10-CM | POA: Diagnosis not present

## 2022-02-16 DIAGNOSIS — R030 Elevated blood-pressure reading, without diagnosis of hypertension: Secondary | ICD-10-CM | POA: Diagnosis not present

## 2022-02-16 DIAGNOSIS — E291 Testicular hypofunction: Secondary | ICD-10-CM | POA: Diagnosis not present

## 2022-02-16 DIAGNOSIS — E785 Hyperlipidemia, unspecified: Secondary | ICD-10-CM | POA: Diagnosis not present

## 2022-02-16 DIAGNOSIS — D539 Nutritional anemia, unspecified: Secondary | ICD-10-CM | POA: Diagnosis not present

## 2022-02-16 DIAGNOSIS — R52 Pain, unspecified: Secondary | ICD-10-CM | POA: Diagnosis not present

## 2022-02-16 DIAGNOSIS — F419 Anxiety disorder, unspecified: Secondary | ICD-10-CM | POA: Diagnosis not present

## 2022-02-16 DIAGNOSIS — M722 Plantar fascial fibromatosis: Secondary | ICD-10-CM | POA: Diagnosis not present

## 2022-02-16 DIAGNOSIS — Z23 Encounter for immunization: Secondary | ICD-10-CM | POA: Diagnosis not present

## 2022-02-16 DIAGNOSIS — Z1389 Encounter for screening for other disorder: Secondary | ICD-10-CM | POA: Diagnosis not present

## 2022-02-16 DIAGNOSIS — R82998 Other abnormal findings in urine: Secondary | ICD-10-CM | POA: Diagnosis not present

## 2022-02-23 DIAGNOSIS — F331 Major depressive disorder, recurrent, moderate: Secondary | ICD-10-CM | POA: Diagnosis not present

## 2022-02-24 DIAGNOSIS — F331 Major depressive disorder, recurrent, moderate: Secondary | ICD-10-CM | POA: Diagnosis not present

## 2022-03-02 DIAGNOSIS — F411 Generalized anxiety disorder: Secondary | ICD-10-CM | POA: Diagnosis not present

## 2022-03-04 DIAGNOSIS — F411 Generalized anxiety disorder: Secondary | ICD-10-CM | POA: Diagnosis not present

## 2022-03-31 ENCOUNTER — Other Ambulatory Visit (HOSPITAL_COMMUNITY): Payer: Self-pay

## 2022-03-31 MED ORDER — CLONAZEPAM 1 MG PO TBDP
1.5000 mg | ORAL_TABLET | Freq: Every day | ORAL | 1 refills | Status: AC
  Filled 2022-05-01: qty 180, 90d supply, fill #0

## 2022-04-01 ENCOUNTER — Other Ambulatory Visit (HOSPITAL_COMMUNITY): Payer: Self-pay

## 2022-04-06 IMAGING — CR DG LUMBAR SPINE COMPLETE 4+V
5 series · 5 of 5 positions shown · non-contrast
Comparison: May 03, 2014.

CLINICAL DATA: Chronic low back pain without known injury.

EXAM:
LUMBAR SPINE - COMPLETE 4+ VIEW

[t l-spine a.p.]
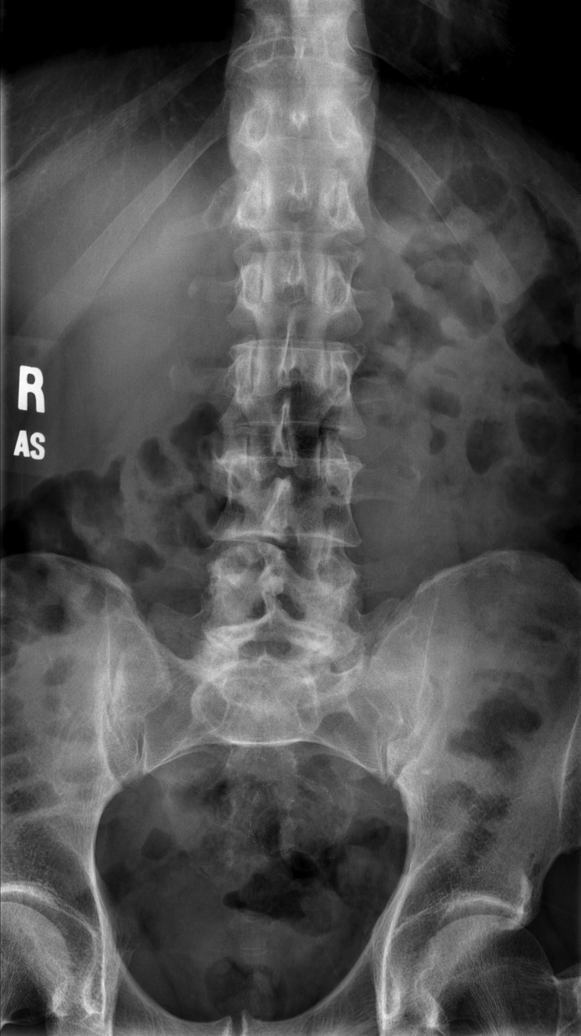

[t l-spine oblique exposure (1 of 2)]
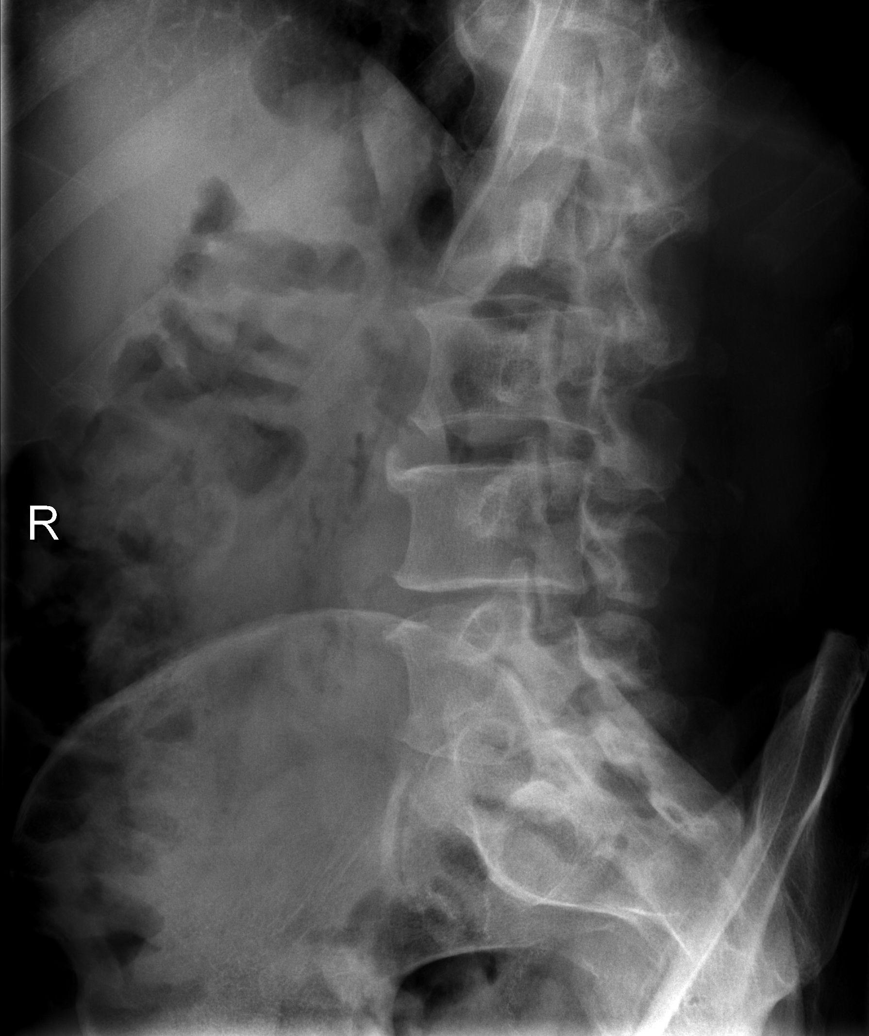

[t l-spine oblique exposure (2 of 2)]
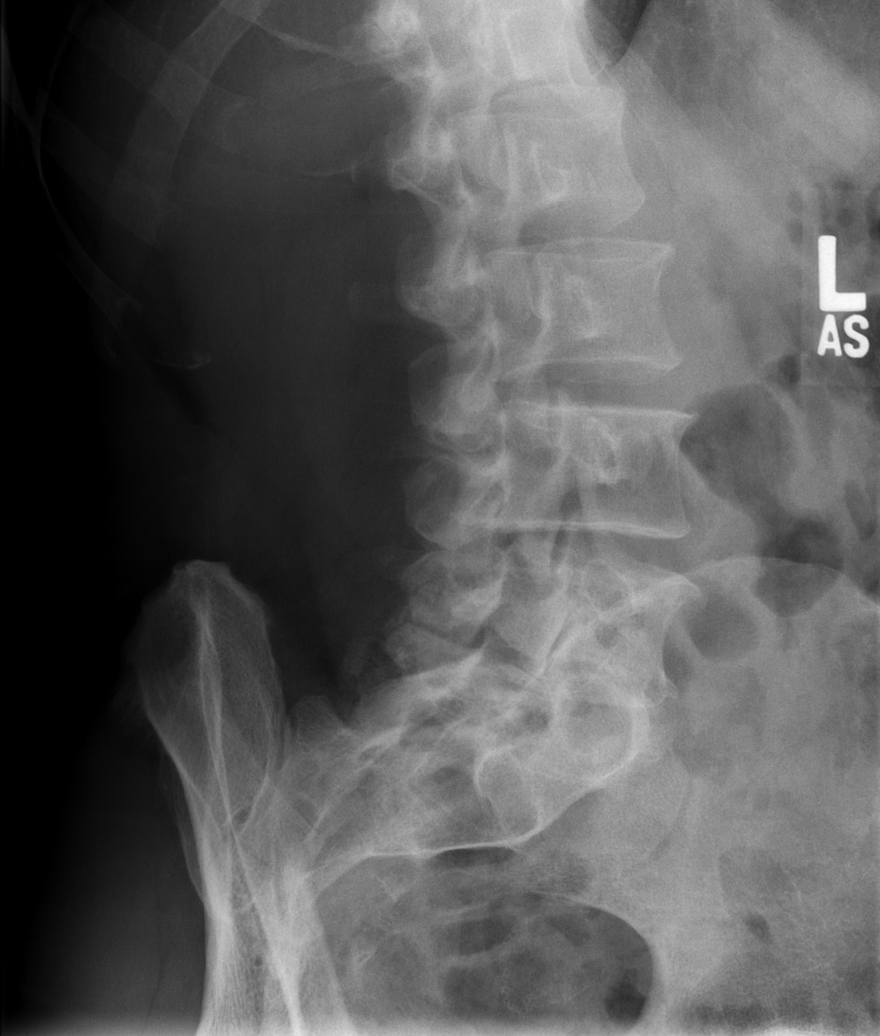

[t l-spine lat]
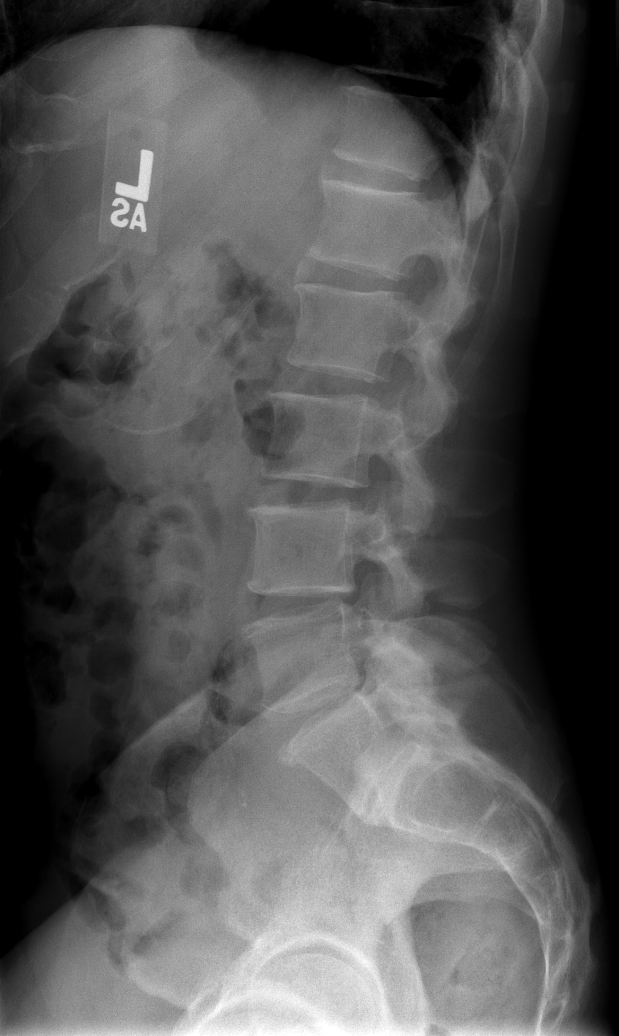

[t l-spine l5-s1 spot]
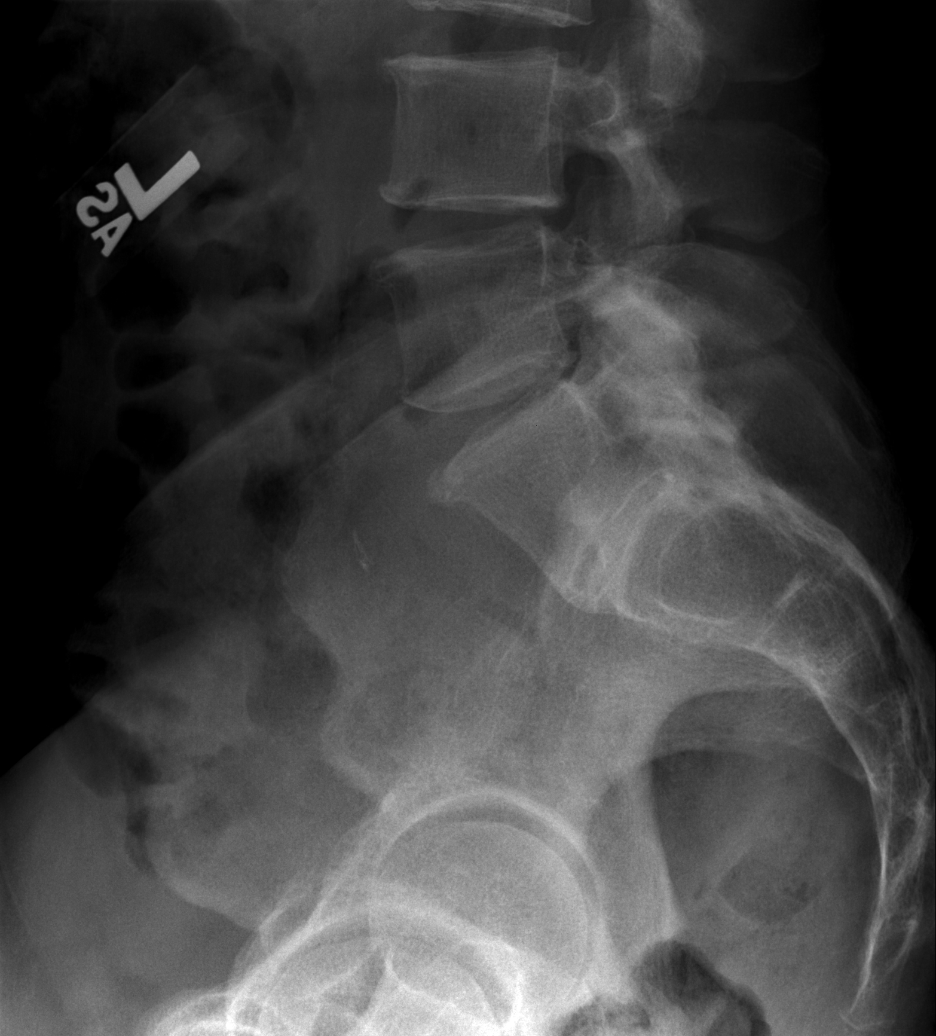

[5 of 5 positions shown; findings below may reference images not displayed]

FINDINGS: Stable minimal grade 1 retrolisthesis of L4-5 is noted secondary to
mild degenerative disc disease at this level. Anterior osteophyte
formation is also noted at L2-3 and L3-4. No fracture is noted.
IMPRESSION: Mild multilevel degenerative disc disease is noted. No acute
abnormality is noted.

## 2022-04-10 ENCOUNTER — Other Ambulatory Visit (HOSPITAL_COMMUNITY): Payer: Self-pay

## 2022-04-21 DIAGNOSIS — F411 Generalized anxiety disorder: Secondary | ICD-10-CM | POA: Diagnosis not present

## 2022-04-27 DIAGNOSIS — F411 Generalized anxiety disorder: Secondary | ICD-10-CM | POA: Diagnosis not present

## 2022-05-03 ENCOUNTER — Other Ambulatory Visit (HOSPITAL_COMMUNITY): Payer: Self-pay

## 2022-05-03 MED ORDER — CYCLOBENZAPRINE HCL 10 MG PO TABS
10.0000 mg | ORAL_TABLET | Freq: Three times a day (TID) | ORAL | 1 refills | Status: AC | PRN
  Filled 2022-05-03: qty 60, 20d supply, fill #0

## 2022-05-04 ENCOUNTER — Other Ambulatory Visit (HOSPITAL_COMMUNITY): Payer: Self-pay

## 2022-05-04 DIAGNOSIS — M532X7 Spinal instabilities, lumbosacral region: Secondary | ICD-10-CM | POA: Diagnosis not present

## 2022-05-04 DIAGNOSIS — M25651 Stiffness of right hip, not elsewhere classified: Secondary | ICD-10-CM | POA: Diagnosis not present

## 2022-05-04 DIAGNOSIS — E291 Testicular hypofunction: Secondary | ICD-10-CM | POA: Diagnosis not present

## 2022-05-04 DIAGNOSIS — M25551 Pain in right hip: Secondary | ICD-10-CM | POA: Diagnosis not present

## 2022-05-04 DIAGNOSIS — M25652 Stiffness of left hip, not elsewhere classified: Secondary | ICD-10-CM | POA: Diagnosis not present

## 2022-05-04 MED ORDER — CLOMIPHENE CITRATE 50 MG PO TABS
50.0000 mg | ORAL_TABLET | ORAL | 5 refills | Status: AC
  Filled 2022-05-04 – 2022-05-20 (×2): qty 12, 28d supply, fill #0
  Filled 2022-06-08 – 2022-06-15 (×2): qty 12, 28d supply, fill #1
  Filled 2022-07-10: qty 12, 28d supply, fill #2
  Filled 2022-08-02: qty 12, 28d supply, fill #3
  Filled 2022-09-05: qty 12, 28d supply, fill #4
  Filled 2022-10-03: qty 12, 28d supply, fill #5

## 2022-05-07 DIAGNOSIS — M532X7 Spinal instabilities, lumbosacral region: Secondary | ICD-10-CM | POA: Diagnosis not present

## 2022-05-07 DIAGNOSIS — M25652 Stiffness of left hip, not elsewhere classified: Secondary | ICD-10-CM | POA: Diagnosis not present

## 2022-05-07 DIAGNOSIS — M25651 Stiffness of right hip, not elsewhere classified: Secondary | ICD-10-CM | POA: Diagnosis not present

## 2022-05-07 DIAGNOSIS — M25551 Pain in right hip: Secondary | ICD-10-CM | POA: Diagnosis not present

## 2022-05-11 DIAGNOSIS — M532X7 Spinal instabilities, lumbosacral region: Secondary | ICD-10-CM | POA: Diagnosis not present

## 2022-05-11 DIAGNOSIS — M25551 Pain in right hip: Secondary | ICD-10-CM | POA: Diagnosis not present

## 2022-05-11 DIAGNOSIS — M25651 Stiffness of right hip, not elsewhere classified: Secondary | ICD-10-CM | POA: Diagnosis not present

## 2022-05-11 DIAGNOSIS — M25652 Stiffness of left hip, not elsewhere classified: Secondary | ICD-10-CM | POA: Diagnosis not present

## 2022-05-13 DIAGNOSIS — F411 Generalized anxiety disorder: Secondary | ICD-10-CM | POA: Diagnosis not present

## 2022-05-18 DIAGNOSIS — M25652 Stiffness of left hip, not elsewhere classified: Secondary | ICD-10-CM | POA: Diagnosis not present

## 2022-05-18 DIAGNOSIS — M532X7 Spinal instabilities, lumbosacral region: Secondary | ICD-10-CM | POA: Diagnosis not present

## 2022-05-18 DIAGNOSIS — M25551 Pain in right hip: Secondary | ICD-10-CM | POA: Diagnosis not present

## 2022-05-18 DIAGNOSIS — M25651 Stiffness of right hip, not elsewhere classified: Secondary | ICD-10-CM | POA: Diagnosis not present

## 2022-05-19 ENCOUNTER — Other Ambulatory Visit (HOSPITAL_COMMUNITY): Payer: Self-pay

## 2022-05-20 ENCOUNTER — Other Ambulatory Visit (HOSPITAL_COMMUNITY): Payer: Self-pay

## 2022-05-20 DIAGNOSIS — F411 Generalized anxiety disorder: Secondary | ICD-10-CM | POA: Diagnosis not present

## 2022-06-09 ENCOUNTER — Other Ambulatory Visit (HOSPITAL_COMMUNITY): Payer: Self-pay

## 2022-06-10 DIAGNOSIS — F411 Generalized anxiety disorder: Secondary | ICD-10-CM | POA: Diagnosis not present

## 2022-06-15 ENCOUNTER — Other Ambulatory Visit (HOSPITAL_COMMUNITY): Payer: Self-pay

## 2022-06-17 DIAGNOSIS — F411 Generalized anxiety disorder: Secondary | ICD-10-CM | POA: Diagnosis not present

## 2022-07-12 ENCOUNTER — Other Ambulatory Visit: Payer: Self-pay

## 2022-07-12 ENCOUNTER — Other Ambulatory Visit (HOSPITAL_COMMUNITY): Payer: Self-pay

## 2022-07-13 ENCOUNTER — Other Ambulatory Visit (HOSPITAL_COMMUNITY): Payer: Self-pay

## 2022-07-13 MED ORDER — CLONAZEPAM 1 MG PO TABS
0.5000 mg | ORAL_TABLET | Freq: Two times a day (BID) | ORAL | 0 refills | Status: AC
  Filled 2022-07-13: qty 180, 90d supply, fill #0

## 2022-07-14 ENCOUNTER — Other Ambulatory Visit (HOSPITAL_COMMUNITY): Payer: Self-pay

## 2022-07-14 DIAGNOSIS — E291 Testicular hypofunction: Secondary | ICD-10-CM | POA: Diagnosis not present

## 2022-07-19 ENCOUNTER — Other Ambulatory Visit (HOSPITAL_COMMUNITY): Payer: Self-pay

## 2022-07-19 DIAGNOSIS — M19112 Post-traumatic osteoarthritis, left shoulder: Secondary | ICD-10-CM | POA: Diagnosis not present

## 2022-07-19 MED ORDER — MELOXICAM 15 MG PO TABS
15.0000 mg | ORAL_TABLET | Freq: Every day | ORAL | 1 refills | Status: AC
  Filled 2022-07-19 – 2022-08-10 (×2): qty 30, 30d supply, fill #0

## 2022-07-27 DIAGNOSIS — M25512 Pain in left shoulder: Secondary | ICD-10-CM | POA: Diagnosis not present

## 2022-07-27 DIAGNOSIS — M7502 Adhesive capsulitis of left shoulder: Secondary | ICD-10-CM | POA: Diagnosis not present

## 2022-07-27 DIAGNOSIS — M25612 Stiffness of left shoulder, not elsewhere classified: Secondary | ICD-10-CM | POA: Diagnosis not present

## 2022-08-03 ENCOUNTER — Other Ambulatory Visit: Payer: Self-pay

## 2022-08-04 ENCOUNTER — Other Ambulatory Visit (HOSPITAL_COMMUNITY): Payer: Self-pay

## 2022-08-10 ENCOUNTER — Other Ambulatory Visit (HOSPITAL_COMMUNITY): Payer: Self-pay

## 2022-08-30 ENCOUNTER — Other Ambulatory Visit (HOSPITAL_COMMUNITY): Payer: Self-pay

## 2022-08-30 DIAGNOSIS — M25512 Pain in left shoulder: Secondary | ICD-10-CM | POA: Diagnosis not present

## 2022-08-30 DIAGNOSIS — F411 Generalized anxiety disorder: Secondary | ICD-10-CM | POA: Diagnosis not present

## 2022-08-30 MED ORDER — PAXLOVID (300/100) 20 X 150 MG & 10 X 100MG PO TBPK
3.0000 | ORAL_TABLET | Freq: Two times a day (BID) | ORAL | 0 refills | Status: AC
  Filled 2022-08-30: qty 30, 5d supply, fill #0

## 2022-09-05 ENCOUNTER — Other Ambulatory Visit (HOSPITAL_COMMUNITY): Payer: Self-pay

## 2022-09-06 ENCOUNTER — Other Ambulatory Visit (HOSPITAL_COMMUNITY): Payer: Self-pay

## 2022-09-16 ENCOUNTER — Other Ambulatory Visit (HOSPITAL_COMMUNITY): Payer: Self-pay

## 2022-09-16 MED ORDER — CLONAZEPAM 1 MG PO TABS
ORAL_TABLET | ORAL | 0 refills | Status: AC
  Filled 2022-09-16 – 2022-11-10 (×3): qty 180, 90d supply, fill #0

## 2022-09-29 DIAGNOSIS — F411 Generalized anxiety disorder: Secondary | ICD-10-CM | POA: Diagnosis not present

## 2022-10-03 ENCOUNTER — Other Ambulatory Visit (HOSPITAL_COMMUNITY): Payer: Self-pay

## 2022-10-06 ENCOUNTER — Other Ambulatory Visit: Payer: Self-pay

## 2022-10-07 ENCOUNTER — Other Ambulatory Visit: Payer: Self-pay

## 2022-10-12 ENCOUNTER — Other Ambulatory Visit (HOSPITAL_COMMUNITY): Payer: Self-pay

## 2022-10-20 DIAGNOSIS — F411 Generalized anxiety disorder: Secondary | ICD-10-CM | POA: Diagnosis not present

## 2022-10-27 ENCOUNTER — Other Ambulatory Visit (HOSPITAL_COMMUNITY): Payer: Self-pay

## 2022-10-28 ENCOUNTER — Other Ambulatory Visit (HOSPITAL_COMMUNITY): Payer: Self-pay

## 2022-10-28 MED ORDER — CLONAZEPAM 0.5 MG PO TABS
ORAL_TABLET | ORAL | 0 refills | Status: AC
  Filled 2022-10-28: qty 63, 42d supply, fill #0

## 2022-11-02 ENCOUNTER — Other Ambulatory Visit (HOSPITAL_COMMUNITY): Payer: Self-pay

## 2022-11-04 ENCOUNTER — Other Ambulatory Visit (HOSPITAL_COMMUNITY): Payer: Self-pay

## 2022-11-10 ENCOUNTER — Other Ambulatory Visit (HOSPITAL_COMMUNITY): Payer: Self-pay

## 2022-11-10 MED ORDER — CLOMIPHENE CITRATE 50 MG PO TABS: 50.0000 mg | ORAL_TABLET | ORAL | 11 refills | Status: AC

## 2022-11-11 ENCOUNTER — Other Ambulatory Visit (HOSPITAL_COMMUNITY): Payer: Self-pay

## 2022-11-29 ENCOUNTER — Other Ambulatory Visit: Payer: Self-pay

## 2022-11-30 ENCOUNTER — Other Ambulatory Visit (HOSPITAL_COMMUNITY): Payer: Self-pay

## 2022-11-30 MED ORDER — CLOMIPHENE CITRATE 50 MG PO TABS
50.0000 mg | ORAL_TABLET | ORAL | 11 refills | Status: AC
  Filled 2022-12-16: qty 12, 28d supply, fill #0
  Filled 2023-01-17: qty 12, 28d supply, fill #1
  Filled 2023-02-20: qty 12, 28d supply, fill #2
  Filled 2023-03-28: qty 12, 28d supply, fill #3
  Filled 2023-04-29: qty 12, 28d supply, fill #4
  Filled 2023-05-31: qty 12, 28d supply, fill #5
  Filled 2023-07-02: qty 12, 28d supply, fill #6
  Filled 2023-07-28: qty 12, 28d supply, fill #7
  Filled 2023-09-12: qty 12, 28d supply, fill #8

## 2022-12-13 ENCOUNTER — Other Ambulatory Visit (HOSPITAL_COMMUNITY): Payer: Self-pay

## 2022-12-15 DIAGNOSIS — F411 Generalized anxiety disorder: Secondary | ICD-10-CM | POA: Diagnosis not present

## 2022-12-16 ENCOUNTER — Other Ambulatory Visit (HOSPITAL_COMMUNITY): Payer: Self-pay

## 2023-01-05 DIAGNOSIS — F411 Generalized anxiety disorder: Secondary | ICD-10-CM | POA: Diagnosis not present

## 2023-01-17 ENCOUNTER — Other Ambulatory Visit (HOSPITAL_COMMUNITY): Payer: Self-pay

## 2023-02-23 DIAGNOSIS — F330 Major depressive disorder, recurrent, mild: Secondary | ICD-10-CM | POA: Diagnosis not present

## 2023-03-03 ENCOUNTER — Other Ambulatory Visit (HOSPITAL_COMMUNITY): Payer: Self-pay

## 2023-03-08 ENCOUNTER — Other Ambulatory Visit (HOSPITAL_COMMUNITY): Payer: Self-pay

## 2023-03-08 MED ORDER — CLONAZEPAM 1 MG PO TABS
1.5000 mg | ORAL_TABLET | Freq: Every evening | ORAL | 1 refills | Status: AC
  Filled 2023-03-08: qty 83, 41d supply, fill #0
  Filled 2023-03-08: qty 97, 49d supply, fill #0
  Filled 2023-06-13: qty 180, 90d supply, fill #1

## 2023-03-28 ENCOUNTER — Other Ambulatory Visit (HOSPITAL_COMMUNITY): Payer: Self-pay

## 2023-03-29 DIAGNOSIS — E291 Testicular hypofunction: Secondary | ICD-10-CM | POA: Diagnosis not present

## 2023-03-29 DIAGNOSIS — Z1212 Encounter for screening for malignant neoplasm of rectum: Secondary | ICD-10-CM | POA: Diagnosis not present

## 2023-03-29 DIAGNOSIS — F419 Anxiety disorder, unspecified: Secondary | ICD-10-CM | POA: Diagnosis not present

## 2023-03-30 DIAGNOSIS — Z125 Encounter for screening for malignant neoplasm of prostate: Secondary | ICD-10-CM | POA: Diagnosis not present

## 2023-03-30 DIAGNOSIS — R7989 Other specified abnormal findings of blood chemistry: Secondary | ICD-10-CM | POA: Diagnosis not present

## 2023-03-30 DIAGNOSIS — M5136 Other intervertebral disc degeneration, lumbar region: Secondary | ICD-10-CM | POA: Diagnosis not present

## 2023-03-30 DIAGNOSIS — E785 Hyperlipidemia, unspecified: Secondary | ICD-10-CM | POA: Diagnosis not present

## 2023-04-05 ENCOUNTER — Other Ambulatory Visit (HOSPITAL_COMMUNITY): Payer: Self-pay

## 2023-04-05 DIAGNOSIS — R52 Pain, unspecified: Secondary | ICD-10-CM | POA: Diagnosis not present

## 2023-04-05 DIAGNOSIS — D539 Nutritional anemia, unspecified: Secondary | ICD-10-CM | POA: Diagnosis not present

## 2023-04-05 DIAGNOSIS — E785 Hyperlipidemia, unspecified: Secondary | ICD-10-CM | POA: Diagnosis not present

## 2023-04-05 DIAGNOSIS — R82998 Other abnormal findings in urine: Secondary | ICD-10-CM | POA: Diagnosis not present

## 2023-04-05 DIAGNOSIS — M722 Plantar fascial fibromatosis: Secondary | ICD-10-CM | POA: Diagnosis not present

## 2023-04-05 DIAGNOSIS — Z1339 Encounter for screening examination for other mental health and behavioral disorders: Secondary | ICD-10-CM | POA: Diagnosis not present

## 2023-04-05 DIAGNOSIS — N529 Male erectile dysfunction, unspecified: Secondary | ICD-10-CM | POA: Diagnosis not present

## 2023-04-05 DIAGNOSIS — E291 Testicular hypofunction: Secondary | ICD-10-CM | POA: Diagnosis not present

## 2023-04-05 DIAGNOSIS — M5136 Other intervertebral disc degeneration, lumbar region: Secondary | ICD-10-CM | POA: Diagnosis not present

## 2023-04-05 DIAGNOSIS — F419 Anxiety disorder, unspecified: Secondary | ICD-10-CM | POA: Diagnosis not present

## 2023-04-05 DIAGNOSIS — Z0000 Encounter for general adult medical examination without abnormal findings: Secondary | ICD-10-CM | POA: Diagnosis not present

## 2023-04-05 MED ORDER — SILDENAFIL CITRATE 100 MG PO TABS
ORAL_TABLET | ORAL | 5 refills | Status: AC
  Filled 2023-04-05 – 2023-06-03 (×2): qty 6, 30d supply, fill #0

## 2023-04-08 DIAGNOSIS — H52223 Regular astigmatism, bilateral: Secondary | ICD-10-CM | POA: Diagnosis not present

## 2023-04-15 ENCOUNTER — Other Ambulatory Visit (HOSPITAL_COMMUNITY): Payer: Self-pay

## 2023-05-03 ENCOUNTER — Other Ambulatory Visit (HOSPITAL_COMMUNITY): Payer: Self-pay

## 2023-05-03 DIAGNOSIS — E291 Testicular hypofunction: Secondary | ICD-10-CM | POA: Diagnosis not present

## 2023-05-03 MED ORDER — CLOMIPHENE CITRATE 50 MG PO TABS
50.0000 mg | ORAL_TABLET | ORAL | 5 refills | Status: AC
  Filled 2023-05-03 – 2023-10-29 (×3): qty 36, 84d supply, fill #0
  Filled 2024-01-23: qty 36, 84d supply, fill #1
  Filled 2024-04-22 – 2024-05-01 (×3): qty 36, 84d supply, fill #2

## 2023-05-04 ENCOUNTER — Other Ambulatory Visit (HOSPITAL_COMMUNITY): Payer: Self-pay

## 2023-05-05 ENCOUNTER — Other Ambulatory Visit (HOSPITAL_COMMUNITY): Payer: Self-pay

## 2023-05-05 MED ORDER — CLONAZEPAM 1 MG PO TABS
1.5000 mg | ORAL_TABLET | Freq: Every day | ORAL | 0 refills | Status: AC
  Filled 2023-05-05: qty 180, 90d supply, fill #0

## 2023-05-31 ENCOUNTER — Other Ambulatory Visit (HOSPITAL_COMMUNITY): Payer: Self-pay

## 2023-06-03 ENCOUNTER — Other Ambulatory Visit (HOSPITAL_COMMUNITY): Payer: Self-pay

## 2023-06-14 ENCOUNTER — Other Ambulatory Visit: Payer: Self-pay

## 2023-06-14 ENCOUNTER — Other Ambulatory Visit (HOSPITAL_COMMUNITY): Payer: Self-pay

## 2023-06-16 ENCOUNTER — Other Ambulatory Visit (HOSPITAL_COMMUNITY): Payer: Self-pay

## 2023-06-16 DIAGNOSIS — H16223 Keratoconjunctivitis sicca, not specified as Sjogren's, bilateral: Secondary | ICD-10-CM | POA: Diagnosis not present

## 2023-06-16 MED ORDER — LOTEMAX SM 0.38 % OP GEL
OPHTHALMIC | 1 refills | Status: AC
  Filled 2023-06-16 – 2023-06-20 (×2): qty 5, 14d supply, fill #0

## 2023-06-17 ENCOUNTER — Other Ambulatory Visit (HOSPITAL_COMMUNITY): Payer: Self-pay

## 2023-06-20 ENCOUNTER — Other Ambulatory Visit (HOSPITAL_COMMUNITY): Payer: Self-pay

## 2023-06-22 DIAGNOSIS — F411 Generalized anxiety disorder: Secondary | ICD-10-CM | POA: Diagnosis not present

## 2023-06-27 ENCOUNTER — Other Ambulatory Visit (HOSPITAL_COMMUNITY): Payer: Self-pay

## 2023-07-02 ENCOUNTER — Other Ambulatory Visit (HOSPITAL_COMMUNITY): Payer: Self-pay

## 2023-07-29 ENCOUNTER — Other Ambulatory Visit: Payer: Self-pay

## 2023-08-04 ENCOUNTER — Other Ambulatory Visit (HOSPITAL_COMMUNITY): Payer: Self-pay

## 2023-08-04 MED ORDER — ZOSTER VAC RECOMB ADJUVANTED 50 MCG/0.5ML IM SUSR
0.5000 mL | Freq: Once | INTRAMUSCULAR | 0 refills | Status: AC
Start: 2023-08-04 — End: 2023-08-05
  Filled 2023-08-04: qty 0.5, 1d supply, fill #0

## 2023-09-05 ENCOUNTER — Other Ambulatory Visit (HOSPITAL_COMMUNITY): Payer: Self-pay

## 2023-09-05 MED ORDER — CLONAZEPAM 1 MG PO TABS
1.5000 mg | ORAL_TABLET | Freq: Every day | ORAL | 1 refills | Status: AC
  Filled 2023-09-05: qty 180, 90d supply, fill #0
  Filled 2023-12-06: qty 180, 90d supply, fill #1

## 2023-09-06 ENCOUNTER — Other Ambulatory Visit (HOSPITAL_COMMUNITY): Payer: Self-pay

## 2023-09-12 ENCOUNTER — Other Ambulatory Visit (HOSPITAL_COMMUNITY): Payer: Self-pay

## 2023-09-13 DIAGNOSIS — F411 Generalized anxiety disorder: Secondary | ICD-10-CM | POA: Diagnosis not present

## 2023-09-21 ENCOUNTER — Other Ambulatory Visit (HOSPITAL_COMMUNITY): Payer: Self-pay

## 2023-10-07 ENCOUNTER — Other Ambulatory Visit (HOSPITAL_COMMUNITY): Payer: Self-pay

## 2023-10-17 ENCOUNTER — Other Ambulatory Visit (HOSPITAL_COMMUNITY): Payer: Self-pay

## 2023-10-29 ENCOUNTER — Other Ambulatory Visit (HOSPITAL_COMMUNITY): Payer: Self-pay

## 2023-12-07 ENCOUNTER — Other Ambulatory Visit: Payer: Self-pay

## 2023-12-09 ENCOUNTER — Other Ambulatory Visit (HOSPITAL_COMMUNITY): Payer: Self-pay

## 2023-12-09 MED ORDER — ZOSTER VAC RECOMB ADJUVANTED 50 MCG/0.5ML IM SUSR
INTRAMUSCULAR | 0 refills | Status: AC
  Filled 2023-12-09: qty 1, 30d supply, fill #0

## 2023-12-13 ENCOUNTER — Other Ambulatory Visit (HOSPITAL_COMMUNITY): Payer: Self-pay

## 2023-12-28 ENCOUNTER — Other Ambulatory Visit (HOSPITAL_COMMUNITY): Payer: Self-pay

## 2024-01-04 DIAGNOSIS — F411 Generalized anxiety disorder: Secondary | ICD-10-CM | POA: Diagnosis not present

## 2024-01-24 ENCOUNTER — Other Ambulatory Visit (HOSPITAL_BASED_OUTPATIENT_CLINIC_OR_DEPARTMENT_OTHER): Payer: Self-pay

## 2024-01-31 DIAGNOSIS — H0288B Meibomian gland dysfunction left eye, upper and lower eyelids: Secondary | ICD-10-CM | POA: Diagnosis not present

## 2024-01-31 DIAGNOSIS — H16223 Keratoconjunctivitis sicca, not specified as Sjogren's, bilateral: Secondary | ICD-10-CM | POA: Diagnosis not present

## 2024-01-31 DIAGNOSIS — H0288A Meibomian gland dysfunction right eye, upper and lower eyelids: Secondary | ICD-10-CM | POA: Diagnosis not present

## 2024-03-03 ENCOUNTER — Other Ambulatory Visit (HOSPITAL_COMMUNITY): Payer: Self-pay

## 2024-03-05 ENCOUNTER — Other Ambulatory Visit (HOSPITAL_COMMUNITY): Payer: Self-pay

## 2024-03-05 ENCOUNTER — Other Ambulatory Visit: Payer: Self-pay

## 2024-03-05 MED ORDER — CLONAZEPAM 1 MG PO TABS
1.5000 mg | ORAL_TABLET | Freq: Every day | ORAL | 1 refills | Status: AC
  Filled 2024-03-05 – 2024-03-07 (×2): qty 180, 90d supply, fill #0
  Filled 2024-06-29: qty 180, 90d supply, fill #1

## 2024-03-06 ENCOUNTER — Other Ambulatory Visit (HOSPITAL_COMMUNITY): Payer: Self-pay

## 2024-03-07 ENCOUNTER — Other Ambulatory Visit: Payer: Self-pay

## 2024-03-07 ENCOUNTER — Other Ambulatory Visit (HOSPITAL_COMMUNITY): Payer: Self-pay

## 2024-03-07 DIAGNOSIS — M19212 Secondary osteoarthritis, left shoulder: Secondary | ICD-10-CM | POA: Diagnosis not present

## 2024-03-09 ENCOUNTER — Other Ambulatory Visit (HOSPITAL_COMMUNITY): Payer: Self-pay

## 2024-03-13 ENCOUNTER — Ambulatory Visit
Admission: RE | Admit: 2024-03-13 | Discharge: 2024-03-13 | Disposition: A | Discharge status: Home / Self Care | Source: Ambulatory Visit | Attending: Internal Medicine | Admitting: Internal Medicine

## 2024-03-13 ENCOUNTER — Other Ambulatory Visit: Payer: Self-pay | Admitting: Internal Medicine

## 2024-03-13 DIAGNOSIS — M25552 Pain in left hip: Secondary | ICD-10-CM

## 2024-03-13 DIAGNOSIS — M25551 Pain in right hip: Secondary | ICD-10-CM | POA: Diagnosis not present

## 2024-03-14 ENCOUNTER — Other Ambulatory Visit: Payer: Self-pay | Admitting: Family

## 2024-03-19 ENCOUNTER — Other Ambulatory Visit: Payer: Self-pay | Admitting: Family

## 2024-03-19 MED ORDER — COVID-19 MRNA VAC-TRIS(PFIZER) 30 MCG/0.3ML IM SUSY: 0.3000 mL | PREFILLED_SYRINGE | Freq: Once | INTRAMUSCULAR | 0 refills | Status: AC

## 2024-03-20 DIAGNOSIS — Z23 Encounter for immunization: Secondary | ICD-10-CM | POA: Diagnosis not present

## 2024-04-10 DIAGNOSIS — H524 Presbyopia: Secondary | ICD-10-CM | POA: Diagnosis not present

## 2024-04-10 DIAGNOSIS — H52223 Regular astigmatism, bilateral: Secondary | ICD-10-CM | POA: Diagnosis not present

## 2024-04-10 DIAGNOSIS — H5203 Hypermetropia, bilateral: Secondary | ICD-10-CM | POA: Diagnosis not present

## 2024-04-23 ENCOUNTER — Encounter: Payer: Self-pay | Admitting: Pharmacist

## 2024-04-23 ENCOUNTER — Other Ambulatory Visit: Payer: Self-pay

## 2024-04-23 ENCOUNTER — Other Ambulatory Visit (HOSPITAL_COMMUNITY): Payer: Self-pay

## 2024-04-26 ENCOUNTER — Other Ambulatory Visit: Payer: Self-pay

## 2024-05-01 ENCOUNTER — Other Ambulatory Visit (HOSPITAL_COMMUNITY): Payer: Self-pay

## 2024-05-08 DIAGNOSIS — E291 Testicular hypofunction: Secondary | ICD-10-CM | POA: Diagnosis not present

## 2024-06-05 DIAGNOSIS — D531 Other megaloblastic anemias, not elsewhere classified: Secondary | ICD-10-CM | POA: Diagnosis not present

## 2024-06-05 DIAGNOSIS — Z1212 Encounter for screening for malignant neoplasm of rectum: Secondary | ICD-10-CM | POA: Diagnosis not present

## 2024-06-05 DIAGNOSIS — E291 Testicular hypofunction: Secondary | ICD-10-CM | POA: Diagnosis not present

## 2024-06-12 DIAGNOSIS — R82998 Other abnormal findings in urine: Secondary | ICD-10-CM | POA: Diagnosis not present

## 2024-06-19 NOTE — Progress Notes (Unsigned)
 [name] [name] D.[name] [name] [name] [address] [place] [address] Phone: [phone]   Assessment and Plan:     ***    Pertinent previous records reviewed include ***   Follow Up: ***     Subjective:   I, [name] [name], am serving as a neurosurgeon for Doctor Morene Mace  Chief Complaint: right hip pain   HPI:   06/20/2024 Patient is a 58 year old male with right hip pain. Patient states   Relevant Historical Information: ***  Additional pertinent review of systems negative.   Current Outpatient Medications:    Ascorbic Acid (VITAMIN C) 1000 MG tablet, Take 1,000 mg by mouth daily., Disp: , Rfl:    Cholecalciferol (VITAMIN D PO), Take 25,000 mcg by mouth daily., Disp: , Rfl:    clomiPHENE  (CLOMID ) 50 MG tablet, Take 1 tablet (50 mg total) by mouth 3 (three) times a week., Disp: 12 tablet, Rfl: 5   clomiPHENE  (CLOMID ) 50 MG tablet, Take 1 tablet (50 mg total) by mouth 3 days a week, Disp: 12 tablet, Rfl: 11   clomiPHENE  (CLOMID ) 50 MG tablet, Take 1 tablet (50 mg) by mouth 3 times a week, Disp: 12 tablet, Rfl: 11   clomiPHENE  (CLOMID ) 50 MG tablet, Take 1 tablet (50 mg total) by mouth 3 (three) times a week., Disp: 36 tablet, Rfl: 5   clonazePAM  (KLONOPIN ) 0.5 MG tablet, Take 1 and  3/4 tablets for 14 days, THEN take 1 and 1/2 tablets for 14 days, THEN take 1 and 1/4 tablets for 14 days, Disp: 63 tablet, Rfl: 0   clonazePAM  (KLONOPIN ) 1 MG disintegrating tablet, Dissolve 1&1/2 tablets (1.5 mg total) by mouth at bedtime and 1/2 tablet during the day if needed., Disp: 180 tablet, Rfl: 1   clonazePAM  (KLONOPIN ) 1 MG tablet, Take 1.5 tablets (1.5 mg total) by mouth at bedtime. May also take 0.5 tablets daily as needed., Disp: 180 tablet, Rfl: 0   clonazePAM  (KLONOPIN ) 1 MG tablet, Take 1.5 tablets (1.5 mg total) by mouth at bedtime. May take an additional 0.5 tablet (0.5 mg) daily as needed., Disp: 180 tablet, Rfl: 1   clonazePAM  (KLONOPIN )  1 MG tablet, TAKE 1 AND 1/2 TABLETS BY MOUTH AT BEDTIME AND TAKE 1/2 TABLET BY MOUTH DURING THE DAY AS NEEDED.., Disp: 180 tablet, Rfl: 0   clonazePAM  (KLONOPIN ) 1 MG tablet, TAKE 1 AND 1/2 TABLETS BY MOUTH AT BEDTIME AND TAKE 1/2 TABLET BY MOUTH DURING THE DAY AS NEEDED., Disp: 180 tablet, Rfl: 0   clonazePAM  (KLONOPIN ) 1 MG tablet, Take 1 and 1/2 tablets by mouth at bedtime and 1/2 tablet during the day if needed., Disp: 180 tablet, Rfl: 1   clonazePAM  (KLONOPIN ) 1 MG tablet, Take 1 and 1/2 tablets by mouth at bedtime and 1/2 tablet during the day if needed, Disp: 180 tablet, Rfl: 0   clonazePAM  (KLONOPIN ) 1 MG tablet, Take 1 and 1/2 tablets (1.5 mg) by mouth daily at bedtime and 1/2 tablet (0.5 mg) during the day if needed., Disp: 180 tablet, Rfl: 0   clonazePAM  (KLONOPIN ) 1 MG tablet, Take 1.5 tablets (1.5 mg total) by mouth at bedtime. May also take 0.5 tablets (0.5 mg total) daily as needed., Disp: 180 tablet, Rfl: 0   clonazePAM  (KLONOPIN ) 1 MG tablet, Take 1.5 tablets (1.5 mg total) by mouth at bedtime and 0.5 tablet during the day if needed, Disp: 180 tablet, Rfl: 1   clonazePAM  (KLONOPIN ) 1 MG tablet, Take 1.5 tablets (  1.5 mg total) by mouth at bedtime. May also take 0.5 tablet daily as needed, Disp: 180 tablet, Rfl: 1   Cyanocobalamin  (VITAMIN B-12 PO), Take 2,000 mcg by mouth daily., Disp: , Rfl:    cyclobenzaprine  (FLEXERIL ) 10 MG tablet, Take 1 tablet by mouth three times a day as needed for pain., Disp: 30 tablet, Rfl: 0   cyclobenzaprine  (FLEXERIL ) 10 MG tablet, Take 1 tablet (10 mg total) by mouth every 8 (eight) hours as needed for muscle pain or spasm, Disp: 60 tablet, Rfl: 1   Loteprednol  Etabonate (LOTEMAX  SM) 0.38 % GEL, Instill 1 drop into both eyes 4 times a day for 2 weeks then 2 times a day for 4 weeks, Disp: 5 g, Rfl: 1   meloxicam  (MOBIC ) 15 MG tablet, Take 1 tablet (15 mg total) by mouth once a day as directed., Disp: 30 tablet, Rfl: 1   Multiple Vitamin (MULTIVITAMIN)  tablet, Take 1 tablet by mouth daily., Disp: , Rfl:    nirmatrelvir  & ritonavir  (PAXLOVID , 300/100,) 20 x 150 MG & 10 x 100MG  TBPK, Take 3 tablets by mouth 2 times daily., Disp: 30 each, Rfl: 0   sildenafil  (VIAGRA ) 100 MG tablet, Take 1 tablet by mouth as needed taken 30 min to 4 hours prior to sexual activity, Disp: 30 tablet, Rfl: 5   traMADol  (ULTRAM ) 50 MG tablet, Take by mouth every 6 (six) hours as needed., Disp: , Rfl:    triamcinolone  cream (KENALOG ) 0.5 %, Apply 1 application externally up to three times a day. Avoid face or genital areas. 90 days, Disp: 30 g, Rfl: 3   Zoster Vaccine Adjuvanted (SHINGRIX ) injection, Inject into the muscle., Disp: 1 each, Rfl: 0   Objective:     There were no vitals filed for this visit.    There is no height or weight on file to calculate BMI.    Physical Exam:    ***   Electronically signed by:  Odis Mace D.[name] [name] [name] 9:34 AM 06/19/24

## 2024-06-20 ENCOUNTER — Ambulatory Visit: Admitting: Sports Medicine

## 2024-06-20 VITALS — BP 118/80 | HR 72 | Ht 70.0 in | Wt 173.0 lb

## 2024-06-20 DIAGNOSIS — M7061 Trochanteric bursitis, right hip: Secondary | ICD-10-CM

## 2024-06-20 NOTE — Patient Instructions (Addendum)
 Hip HEP   PT referral   Voltaren gel over areas of pain   As needed follow up

## 2024-06-29 ENCOUNTER — Other Ambulatory Visit: Payer: Self-pay

## 2024-07-03 ENCOUNTER — Other Ambulatory Visit (HOSPITAL_COMMUNITY): Payer: Self-pay

## 2024-07-03 MED ORDER — OSELTAMIVIR PHOSPHATE 75 MG PO CAPS
75.0000 mg | ORAL_CAPSULE | Freq: Every day | ORAL | 1 refills | Status: AC
  Filled 2024-07-03: qty 10, 10d supply, fill #0
  Filled 2024-07-09 – 2024-07-11 (×3): qty 10, 10d supply, fill #1

## 2024-07-09 ENCOUNTER — Other Ambulatory Visit (HOSPITAL_COMMUNITY): Payer: Self-pay

## 2024-07-10 ENCOUNTER — Other Ambulatory Visit (HOSPITAL_BASED_OUTPATIENT_CLINIC_OR_DEPARTMENT_OTHER): Payer: Self-pay

## 2024-07-11 ENCOUNTER — Other Ambulatory Visit: Payer: Self-pay

## 2024-07-11 ENCOUNTER — Other Ambulatory Visit (HOSPITAL_COMMUNITY): Payer: Self-pay
